# Patient Record
Sex: Male | Born: 1955 | Race: Black or African American | Hispanic: No | Smoking: Former smoker
Health system: Southern US, Community
[De-identification: ages and names within clinical notes are randomized; demographics above are authoritative.]

## PROBLEM LIST (undated history)

## (undated) DIAGNOSIS — J9600 Acute respiratory failure, unspecified whether with hypoxia or hypercapnia: Secondary | ICD-10-CM

## (undated) DIAGNOSIS — I609 Nontraumatic subarachnoid hemorrhage, unspecified: Secondary | ICD-10-CM

## (undated) DIAGNOSIS — F329 Major depressive disorder, single episode, unspecified: Secondary | ICD-10-CM

## (undated) DIAGNOSIS — I639 Cerebral infarction, unspecified: Secondary | ICD-10-CM

## (undated) DIAGNOSIS — F319 Bipolar disorder, unspecified: Secondary | ICD-10-CM

## (undated) DIAGNOSIS — G8929 Other chronic pain: Secondary | ICD-10-CM

## (undated) DIAGNOSIS — E871 Hypo-osmolality and hyponatremia: Secondary | ICD-10-CM

## (undated) DIAGNOSIS — F209 Schizophrenia, unspecified: Secondary | ICD-10-CM

## (undated) DIAGNOSIS — I10 Essential (primary) hypertension: Secondary | ICD-10-CM

## (undated) DIAGNOSIS — I219 Acute myocardial infarction, unspecified: Secondary | ICD-10-CM

## (undated) DIAGNOSIS — R7881 Bacteremia: Secondary | ICD-10-CM

## (undated) HISTORY — PX: CORONARY ANGIOPLASTY WITH STENT PLACEMENT: SHX49

## (undated) HISTORY — PX: KNEE SURGERY: SHX244

---

## 1998-08-19 ENCOUNTER — Emergency Department (HOSPITAL_COMMUNITY): Admission: EM | Admit: 1998-08-19 | Discharge: 1998-08-19 | Payer: Self-pay | Admitting: Emergency Medicine

## 2002-02-24 ENCOUNTER — Emergency Department (HOSPITAL_COMMUNITY): Admission: EM | Admit: 2002-02-24 | Discharge: 2002-02-25 | Payer: Self-pay

## 2002-03-01 ENCOUNTER — Encounter: Payer: Self-pay | Admitting: *Deleted

## 2002-03-01 ENCOUNTER — Emergency Department (HOSPITAL_COMMUNITY): Admission: EM | Admit: 2002-03-01 | Discharge: 2002-03-01 | Payer: Self-pay | Admitting: *Deleted

## 2002-03-18 ENCOUNTER — Emergency Department (HOSPITAL_COMMUNITY): Admission: EM | Admit: 2002-03-18 | Discharge: 2002-03-18 | Payer: Self-pay | Admitting: Emergency Medicine

## 2002-10-29 ENCOUNTER — Emergency Department (HOSPITAL_COMMUNITY): Admission: EM | Admit: 2002-10-29 | Discharge: 2002-10-29 | Payer: Self-pay | Admitting: *Deleted

## 2002-11-29 ENCOUNTER — Inpatient Hospital Stay (HOSPITAL_COMMUNITY): Admission: EM | Admit: 2002-11-29 | Discharge: 2002-11-30 | Payer: Self-pay

## 2003-01-06 ENCOUNTER — Emergency Department (HOSPITAL_COMMUNITY): Admission: EM | Admit: 2003-01-06 | Discharge: 2003-01-06 | Payer: Self-pay | Admitting: *Deleted

## 2003-03-03 ENCOUNTER — Observation Stay (HOSPITAL_COMMUNITY): Admission: EM | Admit: 2003-03-03 | Discharge: 2003-03-04 | Payer: Self-pay | Admitting: Emergency Medicine

## 2003-04-09 ENCOUNTER — Encounter: Payer: Self-pay | Admitting: Emergency Medicine

## 2003-04-10 ENCOUNTER — Inpatient Hospital Stay (HOSPITAL_COMMUNITY): Admission: EM | Admit: 2003-04-10 | Discharge: 2003-04-16 | Payer: Self-pay | Admitting: Psychiatry

## 2003-05-02 ENCOUNTER — Emergency Department (HOSPITAL_COMMUNITY): Admission: EM | Admit: 2003-05-02 | Discharge: 2003-05-02 | Payer: Self-pay | Admitting: Emergency Medicine

## 2003-06-25 ENCOUNTER — Emergency Department (HOSPITAL_COMMUNITY): Admission: EM | Admit: 2003-06-25 | Discharge: 2003-06-25 | Payer: Self-pay | Admitting: Emergency Medicine

## 2003-09-28 ENCOUNTER — Emergency Department (HOSPITAL_COMMUNITY): Admission: EM | Admit: 2003-09-28 | Discharge: 2003-09-28 | Payer: Self-pay | Admitting: Emergency Medicine

## 2003-10-02 ENCOUNTER — Emergency Department (HOSPITAL_COMMUNITY): Admission: EM | Admit: 2003-10-02 | Discharge: 2003-10-02 | Payer: Self-pay | Admitting: *Deleted

## 2011-07-30 ENCOUNTER — Ambulatory Visit (INDEPENDENT_AMBULATORY_CARE_PROVIDER_SITE_OTHER): Payer: Self-pay

## 2011-07-30 ENCOUNTER — Emergency Department (HOSPITAL_COMMUNITY)
Admission: EM | Admit: 2011-07-30 | Discharge: 2011-07-30 | Disposition: A | Discharge status: Home / Self Care | Payer: Medicaid Other | Attending: Emergency Medicine | Admitting: Emergency Medicine

## 2011-07-30 ENCOUNTER — Inpatient Hospital Stay (INDEPENDENT_AMBULATORY_CARE_PROVIDER_SITE_OTHER)
Admission: RE | Admit: 2011-07-30 | Discharge: 2011-07-30 | Disposition: A | Discharge status: Home / Self Care | Payer: Self-pay | Source: Ambulatory Visit | Attending: Emergency Medicine | Admitting: Emergency Medicine

## 2011-07-30 DIAGNOSIS — R071 Chest pain on breathing: Secondary | ICD-10-CM | POA: Insufficient documentation

## 2011-07-30 DIAGNOSIS — R05 Cough: Secondary | ICD-10-CM | POA: Insufficient documentation

## 2011-07-30 DIAGNOSIS — Z7982 Long term (current) use of aspirin: Secondary | ICD-10-CM | POA: Insufficient documentation

## 2011-07-30 DIAGNOSIS — Z8639 Personal history of other endocrine, nutritional and metabolic disease: Secondary | ICD-10-CM | POA: Insufficient documentation

## 2011-07-30 DIAGNOSIS — Z862 Personal history of diseases of the blood and blood-forming organs and certain disorders involving the immune mechanism: Secondary | ICD-10-CM | POA: Insufficient documentation

## 2011-07-30 DIAGNOSIS — R7989 Other specified abnormal findings of blood chemistry: Secondary | ICD-10-CM

## 2011-07-30 DIAGNOSIS — R079 Chest pain, unspecified: Secondary | ICD-10-CM

## 2011-07-30 DIAGNOSIS — R9431 Abnormal electrocardiogram [ECG] [EKG]: Secondary | ICD-10-CM

## 2011-07-30 DIAGNOSIS — I2510 Atherosclerotic heart disease of native coronary artery without angina pectoris: Secondary | ICD-10-CM | POA: Insufficient documentation

## 2011-07-30 DIAGNOSIS — Z79899 Other long term (current) drug therapy: Secondary | ICD-10-CM | POA: Insufficient documentation

## 2011-07-30 DIAGNOSIS — I10 Essential (primary) hypertension: Secondary | ICD-10-CM | POA: Insufficient documentation

## 2011-07-30 DIAGNOSIS — R059 Cough, unspecified: Secondary | ICD-10-CM | POA: Insufficient documentation

## 2011-07-30 DIAGNOSIS — E119 Type 2 diabetes mellitus without complications: Secondary | ICD-10-CM | POA: Insufficient documentation

## 2011-07-30 DIAGNOSIS — I252 Old myocardial infarction: Secondary | ICD-10-CM | POA: Insufficient documentation

## 2011-07-30 LAB — CBC
HCT: 35.6 % — ABNORMAL LOW (ref 39.0–52.0)
Hemoglobin: 12.3 g/dL — ABNORMAL LOW (ref 13.0–17.0)
MCH: 29.6 pg (ref 26.0–34.0)
MCHC: 34.6 g/dL (ref 30.0–36.0)
MCV: 85.6 fL (ref 78.0–100.0)
Platelets: 278 10*3/uL (ref 150–400)
RBC: 4.16 MIL/uL — ABNORMAL LOW (ref 4.22–5.81)
RDW: 13.4 % (ref 11.5–15.5)
WBC: 10.3 10*3/uL (ref 4.0–10.5)

## 2011-07-30 LAB — POCT I-STAT, CHEM 8
BUN: 27 mg/dL — ABNORMAL HIGH (ref 6–23)
Calcium, Ion: 1.19 mmol/L (ref 1.12–1.32)
Chloride: 103 mEq/L (ref 96–112)
Creatinine, Ser: 1.4 mg/dL — ABNORMAL HIGH (ref 0.50–1.35)
Glucose, Bld: 501 mg/dL — ABNORMAL HIGH (ref 70–99)
HCT: 39 % (ref 39.0–52.0)
Hemoglobin: 13.3 g/dL (ref 13.0–17.0)
Potassium: 4.6 mEq/L (ref 3.5–5.1)
Sodium: 136 mEq/L (ref 135–145)
TCO2: 21 mmol/L (ref 0–100)

## 2011-07-30 LAB — GLUCOSE, CAPILLARY: Glucose-Capillary: 361 mg/dL — ABNORMAL HIGH (ref 70–99)

## 2011-08-24 ENCOUNTER — Other Ambulatory Visit: Payer: Self-pay

## 2011-08-24 ENCOUNTER — Emergency Department (HOSPITAL_COMMUNITY)
Admission: EM | Admit: 2011-08-24 | Discharge: 2011-08-24 | Disposition: A | Discharge status: Home / Self Care | Payer: Medicaid Other | Attending: Emergency Medicine | Admitting: Emergency Medicine

## 2011-08-24 ENCOUNTER — Emergency Department (HOSPITAL_COMMUNITY): Payer: Medicaid Other

## 2011-08-24 DIAGNOSIS — M19029 Primary osteoarthritis, unspecified elbow: Secondary | ICD-10-CM | POA: Insufficient documentation

## 2011-08-24 DIAGNOSIS — E119 Type 2 diabetes mellitus without complications: Secondary | ICD-10-CM | POA: Insufficient documentation

## 2011-08-24 DIAGNOSIS — I10 Essential (primary) hypertension: Secondary | ICD-10-CM | POA: Insufficient documentation

## 2011-08-24 DIAGNOSIS — Z9861 Coronary angioplasty status: Secondary | ICD-10-CM | POA: Insufficient documentation

## 2011-08-24 DIAGNOSIS — I252 Old myocardial infarction: Secondary | ICD-10-CM | POA: Insufficient documentation

## 2011-08-24 DIAGNOSIS — G8929 Other chronic pain: Secondary | ICD-10-CM | POA: Insufficient documentation

## 2011-08-24 DIAGNOSIS — Z760 Encounter for issue of repeat prescription: Secondary | ICD-10-CM | POA: Insufficient documentation

## 2011-08-24 DIAGNOSIS — Z794 Long term (current) use of insulin: Secondary | ICD-10-CM | POA: Insufficient documentation

## 2011-08-24 DIAGNOSIS — I519 Heart disease, unspecified: Secondary | ICD-10-CM | POA: Insufficient documentation

## 2011-08-24 DIAGNOSIS — Z7982 Long term (current) use of aspirin: Secondary | ICD-10-CM | POA: Insufficient documentation

## 2011-08-24 HISTORY — DX: Essential (primary) hypertension: I10

## 2011-08-24 HISTORY — DX: Acute myocardial infarction, unspecified: I21.9

## 2011-08-24 HISTORY — DX: Other chronic pain: G89.29

## 2011-08-24 LAB — URINALYSIS, ROUTINE W REFLEX MICROSCOPIC
Bilirubin Urine: NEGATIVE
Glucose, UA: >1000 mg/dL — AB
Ketones, ur: NEGATIVE mg/dL
Leukocytes, UA: NEGATIVE
Nitrite: NEGATIVE
Protein, ur: 30 mg/dL — AB
Specific Gravity, Urine: 1.015 (ref 1.005–1.030)
Urobilinogen, UA: 0.2 mg/dL (ref 0.0–1.0)
pH: 6 (ref 5.0–8.0)

## 2011-08-24 LAB — GLUCOSE, CAPILLARY: Glucose-Capillary: 387 mg/dL — ABNORMAL HIGH (ref 70–99)

## 2011-08-24 LAB — POCT I-STAT TROPONIN I: Troponin i, poc: 0.02 ng/mL (ref 0.00–0.08)

## 2011-08-24 LAB — URINE MICROSCOPIC-ADD ON

## 2011-08-24 MED ORDER — HYDROCHLOROTHIAZIDE 25 MG PO TABS: 25.0000 mg | ORAL_TABLET | Freq: Every day | ORAL | Status: DC

## 2011-08-24 MED ORDER — INDOMETHACIN 25 MG PO CAPS: ORAL_CAPSULE | ORAL | Status: DC

## 2011-08-24 MED ORDER — METFORMIN HCL 500 MG PO TABS: 500.0000 mg | ORAL_TABLET | Freq: Two times a day (BID) | ORAL | Status: DC

## 2011-08-24 NOTE — ED Notes (Signed)
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

## 2011-08-24 NOTE — ED Provider Notes (Signed)
History     CSN: [id number] Arrival date & time: 08/24/2011  3:58 PM    Chief Complaint  Patient presents with  . Medication Refill  . Hypertension  . Elbow Pain   HPI Pt was seen at 1620.  Per pt, c/o gradual onset and persistence of constant need for medication refill for the past month.  Pt states he was seen at [facility] ED 1 month ago for same.  States he ran out of his HTN and DM meds.  States he has not tried to find a doctor "because I'm waiting for my medicaid and disability."  Also c/o gradual onset and persistence of constant acute flair of his chronic right elbow "pain" for the past several days.  Pt describes the pain as "my gout pain."  Describes the pain as no different from his usual chronic pain pattern.  Denies injury, no rash, no swelling, no fever, no focal motor weakness, no tingling/numbness in extremities, no CP/SOB, no abd pain, no back pain, no N/V/D, no visual changes.      Past Medical History  Diagnosis Date  . Diabetes mellitus   . Hypertension   . Gout   . MI (myocardial infarction)   . Chronic pain     right elbow    Past Surgical History  Procedure Date  . Coronary angioplasty with stent placement   . Knee surgery     Social History  . Marital Status: Married   Social History Main Topics  . Smoking status: Never Smoker   . Smokeless tobacco: None  . Alcohol Use: No  . Drug Use: No    Review of Systems ROS: Statement: All systems negative except as marked or noted in the HPI; Constitutional: Negative for fever and chills. ; ; Eyes: Negative for eye pain, redness and discharge. ; ; ENMT: Negative for ear pain, hoarseness, nasal congestion, sinus pressure and sore throat. ; ; Cardiovascular: Negative for chest pain, palpitations, diaphoresis, dyspnea and peripheral edema. ; ; Respiratory: Negative for cough, wheezing and stridor. ; ; Gastrointestinal: Negative for nausea, vomiting, diarrhea and abdominal pain, blood in stool, hematemesis, jaundice and  rectal bleeding. . ; ; Genitourinary: Negative for dysuria, flank pain and hematuria. ; ; Musculoskeletal: Negative for back pain and neck pain. Negative for swelling and trauma. +right elbow pain; ; Skin: Negative for pruritus, rash, abrasions, blisters, bruising and skin lesion.; ; Neuro: Negative for headache, lightheadedness and neck stiffness. Negative for weakness, altered level of consciousness , altered mental status, extremity weakness, paresthesias, involuntary movement, seizure and syncope.     Allergies  Review of patient's allergies indicates no known allergies.  Home Medications   Current Outpatient Rx  Name Route Sig Dispense Refill  . ASPIRIN EC 81 MG PO TBEC Oral Take 81 mg by mouth daily.      . ATENOLOL 50 MG PO TABS Oral Take 50 mg by mouth daily.      . ENALAPRIL MALEATE 20 MG PO TABS Oral Take 20 mg by mouth daily.      [name] HYDROCHLOROTHIAZIDE 25 MG PO TABS Oral Take 25 mg by mouth daily.      . IBUPROFEN 800 MG PO TABS Oral Take 800 mg by mouth 3 (three) times daily.      . INSULIN ISOPHANE HUMAN 100 UNIT/ML [place] SUSP Subcutaneous Inject 10 Units into the skin every morning.      . INSULIN REGULAR HUMAN 100 UNIT/ML IJ SOLN Subcutaneous Inject 10 Units into the skin  every morning.      [name] METFORMIN HCL 1000 MG PO TABS Oral Take 1,000 mg by mouth 2 (two) times daily.      [name] NITROGLYCERIN 0.4 MG SL SUBL Sublingual Place 0.4 mg under the tongue every 5 (five) minutes as needed. Chest pain x3 doses     . HYDROCHLOROTHIAZIDE 25 MG PO TABS Oral Take 1 tablet (25 mg total) by mouth daily. 15 tablet 0  . INDOMETHACIN 25 MG PO CAPS  2 tabs (50 mg) PO TID x2 days, then 1 tab (25mg ) PO TID x3 days 21 capsule 0  . METFORMIN HCL 500 MG PO TABS Oral Take 1 tablet (500 mg total) by mouth 2 (two) times daily with a meal. 15 tablet 0     BP 166/91  Pulse 83  Temp(Src) 98.4 F (36.9 C) (Oral)  Resp 21  Ht 5\' 6"  (1.676 m)  Wt 178 lb 7 oz (80.939 kg)  BMI 28.80 kg/m2  SpO2  96%  Physical Exam 1625: Physical examination:  Nursing notes reviewed; Vital signs and O2 SAT reviewed;  Constitutional: Well developed, Well nourished, Well hydrated, In no acute distress; Head:  Normocephalic, atraumatic; Eyes: EOMI, PERRL, No scleral icterus; ENMT: Mouth and pharynx normal, Mucous membranes moist; Neck: Supple, Full range of motion, No lymphadenopathy; Cardiovascular: Regular rate and rhythm, No murmur, rub, or gallop; Respiratory: Breath sounds clear & equal bilaterally, No rales, rhonchi, wheezes, or rub, Normal respiratory effort/excursion; Chest: Nontender, Movement normal; Abdomen: Soft, Nontender, Nondistended, Normal bowel sounds; Extremities: Pulses normal, No deformity, No edema, No calf edema or asymmetry. +right elbow with generalized TTP, no edema or erythema, NMS intact right hand, strong radial pulse.; Neuro: AA&Ox3, Major CN grossly intact.  No gross focal motor or sensory deficits in extremities.; Skin: Color normal, Warm, Dry, no rash.    ED Course  Procedures   1630:  Echart/EPIC chart review reveals pt has been eval at [facility] ED and [facility] since 2004, most recently on Jul 30, 2011.  Pt apparently was in prison from 1997-2004.  Pt confronted with this information, as he told the ED RN that he "just got out of prison after 20 years being in" on 08/10/11.  States he "just hasn't found a doctor" and "needs some meds" today.  Will check for end organ damage today.   MDM  MDM Reviewed: previous chart, nursing note and vitals Reviewed previous: ECG Interpretation: ECG, x-ray and labs   Date: 08/24/2011  Rate: 78  Rhythm: normal sinus rhythm  QRS Axis: normal  Intervals: normal  ST/T Wave abnormalities: nonspecific ST/T changes,   Conduction Disutrbances:none  Narrative Interpretation: LVH with flipped T-waves in inf-lat leads.  Old EKG Reviewed: unchanged, no significant changes from previous EKG dated 07/30/2011.  [name] Results for orders placed during the hospital  encounter of 08/24/11  GLUCOSE, CAPILLARY      Component Value Range   Glucose-Capillary 387 (*) 70 - 99 (mg/dL)  URINALYSIS, ROUTINE W REFLEX MICROSCOPIC      Component Value Range   Color, Urine YELLOW  YELLOW    Appearance CLEAR  CLEAR    Specific Gravity, Urine 1.015  1.005 - 1.030    pH 6.0  5.0 - 8.0    Glucose, UA >1000 (*) NEGATIVE (mg/dL)   Hgb urine dipstick TRACE (*) NEGATIVE    Bilirubin Urine NEGATIVE  NEGATIVE    Ketones, ur NEGATIVE  NEGATIVE (mg/dL)   Protein, ur 30 (*) NEGATIVE (mg/dL)   Urobilinogen, UA 0.2  0.0 - 1.0 (mg/dL)   Nitrite NEGATIVE  NEGATIVE    Leukocytes, UA NEGATIVE  NEGATIVE   URINE MICROSCOPIC-ADD ON      Component Value Range   WBC, UA 3-6  <3 (WBC/hpf)   RBC / HPF 3-6  <3 (RBC/hpf)  POCT I-STAT TROPONIN I      Component Value Range   Troponin i, poc 0.02  0.00 - 0.08 (ng/mL)   Comment 3            I-stat chem did not cross over into EPIC:  Na 136, K 4.2, Cl 103, iCa 1.16, CO2 23, Glu 336, RUN 26, Cr 0.9, Hgb 13.3, Hct 39.   Dg Chest 2 View  08/24/2011  *RADIOLOGY REPORT*  Clinical Data: Cardiac disease.  CHEST - 2 VIEW  Comparison: 07/30/2011  Findings: There is left ventricular prominence.  Mediastinal shadows are normal.  The lungs are clear.  The vascularity is normal.  No effusions.  Ordinary degenerative changes effect the spine.  IMPRESSION: No active disease.  Left ventricular prominence.  Original Report Authenticated By: Thomasenia Sales, M.D.   Dg Elbow Complete Right  08/24/2011  *RADIOLOGY REPORT*  Clinical Data: Chronic pain.  History of gout.  RIGHT ELBOW - COMPLETE 3+ VIEW  Comparison: None  Findings:  The patient does have a joint effusion.  There is degenerative arthritis with osteophyte formation at the articulation of the humerus and ulna.  I do not see any  erosions to allow diagnosis of gout.  No evidence of fracture.  Impression: Joint effusion.  Osteoarthritic changes.  No findings specific of gout.  Original Report  Authenticated By: Thomasenia Sales, M.D.    6:55 PM:  AG 10, not acidotic.  Known DM.  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  Wants to go home now.  Will refill HCTZ and metformin.  Dx testing d/w pt.  Questions answered.  Verb understanding, agreeable to d/c home with outpt f/u.   Annsleigh Dragoo Allison Quarry, DO 08/26/11 1259

## 2011-08-24 NOTE — ED Notes (Signed)
Advised pt of wait, pt given sprite and peanut butter crackers

## 2011-08-24 NOTE — ED Notes (Signed)
Pt presents with multiple complaints. Pt was released from prison after 20 yrs on 08/10/2011. Pt was on multiple medications in prison and has not been able to obtain medications outside. Pt presents with right elbow pain, BG 387, hypertension, and headache. Pt is currently trying to start medicaid and disability. NAD at this time.

## 2011-08-25 LAB — URINE CULTURE: Colony Count: NO GROWTH

## 2011-08-25 LAB — POCT I-STAT, CHEM 8
Creatinine, Ser: 0.9 mg/dL (ref 0.50–1.35)
HCT: 39 % (ref 39.0–52.0)
Hemoglobin: 13.3 g/dL (ref 13.0–17.0)
Potassium: 4.2 mEq/L (ref 3.5–5.1)
Sodium: 136 mEq/L (ref 135–145)

## 2011-12-08 ENCOUNTER — Emergency Department (HOSPITAL_COMMUNITY): Payer: Medicaid Other

## 2011-12-08 ENCOUNTER — Other Ambulatory Visit: Payer: Self-pay

## 2011-12-08 ENCOUNTER — Inpatient Hospital Stay (HOSPITAL_COMMUNITY)
Admission: EM | Admit: 2011-12-08 | Discharge: 2011-12-12 | DRG: 638 | Disposition: A | Discharge status: Home / Self Care | Payer: Medicaid Other | Attending: Internal Medicine | Admitting: Internal Medicine

## 2011-12-08 ENCOUNTER — Encounter (HOSPITAL_COMMUNITY): Payer: Self-pay | Admitting: Emergency Medicine

## 2011-12-08 DIAGNOSIS — I252 Old myocardial infarction: Secondary | ICD-10-CM

## 2011-12-08 DIAGNOSIS — E1100 Type 2 diabetes mellitus with hyperosmolarity without nonketotic hyperglycemic-hyperosmolar coma (NKHHC): Principal | ICD-10-CM | POA: Diagnosis present

## 2011-12-08 DIAGNOSIS — Z9119 Patient's noncompliance with other medical treatment and regimen: Secondary | ICD-10-CM

## 2011-12-08 DIAGNOSIS — R112 Nausea with vomiting, unspecified: Secondary | ICD-10-CM | POA: Diagnosis present

## 2011-12-08 DIAGNOSIS — E119 Type 2 diabetes mellitus without complications: Secondary | ICD-10-CM | POA: Diagnosis present

## 2011-12-08 DIAGNOSIS — I2510 Atherosclerotic heart disease of native coronary artery without angina pectoris: Secondary | ICD-10-CM | POA: Diagnosis present

## 2011-12-08 DIAGNOSIS — G8929 Other chronic pain: Secondary | ICD-10-CM

## 2011-12-08 DIAGNOSIS — R739 Hyperglycemia, unspecified: Secondary | ICD-10-CM

## 2011-12-08 DIAGNOSIS — I10 Essential (primary) hypertension: Secondary | ICD-10-CM

## 2011-12-08 DIAGNOSIS — F32A Depression, unspecified: Secondary | ICD-10-CM

## 2011-12-08 DIAGNOSIS — M109 Gout, unspecified: Secondary | ICD-10-CM

## 2011-12-08 DIAGNOSIS — Z91199 Patient's noncompliance with other medical treatment and regimen due to unspecified reason: Secondary | ICD-10-CM

## 2011-12-08 DIAGNOSIS — R079 Chest pain, unspecified: Secondary | ICD-10-CM | POA: Diagnosis present

## 2011-12-08 DIAGNOSIS — Z5900 Homelessness unspecified: Secondary | ICD-10-CM

## 2011-12-08 DIAGNOSIS — Z792 Long term (current) use of antibiotics: Secondary | ICD-10-CM

## 2011-12-08 DIAGNOSIS — R4182 Altered mental status, unspecified: Secondary | ICD-10-CM | POA: Diagnosis present

## 2011-12-08 DIAGNOSIS — R109 Unspecified abdominal pain: Secondary | ICD-10-CM | POA: Diagnosis present

## 2011-12-08 DIAGNOSIS — F329 Major depressive disorder, single episode, unspecified: Secondary | ICD-10-CM | POA: Diagnosis present

## 2011-12-08 DIAGNOSIS — Z87891 Personal history of nicotine dependence: Secondary | ICD-10-CM

## 2011-12-08 HISTORY — DX: Major depressive disorder, single episode, unspecified: F32.9

## 2011-12-08 LAB — URINALYSIS, ROUTINE W REFLEX MICROSCOPIC
Bilirubin Urine: NEGATIVE
Glucose, UA: >1000 mg/dL — AB
Hgb urine dipstick: NEGATIVE
Ketones, ur: NEGATIVE mg/dL
Leukocytes, UA: NEGATIVE
Nitrite: NEGATIVE
Protein, ur: 30 mg/dL — AB
Specific Gravity, Urine: 1.03 (ref 1.005–1.030)
Urobilinogen, UA: 1 mg/dL (ref 0.0–1.0)
pH: 6 (ref 5.0–8.0)

## 2011-12-08 LAB — COMPREHENSIVE METABOLIC PANEL
ALT: 16 U/L (ref 0–53)
Alkaline Phosphatase: 72 U/L (ref 39–117)
BUN: 19 mg/dL (ref 6–23)
Chloride: 93 mEq/L — ABNORMAL LOW (ref 96–112)
GFR calc Af Amer: 84 mL/min — ABNORMAL LOW (ref >90)
Glucose, Bld: 461 mg/dL — ABNORMAL HIGH (ref 70–99)
Potassium: 4.1 mEq/L (ref 3.5–5.1)
Sodium: 127 mEq/L — ABNORMAL LOW (ref 135–145)
Total Bilirubin: 0.5 mg/dL (ref 0.3–1.2)

## 2011-12-08 LAB — BLOOD GAS, ARTERIAL
Bicarbonate: 20.8 mEq/L (ref 20.0–24.0)
Drawn by: 270211
O2 Content: 2 L/min
Patient temperature: 98.6
pH, Arterial: 7.383 (ref 7.350–7.450)
pO2, Arterial: 112 mmHg — ABNORMAL HIGH (ref 80.0–100.0)

## 2011-12-08 LAB — DIFFERENTIAL
Basophils Absolute: 0 K/uL (ref 0.0–0.1)
Basophils Relative: 0 % (ref 0–1)
Eosinophils Absolute: 0 K/uL (ref 0.0–0.7)
Eosinophils Relative: 0 % (ref 0–5)
Lymphocytes Relative: 22 % (ref 12–46)
Lymphs Abs: 2.1 K/uL (ref 0.7–4.0)
Monocytes Absolute: 0.9 K/uL (ref 0.1–1.0)
Monocytes Relative: 10 % (ref 3–12)
Neutro Abs: 6.6 K/uL (ref 1.7–7.7)
Neutrophils Relative %: 68 % (ref 43–77)

## 2011-12-08 LAB — ETHANOL: Alcohol, Ethyl (B): <11 mg/dL (ref 0–11)

## 2011-12-08 LAB — GLUCOSE, CAPILLARY: Glucose-Capillary: 421 mg/dL — ABNORMAL HIGH (ref 70–99)

## 2011-12-08 LAB — RAPID URINE DRUG SCREEN, HOSP PERFORMED: Opiates: NOT DETECTED

## 2011-12-08 LAB — CBC
Hemoglobin: 11.7 g/dL — ABNORMAL LOW (ref 13.0–17.0)
Platelets: 246 10*3/uL (ref 150–400)
RBC: 4.27 MIL/uL (ref 4.22–5.81)
WBC: 9.6 10*3/uL (ref 4.0–10.5)

## 2011-12-08 LAB — PROTIME-INR: Prothrombin Time: 14.2 seconds (ref 11.6–15.2)

## 2011-12-08 LAB — CARDIAC PANEL(CRET KIN+CKTOT+MB+TROPI): Total CK: 667 U/L — ABNORMAL HIGH (ref 7–232)

## 2011-12-08 LAB — AMMONIA: Ammonia: 23 umol/L (ref 11–60)

## 2011-12-08 LAB — URINE MICROSCOPIC-ADD ON

## 2011-12-08 LAB — POCT I-STAT TROPONIN I

## 2011-12-08 MED ORDER — INSULIN ASPART 100 UNIT/ML ~~LOC~~ SOLN
0.0000 [IU] | Freq: Three times a day (TID) | SUBCUTANEOUS | Status: DC
  Filled 2011-12-08: qty 3

## 2011-12-08 MED ORDER — INSULIN ASPART 100 UNIT/ML ~~LOC~~ SOLN
5.0000 [IU] | Freq: Once | SUBCUTANEOUS | Status: AC
  Administered 2011-12-08: 5 [IU] via SUBCUTANEOUS

## 2011-12-08 MED ORDER — INSULIN ASPART 100 UNIT/ML ~~LOC~~ SOLN
0.0000 [IU] | Freq: Every day | SUBCUTANEOUS | Status: DC
  Administered 2011-12-10: 5 [IU] via SUBCUTANEOUS

## 2011-12-08 MED ORDER — SODIUM CHLORIDE 0.9 % IV BOLUS (SEPSIS)
1000.0000 mL | Freq: Once | INTRAVENOUS | Status: AC
  Administered 2011-12-08: 1000 mL via INTRAVENOUS

## 2011-12-08 MED ORDER — ASPIRIN 81 MG PO CHEW
324.0000 mg | CHEWABLE_TABLET | Freq: Once | ORAL | Status: AC
  Administered 2011-12-08: 324 mg via ORAL
  Filled 2011-12-08: qty 4

## 2011-12-08 MED ORDER — INDOMETHACIN 50 MG PO CAPS
50.0000 mg | ORAL_CAPSULE | Freq: Three times a day (TID) | ORAL | Status: DC | PRN
  Administered 2011-12-08 – 2011-12-11 (×4): 50 mg via ORAL
  Filled 2011-12-08 (×4): qty 1

## 2011-12-08 MED ORDER — SODIUM CHLORIDE 0.9 % IJ SOLN
3.0000 mL | Freq: Two times a day (BID) | INTRAMUSCULAR | Status: DC
  Administered 2011-12-09 – 2011-12-11 (×6): 3 mL via INTRAVENOUS
  Administered 2011-12-12: 11:00:00 via INTRAVENOUS

## 2011-12-08 MED ORDER — INSULIN ASPART PROT & ASPART (70-30 MIX) 100 UNIT/ML ~~LOC~~ SUSP
10.0000 [IU] | Freq: Once | SUBCUTANEOUS | Status: AC
  Administered 2011-12-08: 10 [IU] via SUBCUTANEOUS
  Filled 2011-12-08: qty 3

## 2011-12-08 MED ORDER — ASPIRIN EC 81 MG PO TBEC
81.0000 mg | DELAYED_RELEASE_TABLET | Freq: Every day | ORAL | Status: DC
  Administered 2011-12-08 – 2011-12-12 (×5): 81 mg via ORAL
  Filled 2011-12-08 (×6): qty 1

## 2011-12-08 MED ORDER — SODIUM CHLORIDE 0.9 % IV SOLN: Freq: Once | INTRAVENOUS | Status: AC

## 2011-12-08 MED ORDER — ENOXAPARIN SODIUM 40 MG/0.4ML ~~LOC~~ SOLN
40.0000 mg | SUBCUTANEOUS | Status: DC
  Administered 2011-12-08 – 2011-12-11 (×4): 40 mg via SUBCUTANEOUS
  Filled 2011-12-08 (×6): qty 0.4

## 2011-12-08 MED ORDER — ONDANSETRON HCL 4 MG PO TABS
4.0000 mg | ORAL_TABLET | Freq: Four times a day (QID) | ORAL | Status: DC | PRN
  Filled 2011-12-08: qty 1

## 2011-12-08 MED ORDER — INSULIN ASPART 100 UNIT/ML ~~LOC~~ SOLN
0.0000 [IU] | Freq: Three times a day (TID) | SUBCUTANEOUS | Status: DC
  Administered 2011-12-09: 9 [IU] via SUBCUTANEOUS
  Administered 2011-12-09: 7 [IU] via SUBCUTANEOUS
  Administered 2011-12-09: 4 [IU] via SUBCUTANEOUS
  Administered 2011-12-10: 3 [IU] via SUBCUTANEOUS
  Administered 2011-12-10: 9 [IU] via SUBCUTANEOUS
  Administered 2011-12-10: 5 [IU] via SUBCUTANEOUS
  Administered 2011-12-11: 3 [IU] via SUBCUTANEOUS
  Administered 2011-12-11: 7 [IU] via SUBCUTANEOUS
  Filled 2011-12-08: qty 3

## 2011-12-08 MED ORDER — SODIUM CHLORIDE 0.9 % IV SOLN
Freq: Once | INTRAVENOUS | Status: AC
  Administered 2011-12-08: 16:00:00 via INTRAVENOUS

## 2011-12-08 MED ORDER — POTASSIUM CHLORIDE IN NACL 20-0.9 MEQ/L-% IV SOLN
INTRAVENOUS | Status: AC
  Administered 2011-12-08: 23:00:00 via INTRAVENOUS
  Filled 2011-12-08: qty 1000

## 2011-12-08 MED ORDER — ONDANSETRON HCL 4 MG/2ML IJ SOLN: 4.0000 mg | Freq: Four times a day (QID) | INTRAMUSCULAR | Status: DC | PRN

## 2011-12-08 MED ORDER — ALUM & MAG HYDROXIDE-SIMETH 200-200-20 MG/5ML PO SUSP: 30.0000 mL | Freq: Four times a day (QID) | ORAL | Status: DC | PRN

## 2011-12-08 NOTE — H&P (Signed)
Chief Complaint:  Chest pain  HPI: 56 year old male who just got of jail in September and is homeless presents to the emergency department because of a gout flare, chest pain, has been out of medications for months and now has uncontrolled glucose. He is a known diabetic and has a history of depression. He denies any suicidal or homicidal deviations. He normally has gout flares in his left elbow and ankles. For several days his left elbow has been swollen and painful along with his right ankle. He denies running any fevers. He denies drinking any alcohol or do any other drugs however he seems very intoxicated right now. He says he does not ever drink alcohol. He denies any abdominal pain but has been having some atypical chest pain since yesterday. He has known history of coronary artery disease with stent placement in the past. He does quit taking his medications over the last several months he does not have a primary care physician and is basically homeless. He had to sleep in his truck last night. He denies any fevers he did have one episode of vomiting today no diarrhea no shortness of breath. No cough.  Review of Systems:  Otherwise negative  Past Medical History: Past Medical History  Diagnosis Date  . Diabetes mellitus   . Hypertension   . Gout   . MI (myocardial infarction)   . Chronic pain     right elbow  . Major depression, chronic    Past Surgical History  Procedure Date  . Coronary angioplasty with stent placement   . Knee surgery     Medications: Prior to Admission medications   Medication Sig Start Date End Date Taking? Authorizing Provider  aspirin EC 81 MG tablet Take 81 mg by mouth daily.      Historical Provider, MD  atenolol (TENORMIN) 50 MG tablet Take 50 mg by mouth daily.      Historical Provider, MD  enalapril (VASOTEC) 20 MG tablet Take 20 mg by mouth daily.      Historical Provider, MD  hydrochlorothiazide (HYDRODIURIL) 25 MG tablet Take 25 mg by mouth  daily.      Historical Provider, MD  hydrochlorothiazide (HYDRODIURIL) 25 MG tablet Take 1 tablet (25 mg total) by mouth daily. 08/24/11 08/23/12  Laray Anger, DO  ibuprofen (ADVIL,MOTRIN) 800 MG tablet Take 800 mg by mouth 3 (three) times daily.      Historical Provider, MD  indomethacin (INDOCIN) 25 MG capsule 2 tabs (50 mg) PO TID x2 days, then 1 tab (25mg ) PO TID x3 days 08/24/11   Laray Anger, DO  insulin NPH (HUMULIN N,NOVOLIN N) 100 UNIT/ML injection Inject 10 Units into the skin every morning.      Historical Provider, MD  insulin regular (HUMULIN R,NOVOLIN R) 100 units/mL injection Inject 10 Units into the skin every morning.      Historical Provider, MD  metFORMIN (GLUCOPHAGE) 1000 MG tablet Take 1,000 mg by mouth 2 (two) times daily.      Historical Provider, MD  metFORMIN (GLUCOPHAGE) 500 MG tablet Take 1 tablet (500 mg total) by mouth 2 (two) times daily with a meal. 08/24/11 08/23/12  Laray Anger, DO  nitroGLYCERIN (NITROSTAT) 0.4 MG SL tablet Place 0.4 mg under the tongue every 5 (five) minutes as needed. Chest pain x3 doses     Historical Provider, MD    Allergies:  No Known Allergies  Social History:  reports that he has quit smoking. He has never used smokeless  tobacco. He reports that he does not drink alcohol or use illicit drugs.  Family History: History reviewed. No pertinent family history.  Physical Exam: Filed Vitals:   12/08/11 1317 12/08/11 1654  BP: 146/77 137/76  Pulse: 99 83  Temp: 99.7 F (37.6 C) 99.1 F (37.3 C)  TempSrc: Oral Oral  SpO2: 98% 100%  BP 137/76  Pulse 83  Temp(Src) 99.1 F (37.3 C) (Oral)  SpO2 100% General appearance: cooperative, no distress and slowed mentation Lungs: clear to auscultation bilaterally Heart: regular rate and rhythm, S1, S2 normal, no murmur, click, rub or gallop Abdomen: soft, non-tender; bowel sounds normal; no masses,  no organomegaly Extremities: extremities normal, atraumatic, no cyanosis or  edema Pulses: 2+ and symmetric Skin: Skin color, texture, turgor normal. No rashes or lesions Neurologic: Grossly normal mentation is slow patient seems sedated or intoxicated he rouses easily to voice but frequently falling asleep during history   Labs on Admission:   [facility] 12/08/11 1315  NA 127*  K 4.1  CL 93*  CO2 19  GLUCOSE 461*  BUN 19  CREATININE 1.11  CALCIUM 8.5  MG --  PHOS --    Basename 12/08/11 1315  AST 25  ALT 16  ALKPHOS 72  BILITOT 0.5  PROT 7.5  ALBUMIN 3.3*    Basename 12/08/11 1315  WBC 9.6  NEUTROABS 6.6  HGB 11.7*  HCT 35.1*  MCV 82.2  PLT 246    Radiological Exams on Admission: Dg Chest 2 View  12/08/2011  *RADIOLOGY REPORT*  Clinical Data: Hyperglycemia.  Chest pain.  Short of breath.  CHEST - 2 VIEW  Comparison: 08/24/2011.  Findings:  Cardiopericardial silhouette within normal limits. Mediastinal contours normal. Trachea midline.  No airspace disease or effusion. Monitoring leads are projected over the chest.  IMPRESSION: No acute cardiopulmonary disease.  Original Report Authenticated By: Andreas Newport, M.D.    Assessment/Plan Present on Admission:  56 year old male with atypical chest pain, probable gout, noncompliance with uncontrolled diabetes who is in not in DKA and major depression with altered mental status.  .Diabetes mellitus .CAD (coronary artery disease) .Chest pain .Nausea & vomiting .Major depression, chronic .Chronic pain .Hypertension .Gout .Altered mental status  For his altered mental status is unclear as to what's going on. His urinalysis is pending he is not acidotic. I'm going to check a stat CT of his head. Check ammonia level. Check a urine drug screen. Check ABG. For his diabetes we'll place him on sliding scale insulin and IV fluids. For his chest pain which is currently resolved will serial his cardiac enzymes and check a 2-D echo. We'll check hemoglobin A1c. Provide some Zofran as needed for nausea and  vomiting. Hold off on treating with colchicine and check uric acid level for his joint pain and provide him some NSAIDs for now. He has major depression but denies any suicidal deviations he will need close psychiatry followup at discharge. We'll obtain frequent neurological checks. He is a full code. He currently has no focal neurologic deficits except the mental status changes.  Kately Graffam A [phone] 12/08/2011, 5:49 PM

## 2011-12-08 NOTE — ED Notes (Signed)
WUJ:[id number]<JY> Expected date:12/08/11<BR> Expected time:12:15 PM<BR> Means of arrival:Ambulance<BR> Comments:<BR> M11. Male from [place]. Dr Debroah Baller for eval CBG=over 500. 10-15 mins

## 2011-12-08 NOTE — ED Notes (Signed)
Two previous suicide attempts while incarcerated, "tried to cut throat w/ shank" guard caught me twice

## 2011-12-08 NOTE — ED Notes (Signed)
Presents w/ elevated blood sugar from [place]. Pt flagged [employer] down who transported to [facility]. Pt told police "hearing voices and chronically depressed". Hx of SI, recent release from prison. CBG at [facility] 525

## 2011-12-08 NOTE — ED Provider Notes (Signed)
History     CSN: [id number]  Arrival date & time 12/08/11  1244   First MD Initiated Contact with Patient 12/08/11 1302      Chief Complaint  Patient presents with  . Hyperglycemia    (Consider location/radiation/quality/duration/timing/severity/associated sxs/prior treatment) Patient is a 56 y.o. male presenting with weakness. The history is provided by the patient.  Weakness Primary symptoms do not include dizziness, fever, nausea or vomiting.  Pt states he has severe depression, chronic pain from gout in left elbow and left foot, no primary care doctor. States was released from prison in September, he has no insurance, has not followed up. He is diabetic, not taking his medications because does not have any. States today was not feeling well, feels like depression got worse, went to a police station and told them what is going on. He was taken to [facility] where they found his CBG to be 525. Pt was sent here. Pt also reports chest pain on the off since yesterday, hx of MIs.   Past Medical History  Diagnosis Date  . Diabetes mellitus   . Hypertension   . Gout   . MI (myocardial infarction)   . Chronic pain     right elbow  . Major depression, chronic     Past Surgical History  Procedure Date  . Coronary angioplasty with stent placement   . Knee surgery     No family history on file.  History  Substance Use Topics  . Smoking status: Never Smoker   . Smokeless tobacco: Not on file  . Alcohol Use: No      Review of Systems  Constitutional: Negative for fever and chills.  HENT: Negative.   Eyes: Negative.   Respiratory: Positive for chest tightness. Negative for cough and shortness of breath.   Cardiovascular: Positive for chest pain. Negative for palpitations and leg swelling.  Gastrointestinal: Negative.  Negative for nausea and vomiting.  Genitourinary: Negative.   Musculoskeletal: Positive for joint swelling and arthralgias.  Skin: Positive for color change.    Neurological: Positive for weakness. Negative for dizziness and numbness.  Psychiatric/Behavioral:       Depression    Allergies  Review of patient's allergies indicates no known allergies.  Home Medications   Current Outpatient Rx  Name Route Sig Dispense Refill  . ASPIRIN EC 81 MG PO TBEC Oral Take 81 mg by mouth daily.      . ATENOLOL 50 MG PO TABS Oral Take 50 mg by mouth daily.      . ENALAPRIL MALEATE 20 MG PO TABS Oral Take 20 mg by mouth daily.      [name] HYDROCHLOROTHIAZIDE 25 MG PO TABS Oral Take 25 mg by mouth daily.      [name] HYDROCHLOROTHIAZIDE 25 MG PO TABS Oral Take 1 tablet (25 mg total) by mouth daily. 15 tablet 0  . IBUPROFEN 800 MG PO TABS Oral Take 800 mg by mouth 3 (three) times daily.      . INDOMETHACIN 25 MG PO CAPS  2 tabs (50 mg) PO TID x2 days, then 1 tab (25mg ) PO TID x3 days 21 capsule 0  . INSULIN ISOPHANE HUMAN 100 UNIT/ML [place] SUSP Subcutaneous Inject 10 Units into the skin every morning.      . INSULIN REGULAR HUMAN 100 UNIT/ML IJ SOLN Subcutaneous Inject 10 Units into the skin every morning.      [name] METFORMIN HCL 1000 MG PO TABS Oral Take 1,000 mg by mouth 2 (two) times  daily.      [name] METFORMIN HCL 500 MG PO TABS Oral Take 1 tablet (500 mg total) by mouth 2 (two) times daily with a meal. 15 tablet 0  . NITROGLYCERIN 0.4 MG SL SUBL Sublingual Place 0.4 mg under the tongue every 5 (five) minutes as needed. Chest pain x3 doses       BP 146/77  Pulse 99  Temp(Src) 99.7 F (37.6 C) (Oral)  SpO2 98%  Physical Exam  Nursing note and vitals reviewed. Constitutional: He is oriented to person, place, and time. He appears well-developed and well-nourished.       solmonolent  HENT:  Head: Normocephalic and atraumatic.  Eyes: Conjunctivae are normal.  Neck: Neck supple.  Cardiovascular: Normal rate, regular rhythm and normal heart sounds.   Pulmonary/Chest: Effort normal and breath sounds normal. No respiratory distress. He has no wheezes. He has no rales.   Abdominal: Soft. Bowel sounds are normal. He exhibits no distension. There is no tenderness.  Musculoskeletal: He exhibits tenderness.       Swelling to the left elbow, warm, red. Pain with palpation, pain with ROM.   Neurological: He is alert and oriented to person, place, and time.  Skin: Skin is warm and dry.  Psychiatric:       depressed    ED Course  Procedures (including critical care time)  Labs Reviewed  CBC - Abnormal; Notable for the following:    Hemoglobin 11.7 (*)    HCT 35.1 (*)    All other components within normal limits  GLUCOSE, CAPILLARY - Abnormal; Notable for the following:    Glucose-Capillary 476 (*)    All other components within normal limits  DIFFERENTIAL  COMPREHENSIVE METABOLIC PANEL  ETHANOL  URINE RAPID DRUG SCREEN (HOSP PERFORMED)  URINALYSIS, ROUTINE W REFLEX MICROSCOPIC   Pt hyperglycemic, complaining of chest pain, depression, weakness. Pt has spent the night in the car last night (26degrees outside). Pt has no PCP or medications. Will get labs, CXR, monitor. Fluids and insulin started.   Date: 12/08/2011  Rate: 99  Rhythm: normal sinus rhythm  QRS Axis: normal  Intervals: normal  ST/T Wave abnormalities: normal  Conduction Disutrbances:none  Narrative Interpretation:   Old EKG Reviewed: changes noted T waves in lead 3 now upright when compared to 08/23/11  Dr. Fonnie Jarvis to call admission.   Results for orders placed during the hospital encounter of 12/08/11  CBC      Component Value Range   WBC 9.6  4.0 - 10.5 (K/uL)   RBC 4.27  4.22 - 5.81 (MIL/uL)   Hemoglobin 11.7 (*) 13.0 - 17.0 (g/dL)   HCT 91.4 (*) 78.2 - 52.0 (%)   MCV 82.2  78.0 - 100.0 (fL)   MCH 27.4  26.0 - 34.0 (pg)   MCHC 33.3  30.0 - 36.0 (g/dL)   RDW 95.6  21.3 - 08.6 (%)   Platelets 246  150 - 400 (K/uL)  DIFFERENTIAL      Component Value Range   Neutrophils Relative 68  43 - 77 (%)   Neutro Abs 6.6  1.7 - 7.7 (K/uL)   Lymphocytes Relative 22  12 - 46 (%)    Lymphs Abs 2.1  0.7 - 4.0 (K/uL)   Monocytes Relative 10  3 - 12 (%)   Monocytes Absolute 0.9  0.1 - 1.0 (K/uL)   Eosinophils Relative 0  0 - 5 (%)   Eosinophils Absolute 0.0  0.0 - 0.7 (K/uL)   Basophils Relative 0  0 - 1 (%)   Basophils Absolute 0.0  0.0 - 0.1 (K/uL)  COMPREHENSIVE METABOLIC PANEL      Component Value Range   Sodium 127 (*) 135 - 145 (mEq/L)   Potassium 4.1  3.5 - 5.1 (mEq/L)   Chloride 93 (*) 96 - 112 (mEq/L)   CO2 19  19 - 32 (mEq/L)   Glucose, Bld 461 (*) 70 - 99 (mg/dL)   BUN 19  6 - 23 (mg/dL)   Creatinine, Ser 1.61  0.50 - 1.35 (mg/dL)   Calcium 8.5  8.4 - 09.6 (mg/dL)   Total Protein 7.5  6.0 - 8.3 (g/dL)   Albumin 3.3 (*) 3.5 - 5.2 (g/dL)   AST 25  0 - 37 (U/L)   ALT 16  0 - 53 (U/L)   Alkaline Phosphatase 72  39 - 117 (U/L)   Total Bilirubin 0.5  0.3 - 1.2 (mg/dL)   GFR calc non Af Amer 72 (*) >90 (mL/min)   GFR calc Af Amer 84 (*) >90 (mL/min)  ETHANOL      Component Value Range   Alcohol, Ethyl (B) <11  0 - 11 (mg/dL)  URINALYSIS, ROUTINE W REFLEX MICROSCOPIC      Component Value Range   Color, Urine YELLOW  YELLOW    APPearance CLEAR  CLEAR    Specific Gravity, Urine 1.030  1.005 - 1.030    pH 6.0  5.0 - 8.0    Glucose, UA >1000 (*) NEGATIVE (mg/dL)   Hgb urine dipstick NEGATIVE  NEGATIVE    Bilirubin Urine NEGATIVE  NEGATIVE    Ketones, ur NEGATIVE  NEGATIVE (mg/dL)   Protein, ur 30 (*) NEGATIVE (mg/dL)   Urobilinogen, UA 1.0  0.0 - 1.0 (mg/dL)   Nitrite NEGATIVE  NEGATIVE    Leukocytes, UA NEGATIVE  NEGATIVE   GLUCOSE, CAPILLARY      Component Value Range   Glucose-Capillary 476 (*) 70 - 99 (mg/dL)   Comment 1 Documented in Chart     Comment 2 Notify RN    POCT I-STAT TROPONIN I      Component Value Range   Troponin i, poc 0.02  0.00 - 0.08 (ng/mL)   Comment 3           GLUCOSE, CAPILLARY      Component Value Range   Glucose-Capillary 308 (*) 70 - 99 (mg/dL)  URINE RAPID DRUG SCREEN (HOSP PERFORMED)      Component Value Range    Opiates NONE DETECTED  NONE DETECTED    Cocaine POSITIVE (*) NONE DETECTED    Benzodiazepines NONE DETECTED  NONE DETECTED    Amphetamines NONE DETECTED  NONE DETECTED    Tetrahydrocannabinol NONE DETECTED  NONE DETECTED    Barbiturates NONE DETECTED  NONE DETECTED   AMMONIA      Component Value Range   Ammonia 23  11 - 60 (umol/L)  BLOOD GAS, ARTERIAL      Component Value Range   O2 Content 2.0     pH, Arterial 7.383  7.350 - 7.450    pCO2 arterial 35.7  35.0 - 45.0 (mmHg)   pO2, Arterial 112.0 (*) 80.0 - 100.0 (mmHg)   Bicarbonate 20.8  20.0 - 24.0 (mEq/L)   TCO2 19.1  0 - 100 (mmol/L)   Acid-base deficit 3.2 (*) 0.0 - 2.0 (mmol/L)   O2 Saturation 97.9     Patient temperature 98.6     Collection site RIGHT RADIAL     Drawn by [id number]  Sample type ARTERIAL DRAW     Allens test (pass/fail) PASS  PASS   URIC ACID      Component Value Range   Uric Acid, Serum 12.0 (*) 4.0 - 7.8 (mg/dL)  URINE MICROSCOPIC-ADD ON      Component Value Range   WBC, UA 0-2  <3 (WBC/hpf)  CARDIAC PANEL(CRET KIN+CKTOT+MB+TROPI)      Component Value Range   Total CK 667 (*) 7 - 232 (U/L)   CK, MB 4.1 (*) 0.3 - 4.0 (ng/mL)   Troponin I <0.30  <0.30 (ng/mL)   Relative Index 0.6  0.0 - 2.5   CARDIAC PANEL(CRET KIN+CKTOT+MB+TROPI)      Component Value Range   Total CK 525 (*) 7 - 232 (U/L)   CK, MB 3.1  0.3 - 4.0 (ng/mL)   Troponin I <0.30  <0.30 (ng/mL)   Relative Index 0.6  0.0 - 2.5   HEMOGLOBIN A1C      Component Value Range   Hemoglobin A1C 16.0 (*) <5.7 (%)   Mean Plasma Glucose 413 (*) <117 (mg/dL)  TSH      Component Value Range   TSH 0.373  0.350 - 4.500 (uIU/mL)  BASIC METABOLIC PANEL      Component Value Range   Sodium 134 (*) 135 - 145 (mEq/L)   Potassium 3.8  3.5 - 5.1 (mEq/L)   Chloride 104  96 - 112 (mEq/L)   CO2 21  19 - 32 (mEq/L)   Glucose, Bld 307 (*) 70 - 99 (mg/dL)   BUN 13  6 - 23 (mg/dL)   Creatinine, Ser 6.43  0.50 - 1.35 (mg/dL)   Calcium 7.8 (*) 8.4 - 10.5  (mg/dL)   GFR calc non Af Amer >90  >90 (mL/min)   GFR calc Af Amer >90  >90 (mL/min)  CBC      Component Value Range   WBC 8.0  4.0 - 10.5 (K/uL)   RBC 3.86 (*) 4.22 - 5.81 (MIL/uL)   Hemoglobin 10.7 (*) 13.0 - 17.0 (g/dL)   HCT 32.9 (*) 51.8 - 52.0 (%)   MCV 82.9  78.0 - 100.0 (fL)   MCH 27.7  26.0 - 34.0 (pg)   MCHC 33.4  30.0 - 36.0 (g/dL)   RDW 84.1  66.0 - 63.0 (%)   Platelets 218  150 - 400 (K/uL)  PROTIME-INR      Component Value Range   Prothrombin Time 14.2  11.6 - 15.2 (seconds)   INR 1.08  0.00 - 1.49   APTT      Component Value Range   aPTT 39 (*) 24 - 37 (seconds)  GLUCOSE, CAPILLARY      Component Value Range   Glucose-Capillary 421 (*) 70 - 99 (mg/dL)  CARDIAC PANEL(CRET KIN+CKTOT+MB+TROPI)      Component Value Range   Total CK 449 (*) 7 - 232 (U/L)   CK, MB 3.0  0.3 - 4.0 (ng/mL)   Troponin I <0.30  <0.30 (ng/mL)   Relative Index 0.7  0.0 - 2.5   GLUCOSE, CAPILLARY      Component Value Range   Glucose-Capillary 283 (*) 70 - 99 (mg/dL)  GLUCOSE, CAPILLARY      Component Value Range   Glucose-Capillary 342 (*) 70 - 99 (mg/dL)  URINALYSIS, ROUTINE W REFLEX MICROSCOPIC      Component Value Range   Color, Urine YELLOW  YELLOW    APPearance CLEAR  CLEAR    Specific Gravity, Urine 1.029  1.005 - 1.030  pH 6.5  5.0 - 8.0    Glucose, UA >1000 (*) NEGATIVE (mg/dL)   Hgb urine dipstick NEGATIVE  NEGATIVE    Bilirubin Urine NEGATIVE  NEGATIVE    Ketones, ur NEGATIVE  NEGATIVE (mg/dL)   Protein, ur NEGATIVE  NEGATIVE (mg/dL)   Urobilinogen, UA 1.0  0.0 - 1.0 (mg/dL)   Nitrite NEGATIVE  NEGATIVE    Leukocytes, UA NEGATIVE  NEGATIVE   GLUCOSE, CAPILLARY      Component Value Range   Glucose-Capillary 301 (*) 70 - 99 (mg/dL)  URINE MICROSCOPIC-ADD ON      Component Value Range   Squamous Epithelial / LPF RARE  RARE    WBC, UA 0-2  <3 (WBC/hpf)   RBC / HPF 0-2  <3 (RBC/hpf)   Dg Chest 2 View  12/08/2011  *RADIOLOGY REPORT*  Clinical Data: Hyperglycemia.   Chest pain.  Short of breath.  CHEST - 2 VIEW  Comparison: 08/24/2011.  Findings:  Cardiopericardial silhouette within normal limits. Mediastinal contours normal. Trachea midline.  No airspace disease or effusion. Monitoring leads are projected over the chest.  IMPRESSION: No acute cardiopulmonary disease.  Original Report Authenticated By: Andreas Newport, M.D.   Ct Head Wo Contrast  12/08/2011  *RADIOLOGY REPORT*  Clinical Data: Elevated blood sugar.  Hearing voices and chronically depressed.  Confusion.  CT HEAD WITHOUT CONTRAST  Technique:  Contiguous axial images were obtained from the base of the skull through the vertex without contrast.  Comparison: 09/28/2003  Findings: Bone windows demonstrate clear paranasal sinuses and mastoid air cells.  Soft tissue windows demonstrate no  mass lesion, hemorrhage, hydrocephalus, acute infarct, intra-axial, or extra-axial fluid collection.  IMPRESSION: Normal head CT.  Original Report Authenticated By: Consuello Bossier, M.D.      No diagnosis found.    MDM          Lottie Mussel, PA 12/09/11 [phone]

## 2011-12-09 LAB — GLUCOSE, CAPILLARY: Glucose-Capillary: 342 mg/dL — ABNORMAL HIGH (ref 70–99)

## 2011-12-09 LAB — CBC
MCH: 27.7 pg (ref 26.0–34.0)
MCHC: 33.4 g/dL (ref 30.0–36.0)
Platelets: 218 10*3/uL (ref 150–400)
RBC: 3.86 MIL/uL — ABNORMAL LOW (ref 4.22–5.81)

## 2011-12-09 LAB — CARDIAC PANEL(CRET KIN+CKTOT+MB+TROPI)
CK, MB: 3 ng/mL (ref 0.3–4.0)
Relative Index: 0.6 (ref 0.0–2.5)
Total CK: 525 U/L — ABNORMAL HIGH (ref 7–232)
Total CK: 449 U/L — ABNORMAL HIGH (ref 7–232)
Troponin I: <0.3 ng/mL (ref <0.30)

## 2011-12-09 LAB — HEMOGLOBIN A1C: Hgb A1c MFr Bld: 16 % — ABNORMAL HIGH (ref <5.7)

## 2011-12-09 LAB — URINALYSIS, ROUTINE W REFLEX MICROSCOPIC
Bilirubin Urine: NEGATIVE
Glucose, UA: >1000 mg/dL — AB
Hgb urine dipstick: NEGATIVE
Ketones, ur: NEGATIVE mg/dL
Nitrite: NEGATIVE
Specific Gravity, Urine: 1.029 (ref 1.005–1.030)
pH: 6.5 (ref 5.0–8.0)

## 2011-12-09 LAB — BASIC METABOLIC PANEL
Calcium: 7.8 mg/dL — ABNORMAL LOW (ref 8.4–10.5)
GFR calc non Af Amer: >90 mL/min (ref >90)
Glucose, Bld: 307 mg/dL — ABNORMAL HIGH (ref 70–99)
Sodium: 134 mEq/L — ABNORMAL LOW (ref 135–145)

## 2011-12-09 LAB — URINE CULTURE
Colony Count: NO GROWTH
Culture  Setup Time: 201302172132

## 2011-12-09 LAB — URINE MICROSCOPIC-ADD ON

## 2011-12-09 MED ORDER — COLCHICINE 0.6 MG PO TABS
0.6000 mg | ORAL_TABLET | Freq: Three times a day (TID) | ORAL | Status: DC
  Administered 2011-12-09 – 2011-12-12 (×10): 0.6 mg via ORAL
  Filled 2011-12-09 (×15): qty 1

## 2011-12-09 MED ORDER — INSULIN GLARGINE 100 UNIT/ML ~~LOC~~ SOLN
40.0000 [IU] | Freq: Every day | SUBCUTANEOUS | Status: DC
  Administered 2011-12-09 – 2011-12-10 (×2): 40 [IU] via SUBCUTANEOUS
  Filled 2011-12-09: qty 3

## 2011-12-09 MED ORDER — LISINOPRIL 10 MG PO TABS
10.0000 mg | ORAL_TABLET | Freq: Every day | ORAL | Status: DC
  Administered 2011-12-09 – 2011-12-11 (×3): 10 mg via ORAL
  Filled 2011-12-09 (×4): qty 1

## 2011-12-09 MED ORDER — INSULIN ASPART 100 UNIT/ML ~~LOC~~ SOLN
8.0000 [IU] | Freq: Once | SUBCUTANEOUS | Status: AC
  Administered 2011-12-09: 8 [IU] via SUBCUTANEOUS

## 2011-12-09 MED ORDER — DEXTROSE 5 % IV SOLN
1.0000 g | INTRAVENOUS | Status: DC
  Administered 2011-12-09 – 2011-12-10 (×2): 1 g via INTRAVENOUS
  Filled 2011-12-09 (×2): qty 10

## 2011-12-09 MED ORDER — TRAMADOL HCL 50 MG PO TABS
50.0000 mg | ORAL_TABLET | Freq: Four times a day (QID) | ORAL | Status: AC | PRN
  Administered 2011-12-09 – 2011-12-10 (×2): 50 mg via ORAL
  Filled 2011-12-09 (×2): qty 1

## 2011-12-09 MED ORDER — ALLOPURINOL 150 MG HALF TABLET
150.0000 mg | ORAL_TABLET | Freq: Two times a day (BID) | ORAL | Status: DC
  Administered 2011-12-09: 13:00:00 via ORAL
  Administered 2011-12-09 – 2011-12-12 (×6): 150 mg via ORAL
  Filled 2011-12-09 (×10): qty 1

## 2011-12-09 NOTE — Progress Notes (Signed)
CARE MANAGEMENT NOTE 12/09/2011  Patient:  JEBEDIAH, MACRAE   Account Number:  [id number]  Date Initiated:  12/09/2011  Documentation initiated by:  Surah Pelley  Subjective/Objective Assessment:   pt requesting medication assistance     Action/Plan:   patient has medicaid [place] access is not eligible for indigent funds   Anticipated DC Date:  12/09/2011   Anticipated DC Plan:  HOME/SELF CARE  In-house referral  NA      DC Planning Services  CM consult  Medication Assistance      [facility] Choice  NA   Choice offered to / List presented to:  NA   DME arranged  NA      DME agency  NA     HH arranged  NA      HH agency  NA   Status of service:  In process, will continue to follow Medicare Important Message given?  NA - LOS <3 / Initial given by admissions (If response is "NO", the following Medicare IM given date fields will be blank) Date Medicare IM given:   Date Additional Medicare IM given:    Discharge Disposition:    Per UR Regulation:  Reviewed for med. necessity/level of care/duration of stay  Comments:  [id number]/Ara Grandmaison,RN,BSN,CCM

## 2011-12-09 NOTE — ED Provider Notes (Signed)
Medical screening examination/treatment/procedure(s) were conducted as a shared visit with non-physician practitioner(s) and myself.  I personally evaluated the patient during the encounter  Hurman Horn, MD 12/09/11 2204

## 2011-12-09 NOTE — Progress Notes (Signed)
Nutrition Brief Note:  RN reports pt with increased hunger.  Pt admitted with hyperglycemia.  HgBA1C: 16.0%.  CBGs remain elevated at >350 mg/dL.  Pt has initiated a CHO Modified diet.  Will add double protein portions, limit excess CHOs to moderate hunger while blood glucose trending down.  Pager: [phone]

## 2011-12-09 NOTE — Progress Notes (Signed)
Inpatient Diabetes Program Recommendations  AACE/ADA: New Consensus Statement on Inpatient Glycemic Control (2009)  Target Ranges:  Prepandial:   less than 140 mg/dL      Peak postprandial:   less than 180 mg/dL (1-2 hours)      Critically ill patients:  140 - 180 mg/dL   Reason for Visit: Hyperglycemia  Note: A1C of 16.0.  Homeless.  No PCP.  Positive for cocaine.  Not taken medications for 2 months.  Not acidotic.  Has Medicaid.  Previously supposed to be taking NPH and Regular.  To begin taking Lantus tonight.  If patient will take it, Lantus may be a better option for him given the lack of peak and no meal coverge.  Psycho-social issues obviously causing barrier to self-care in general-- and specifically to diabetes self-care.  Case Management involved.

## 2011-12-09 NOTE — Progress Notes (Signed)
Patient ID: [name], male   DOB: [birth year], 56 y.o.   MRN: [id number] Subjective: Patient seen. Complain of right flank pain. Also complained of pain and swelling right knee and left elbow  Objective: Weight change:   Intake/Output Summary (Last 24 hours) at 12/09/11 1128 Last data filed at 12/09/11 0900  Gross per 24 hour  Intake   2990 ml  Output   1350 ml  Net   1640 ml   BP 135/73  Pulse 97  Temp(Src) 98 F (36.7 C) (Oral)  Resp 20  Ht 5' 3.78" (1.62 m)  Wt 76.6 kg (168 lb 14 oz)  BMI 29.19 kg/m2  SpO2 97% Physical Exam: General appearance: alert, cooperative and not in distress,pallor Head: Normocephalic, without obvious abnormality, atraumatic Neck: no adenopathy, no carotid bruit, no JVD, supple, symmetrical, trachea midline and thyroid not enlarged, symmetric, no tenderness/mass/nodules Lungs: clear to auscultation bilaterally Heart: regular rate and rhythm, S1, S2 normal, no murmur, click, rub or gallop Abdomen: soft, tenderness right flank with slight guarding, no organomegaly, bowel sounds are positive Extremities: Tenderness and swelling right knee, slightly warm to touch. Tenderness left elbow Skin: Normal turgor  Lab Results: Results for orders placed during the hospital encounter of 12/08/11 (from the past 48 hour(s))  GLUCOSE, CAPILLARY     Status: Abnormal   Collection Time   12/08/11  1:07 PM      Component Value Range Comment   Glucose-Capillary 476 (*) 70 - 99 (mg/dL)    Comment 1 Documented in Chart      Comment 2 Notify RN     CBC     Status: Abnormal   Collection Time   12/08/11  1:15 PM      Component Value Range Comment   WBC 9.6  4.0 - 10.5 (K/uL)    RBC 4.27  4.22 - 5.81 (MIL/uL)    Hemoglobin 11.7 (*) 13.0 - 17.0 (g/dL)    HCT 13.0 (*) 86.5 - 52.0 (%)    MCV 82.2  78.0 - 100.0 (fL)    MCH 27.4  26.0 - 34.0 (pg)    MCHC 33.3  30.0 - 36.0 (g/dL)    RDW 78.4  69.6 - 29.5 (%)    Platelets 246  150 - 400 (K/uL)   DIFFERENTIAL      Status: Normal   Collection Time   12/08/11  1:15 PM      Component Value Range Comment   Neutrophils Relative 68  43 - 77 (%)    Neutro Abs 6.6  1.7 - 7.7 (K/uL)    Lymphocytes Relative 22  12 - 46 (%)    Lymphs Abs 2.1  0.7 - 4.0 (K/uL)    Monocytes Relative 10  3 - 12 (%)    Monocytes Absolute 0.9  0.1 - 1.0 (K/uL)    Eosinophils Relative 0  0 - 5 (%)    Eosinophils Absolute 0.0  0.0 - 0.7 (K/uL)    Basophils Relative 0  0 - 1 (%)    Basophils Absolute 0.0  0.0 - 0.1 (K/uL)   COMPREHENSIVE METABOLIC PANEL     Status: Abnormal   Collection Time   12/08/11  1:15 PM      Component Value Range Comment   Sodium 127 (*) 135 - 145 (mEq/L)    Potassium 4.1  3.5 - 5.1 (mEq/L)    Chloride 93 (*) 96 - 112 (mEq/L)    CO2 19  19 - 32 (mEq/L)  Glucose, Bld 461 (*) 70 - 99 (mg/dL)    BUN 19  6 - 23 (mg/dL)    Creatinine, Ser 6.04  0.50 - 1.35 (mg/dL)    Calcium 8.5  8.4 - 10.5 (mg/dL)    Total Protein 7.5  6.0 - 8.3 (g/dL)    Albumin 3.3 (*) 3.5 - 5.2 (g/dL)    AST 25  0 - 37 (U/L)    ALT 16  0 - 53 (U/L)    Alkaline Phosphatase 72  39 - 117 (U/L)    Total Bilirubin 0.5  0.3 - 1.2 (mg/dL)    GFR calc non Af Amer 72 (*) >90 (mL/min)    GFR calc Af Amer 84 (*) >90 (mL/min)   ETHANOL     Status: Normal   Collection Time   12/08/11  1:15 PM      Component Value Range Comment   Alcohol, Ethyl (B) <11  0 - 11 (mg/dL)   POCT I-STAT TROPONIN I     Status: Normal   Collection Time   12/08/11  2:26 PM      Component Value Range Comment   Troponin i, poc 0.02  0.00 - 0.08 (ng/mL)    Comment 3            URINALYSIS, ROUTINE W REFLEX MICROSCOPIC     Status: Abnormal   Collection Time   12/08/11  4:17 PM      Component Value Range Comment   Color, Urine YELLOW  YELLOW     APPearance CLEAR  CLEAR     Specific Gravity, Urine 1.030  1.005 - 1.030     pH 6.0  5.0 - 8.0     Glucose, UA >1000 (*) NEGATIVE (mg/dL)    Hgb urine dipstick NEGATIVE  NEGATIVE     Bilirubin Urine NEGATIVE  NEGATIVE      Ketones, ur NEGATIVE  NEGATIVE (mg/dL)    Protein, ur 30 (*) NEGATIVE (mg/dL)    Urobilinogen, UA 1.0  0.0 - 1.0 (mg/dL)    Nitrite NEGATIVE  NEGATIVE     Leukocytes, UA NEGATIVE  NEGATIVE    URINE MICROSCOPIC-ADD ON     Status: Normal   Collection Time   12/08/11  4:17 PM      Component Value Range Comment   WBC, UA 0-2  <3 (WBC/hpf)   GLUCOSE, CAPILLARY     Status: Abnormal   Collection Time   12/08/11  4:40 PM      Component Value Range Comment   Glucose-Capillary 308 (*) 70 - 99 (mg/dL)   URINE RAPID DRUG SCREEN (HOSP PERFORMED)     Status: Abnormal   Collection Time   12/08/11  5:37 PM      Component Value Range Comment   Opiates NONE DETECTED  NONE DETECTED     Cocaine POSITIVE (*) NONE DETECTED     Benzodiazepines NONE DETECTED  NONE DETECTED     Amphetamines NONE DETECTED  NONE DETECTED     Tetrahydrocannabinol NONE DETECTED  NONE DETECTED     Barbiturates NONE DETECTED  NONE DETECTED    BLOOD GAS, ARTERIAL     Status: Abnormal   Collection Time   12/08/11  5:41 PM      Component Value Range Comment   O2 Content 2.0      pH, Arterial 7.383  7.350 - 7.450     pCO2 arterial 35.7  35.0 - 45.0 (mmHg)    pO2, Arterial 112.0 (*) 80.0 -  100.0 (mmHg)    Bicarbonate 20.8  20.0 - 24.0 (mEq/L)    TCO2 19.1  0 - 100 (mmol/L)    Acid-base deficit 3.2 (*) 0.0 - 2.0 (mmol/L)    O2 Saturation 97.9      Patient temperature 98.6      Collection site RIGHT RADIAL      Drawn by [phone]      Sample type ARTERIAL DRAW      Allens test (pass/fail) PASS  PASS    AMMONIA     Status: Normal   Collection Time   12/08/11  6:07 PM      Component Value Range Comment   Ammonia 23  11 - 60 (umol/L)   URIC ACID     Status: Abnormal   Collection Time   12/08/11  6:07 PM      Component Value Range Comment   Uric Acid, Serum 12.0 (*) 4.0 - 7.8 (mg/dL)   CARDIAC PANEL(CRET KIN+CKTOT+MB+TROPI)     Status: Abnormal   Collection Time   12/08/11  9:44 PM      Component Value Range Comment   Total CK  667 (*) 7 - 232 (U/L)    CK, MB 4.1 (*) 0.3 - 4.0 (ng/mL)    Troponin I <0.30  <0.30 (ng/mL)    Relative Index 0.6  0.0 - 2.5    HEMOGLOBIN A1C     Status: Abnormal   Collection Time   12/08/11  9:44 PM      Component Value Range Comment   Hemoglobin A1C 16.0 (*) <5.7 (%)    Mean Plasma Glucose 413 (*) <117 (mg/dL)   TSH     Status: Normal   Collection Time   12/08/11  9:44 PM      Component Value Range Comment   TSH 0.373  0.350 - 4.500 (uIU/mL)   PROTIME-INR     Status: Normal   Collection Time   12/08/11  9:44 PM      Component Value Range Comment   Prothrombin Time 14.2  11.6 - 15.2 (seconds)    INR 1.08  0.00 - 1.49    APTT     Status: Abnormal   Collection Time   12/08/11  9:44 PM      Component Value Range Comment   aPTT 39 (*) 24 - 37 (seconds)   GLUCOSE, CAPILLARY     Status: Abnormal   Collection Time   12/08/11 10:02 PM      Component Value Range Comment   Glucose-Capillary 421 (*) 70 - 99 (mg/dL)   CARDIAC PANEL(CRET KIN+CKTOT+MB+TROPI)     Status: Abnormal   Collection Time   12/09/11  5:02 AM      Component Value Range Comment   Total CK 525 (*) 7 - 232 (U/L)    CK, MB 3.1  0.3 - 4.0 (ng/mL)    Troponin I <0.30  <0.30 (ng/mL)    Relative Index 0.6  0.0 - 2.5    BASIC METABOLIC PANEL     Status: Abnormal   Collection Time   12/09/11  5:02 AM      Component Value Range Comment   Sodium 134 (*) 135 - 145 (mEq/L)    Potassium 3.8  3.5 - 5.1 (mEq/L)    Chloride 104  96 - 112 (mEq/L)    CO2 21  19 - 32 (mEq/L)    Glucose, Bld 307 (*) 70 - 99 (mg/dL)    BUN 13  6 - 23 (  mg/dL)    Creatinine, Ser 1.61  0.50 - 1.35 (mg/dL)    Calcium 7.8 (*) 8.4 - 10.5 (mg/dL)    GFR calc non Af Amer >90  >90 (mL/min)    GFR calc Af Amer >90  >90 (mL/min)   CBC     Status: Abnormal   Collection Time   12/09/11  5:02 AM      Component Value Range Comment   WBC 8.0  4.0 - 10.5 (K/uL)    RBC 3.86 (*) 4.22 - 5.81 (MIL/uL)    Hemoglobin 10.7 (*) 13.0 - 17.0 (g/dL)    HCT 09.6 (*)  04.5 - 52.0 (%)    MCV 82.9  78.0 - 100.0 (fL)    MCH 27.7  26.0 - 34.0 (pg)    MCHC 33.4  30.0 - 36.0 (g/dL)    RDW 40.9  81.1 - 91.4 (%)    Platelets 218  150 - 400 (K/uL)   GLUCOSE, CAPILLARY     Status: Abnormal   Collection Time   12/09/11  5:24 AM      Component Value Range Comment   Glucose-Capillary 283 (*) 70 - 99 (mg/dL)   GLUCOSE, CAPILLARY     Status: Abnormal   Collection Time   12/09/11  7:57 AM      Component Value Range Comment   Glucose-Capillary 342 (*) 70 - 99 (mg/dL)     Micro Results: No results found for this or any previous visit (from the past 240 hour(s)).  Studies/Results: Dg Chest 2 View  12/08/2011  *RADIOLOGY REPORT*  Clinical Data: Hyperglycemia.  Chest pain.  Short of breath.  CHEST - 2 VIEW  Comparison: 08/24/2011.  Findings:  Cardiopericardial silhouette within normal limits. Mediastinal contours normal. Trachea midline.  No airspace disease or effusion. Monitoring leads are projected over the chest.  IMPRESSION: No acute cardiopulmonary disease.  Original Report Authenticated By: Andreas Newport, M.D.   Ct Head Wo Contrast  12/08/2011  *RADIOLOGY REPORT*  Clinical Data: Elevated blood sugar.  Hearing voices and chronically depressed.  Confusion.  CT HEAD WITHOUT CONTRAST  Technique:  Contiguous axial images were obtained from the base of the skull through the vertex without contrast.  Comparison: 09/28/2003  Findings: Bone windows demonstrate clear paranasal sinuses and mastoid air cells.  Soft tissue windows demonstrate no  mass lesion, hemorrhage, hydrocephalus, acute infarct, intra-axial, or extra-axial fluid collection.  IMPRESSION: Normal head CT.  Original Report Authenticated By: Consuello Bossier, M.D.   Medications: Scheduled Meds:   . sodium chloride   Intravenous Once  . sodium chloride   Intravenous Once  . allopurinol  150 mg Oral BID  . aspirin  324 mg Oral Once  . aspirin EC  81 mg Oral Daily  . cefTRIAXone (ROCEPHIN)  IV  1 g Intravenous  Q24H  . colchicine  0.6 mg Oral TID  . enoxaparin  40 mg Subcutaneous Q24H  . insulin aspart  0-5 Units Subcutaneous QHS  . insulin aspart  0-9 Units Subcutaneous TID WC  . insulin aspart  5 Units Subcutaneous Once  . insulin aspart protamine-insulin aspart  10 Units Subcutaneous Once  . insulin glargine  40 Units Subcutaneous QHS  . sodium chloride  1,000 mL Intravenous Once  . sodium chloride  1,000 mL Intravenous Once  . sodium chloride  3 mL Intravenous Q12H  . DISCONTD: insulin aspart  0-9 Units Subcutaneous TID WC   Continuous Infusions:   . 0.9 % NaCl with KCl 20 mEq /  L 75 mL/hr at 12/08/11 2242   PRN Meds:.alum & mag hydroxide-simeth, indomethacin, ondansetron (ZOFRAN) IV, ondansetron  Assessment/Plan: #1 nonketotic hyperosmolar hyperglycemia - hemoglobin A1c 16.1. Plan is to add Lantus insulin to regimen. #2 acute gouty arthritis right knee-uric acid level elevated. Plan is to start colchicine and allopurinol. Pain controlled with indomethacin.  #3 Major depression, chronic #4 Hypertension add lisinopril to regimen for adequate blood pressure control   #5 Noncompliance-recently released from jail after serving 35 years for murder. Currently unable to afford  medication and the plan is to consult social services for help.  #6 CAD- not decompensating.    LOS: 1 day   Lavelle Akel 12/09/2011, 11:28 AM

## 2011-12-10 LAB — COMPREHENSIVE METABOLIC PANEL
ALT: 14 U/L (ref 0–53)
Albumin: 2.7 g/dL — ABNORMAL LOW (ref 3.5–5.2)
Alkaline Phosphatase: 69 U/L (ref 39–117)
Chloride: 103 mEq/L (ref 96–112)
Potassium: 3.7 mEq/L (ref 3.5–5.1)
Sodium: 133 mEq/L — ABNORMAL LOW (ref 135–145)
Total Protein: 6.4 g/dL (ref 6.0–8.3)

## 2011-12-10 LAB — DIFFERENTIAL
Basophils Relative: 0 % (ref 0–1)
Eosinophils Absolute: 0.1 10*3/uL (ref 0.0–0.7)
Lymphs Abs: 2.5 10*3/uL (ref 0.7–4.0)
Neutro Abs: 3.7 10*3/uL (ref 1.7–7.7)
Neutrophils Relative %: 55 % (ref 43–77)

## 2011-12-10 LAB — GLUCOSE, CAPILLARY
Glucose-Capillary: 244 mg/dL — ABNORMAL HIGH (ref 70–99)
Glucose-Capillary: 362 mg/dL — ABNORMAL HIGH (ref 70–99)

## 2011-12-10 LAB — CBC
MCH: 27.6 pg (ref 26.0–34.0)
MCHC: 33.7 g/dL (ref 30.0–36.0)
Platelets: 232 10*3/uL (ref 150–400)
RBC: 3.62 MIL/uL — ABNORMAL LOW (ref 4.22–5.81)

## 2011-12-10 MED ORDER — TRAMADOL HCL 50 MG PO TABS
50.0000 mg | ORAL_TABLET | Freq: Four times a day (QID) | ORAL | Status: DC | PRN
  Administered 2011-12-10 – 2011-12-11 (×3): 50 mg via ORAL
  Filled 2011-12-10 (×3): qty 1

## 2011-12-10 NOTE — Progress Notes (Signed)
Patient asked for Bible. Brought him one. Then he asked for prayer. Asked what he would ask God. Patient talked about his "poor choices" in life, said, "I would like change." Listening, presence, prayer, support.  12/10/11 1200  Clinical Encounter Type  Visited With Patient  Visit Type Spiritual support;Social support  Referral From Nurse;Patient  Recommendations Follow up as needed  Spiritual Encounters  Spiritual Needs Emotional;Prayer  Stress Factors  Patient Stress Factors Other (Comment) ("Poor choices")

## 2011-12-10 NOTE — Progress Notes (Signed)
Talked to patient at length about DCP; patient was recently released from prison Sept 2012 and stated that he has been making poor decisions. Lots of encouragement given. Patient stated that he plans to move to [place] to be close to his family. He plans to stay with his mother. Patient recently received Medicaid / prescription coverage $3.00 and has been seeing Dr Billee Cashing / PCP; Patient stated that he has been around the wrong people using drugs but plans to stop - he has been in the 12 step program for drug counseling and was recently at [facility] for outpatient psych counseling. Lots of encouragement given - lives with spouse, brother in laws are very supportive. He will be off of parole February 04, 2012 and plans to get a job. Patient stated that he is chronically depressed due to what he has been through and plans to get outpatient f/u treatment. Social Worker Josie called for outpatient psych resources in the [place] area. Talked about spirituality and patient requested a Bible - his nurse will find him a Bible for him to read and will notify the [facility] to stop by. Abelino Derrick RN, BSN, MHA.

## 2011-12-10 NOTE — Progress Notes (Signed)
Patient ID: [name], male   DOB: 02-04-1956, 56 y.o.   MRN: [id number] Patient ID: [name], male   DOB: 1956-09-01, 56 y.o.   MRN: [id number] Subjective: Patient seen.Feels better. Swelling right knee and left elbow is less  and patient is able to ambulate and move his left elbow. Urinalysis unremarkable and therefore I Rocephin will be discontinued.  Objective: Weight change: 1.8 kg (3 lb 15.5 oz)  Intake/Output Summary (Last 24 hours) at 12/10/11 1445 Last data filed at 12/10/11 1020  Gross per 24 hour  Intake    460 ml  Output    500 ml  Net    -40 ml   BP 151/82  Pulse 74  Temp(Src) 97.8 F (36.6 C) (Oral)  Resp 16  Ht 5' 3.78" (1.62 m)  Wt 77.5 kg (170 lb 13.7 oz)  BMI 29.53 kg/m2  SpO2 98% Physical Exam: General appearance: alert, cooperative and not in distress,pallor Head: Normocephalic, without obvious abnormality, atraumatic Neck: no adenopathy, no carotid bruit, no JVD, supple, symmetrical, trachea midline and thyroid not enlarged, symmetric, no tenderness/mass/nodules Lungs: clear to auscultation bilaterally Heart: regular rate and rhythm, S1, S2 normal, no murmur, click, rub or gallop Abdomen: soft,, non tender, no organomegaly, bowel sounds are positive Extremities: Less tenderness and swelling right knee, not warm to touch. Less tenderness left elbow Skin: Normal turgor  Lab Results: Results for orders placed during the hospital encounter of 12/08/11 (from the past 48 hour(s))  URINALYSIS, ROUTINE W REFLEX MICROSCOPIC     Status: Abnormal   Collection Time   12/08/11  4:17 PM      Component Value Range Comment   Color, Urine YELLOW  YELLOW     APPearance CLEAR  CLEAR     Specific Gravity, Urine 1.030  1.005 - 1.030     pH 6.0  5.0 - 8.0     Glucose, UA >1000 (*) NEGATIVE (mg/dL)    Hgb urine dipstick NEGATIVE  NEGATIVE     Bilirubin Urine NEGATIVE  NEGATIVE     Ketones, ur NEGATIVE  NEGATIVE (mg/dL)    Protein, ur 30 (*) NEGATIVE (mg/dL)    Urobilinogen, UA 1.0  0.0 - 1.0 (mg/dL)    Nitrite NEGATIVE  NEGATIVE     Leukocytes, UA NEGATIVE  NEGATIVE    URINE MICROSCOPIC-ADD ON     Status: Normal   Collection Time   12/08/11  4:17 PM      Component Value Range Comment   WBC, UA 0-2  <3 (WBC/hpf)   GLUCOSE, CAPILLARY     Status: Abnormal   Collection Time   12/08/11  4:40 PM      Component Value Range Comment   Glucose-Capillary 308 (*) 70 - 99 (mg/dL)   URINE CULTURE     Status: Normal   Collection Time   12/08/11  5:28 PM      Component Value Range Comment   Specimen Description URINE, CLEAN CATCH      Special Requests NONE      Culture  Setup Time [id number]      Colony Count NO GROWTH      Culture NO GROWTH      Report Status 12/09/2011 FINAL     URINE RAPID DRUG SCREEN (HOSP PERFORMED)     Status: Abnormal   Collection Time   12/08/11  5:37 PM      Component Value Range Comment   Opiates NONE DETECTED  NONE DETECTED  Cocaine POSITIVE (*) NONE DETECTED     Benzodiazepines NONE DETECTED  NONE DETECTED     Amphetamines NONE DETECTED  NONE DETECTED     Tetrahydrocannabinol NONE DETECTED  NONE DETECTED     Barbiturates NONE DETECTED  NONE DETECTED    BLOOD GAS, ARTERIAL     Status: Abnormal   Collection Time   12/08/11  5:41 PM      Component Value Range Comment   O2 Content 2.0      pH, Arterial 7.383  7.350 - 7.450     pCO2 arterial 35.7  35.0 - 45.0 (mmHg)    pO2, Arterial 112.0 (*) 80.0 - 100.0 (mmHg)    Bicarbonate 20.8  20.0 - 24.0 (mEq/L)    TCO2 19.1  0 - 100 (mmol/L)    Acid-base deficit 3.2 (*) 0.0 - 2.0 (mmol/L)    O2 Saturation 97.9      Patient temperature 98.6      Collection site RIGHT RADIAL      Drawn by [id number]      Sample type ARTERIAL DRAW      Allens test (pass/fail) PASS  PASS    AMMONIA     Status: Normal   Collection Time   12/08/11  6:07 PM      Component Value Range Comment   Ammonia 23  11 - 60 (umol/L)   URIC ACID     Status: Abnormal   Collection Time   12/08/11  6:07 PM       Component Value Range Comment   Uric Acid, Serum 12.0 (*) 4.0 - 7.8 (mg/dL)   CARDIAC PANEL(CRET KIN+CKTOT+MB+TROPI)     Status: Abnormal   Collection Time   12/08/11  9:44 PM      Component Value Range Comment   Total CK 667 (*) 7 - 232 (U/L)    CK, MB 4.1 (*) 0.3 - 4.0 (ng/mL)    Troponin I <0.30  <0.30 (ng/mL)    Relative Index 0.6  0.0 - 2.5    HEMOGLOBIN A1C     Status: Abnormal   Collection Time   12/08/11  9:44 PM      Component Value Range Comment   Hemoglobin A1C 16.0 (*) <5.7 (%)    Mean Plasma Glucose 413 (*) <117 (mg/dL)   TSH     Status: Normal   Collection Time   12/08/11  9:44 PM      Component Value Range Comment   TSH 0.373  0.350 - 4.500 (uIU/mL)   PROTIME-INR     Status: Normal   Collection Time   12/08/11  9:44 PM      Component Value Range Comment   Prothrombin Time 14.2  11.6 - 15.2 (seconds)    INR 1.08  0.00 - 1.49    APTT     Status: Abnormal   Collection Time   12/08/11  9:44 PM      Component Value Range Comment   aPTT 39 (*) 24 - 37 (seconds)   GLUCOSE, CAPILLARY     Status: Abnormal   Collection Time   12/08/11 10:02 PM      Component Value Range Comment   Glucose-Capillary 421 (*) 70 - 99 (mg/dL)   CARDIAC PANEL(CRET KIN+CKTOT+MB+TROPI)     Status: Abnormal   Collection Time   12/09/11  5:02 AM      Component Value Range Comment   Total CK 525 (*) 7 - 232 (U/L)    CK, MB 3.1  0.3 - 4.0 (ng/mL)    Troponin I <0.30  <0.30 (ng/mL)    Relative Index 0.6  0.0 - 2.5    BASIC METABOLIC PANEL     Status: Abnormal   Collection Time   12/09/11  5:02 AM      Component Value Range Comment   Sodium 134 (*) 135 - 145 (mEq/L)    Potassium 3.8  3.5 - 5.1 (mEq/L)    Chloride 104  96 - 112 (mEq/L)    CO2 21  19 - 32 (mEq/L)    Glucose, Bld 307 (*) 70 - 99 (mg/dL)    BUN 13  6 - 23 (mg/dL)    Creatinine, Ser 1.61  0.50 - 1.35 (mg/dL)    Calcium 7.8 (*) 8.4 - 10.5 (mg/dL)    GFR calc non Af Amer >90  >90 (mL/min)    GFR calc Af Amer >90  >90 (mL/min)     CBC     Status: Abnormal   Collection Time   12/09/11  5:02 AM      Component Value Range Comment   WBC 8.0  4.0 - 10.5 (K/uL)    RBC 3.86 (*) 4.22 - 5.81 (MIL/uL)    Hemoglobin 10.7 (*) 13.0 - 17.0 (g/dL)    HCT 09.6 (*) 04.5 - 52.0 (%)    MCV 82.9  78.0 - 100.0 (fL)    MCH 27.7  26.0 - 34.0 (pg)    MCHC 33.4  30.0 - 36.0 (g/dL)    RDW 40.9  81.1 - 91.4 (%)    Platelets 218  150 - 400 (K/uL)   GLUCOSE, CAPILLARY     Status: Abnormal   Collection Time   12/09/11  5:24 AM      Component Value Range Comment   Glucose-Capillary 283 (*) 70 - 99 (mg/dL)   GLUCOSE, CAPILLARY     Status: Abnormal   Collection Time   12/09/11  7:57 AM      Component Value Range Comment   Glucose-Capillary 342 (*) 70 - 99 (mg/dL)   GLUCOSE, CAPILLARY     Status: Abnormal   Collection Time   12/09/11 11:45 AM      Component Value Range Comment   Glucose-Capillary 301 (*) 70 - 99 (mg/dL)   CARDIAC PANEL(CRET KIN+CKTOT+MB+TROPI)     Status: Abnormal   Collection Time   12/09/11 12:33 PM      Component Value Range Comment   Total CK 449 (*) 7 - 232 (U/L)    CK, MB 3.0  0.3 - 4.0 (ng/mL)    Troponin I <0.30  <0.30 (ng/mL)    Relative Index 0.7  0.0 - 2.5    URINALYSIS, ROUTINE W REFLEX MICROSCOPIC     Status: Abnormal   Collection Time   12/09/11  2:46 PM      Component Value Range Comment   Color, Urine YELLOW  YELLOW     APPearance CLEAR  CLEAR     Specific Gravity, Urine 1.029  1.005 - 1.030     pH 6.5  5.0 - 8.0     Glucose, UA >1000 (*) NEGATIVE (mg/dL)    Hgb urine dipstick NEGATIVE  NEGATIVE     Bilirubin Urine NEGATIVE  NEGATIVE     Ketones, ur NEGATIVE  NEGATIVE (mg/dL)    Protein, ur NEGATIVE  NEGATIVE (mg/dL)    Urobilinogen, UA 1.0  0.0 - 1.0 (mg/dL)    Nitrite NEGATIVE  NEGATIVE     Leukocytes, UA  NEGATIVE  NEGATIVE    URINE MICROSCOPIC-ADD ON     Status: Normal   Collection Time   12/09/11  2:46 PM      Component Value Range Comment   Squamous Epithelial / LPF RARE  RARE     WBC,  UA 0-2  <3 (WBC/hpf)    RBC / HPF 0-2  <3 (RBC/hpf)   GLUCOSE, CAPILLARY     Status: Abnormal   Collection Time   12/09/11  4:50 PM      Component Value Range Comment   Glucose-Capillary 382 (*) 70 - 99 (mg/dL)   GLUCOSE, CAPILLARY     Status: Abnormal   Collection Time   12/09/11  9:40 PM      Component Value Range Comment   Glucose-Capillary 491 (*) 70 - 99 (mg/dL)   CBC     Status: Abnormal   Collection Time   12/10/11  4:35 AM      Component Value Range Comment   WBC 6.8  4.0 - 10.5 (K/uL)    RBC 3.62 (*) 4.22 - 5.81 (MIL/uL)    Hemoglobin 10.0 (*) 13.0 - 17.0 (g/dL)    HCT 16.1 (*) 09.6 - 52.0 (%)    MCV 82.0  78.0 - 100.0 (fL)    MCH 27.6  26.0 - 34.0 (pg)    MCHC 33.7  30.0 - 36.0 (g/dL)    RDW 04.5  40.9 - 81.1 (%)    Platelets 232  150 - 400 (K/uL)   DIFFERENTIAL     Status: Normal   Collection Time   12/10/11  4:35 AM      Component Value Range Comment   Neutrophils Relative 55  43 - 77 (%)    Neutro Abs 3.7  1.7 - 7.7 (K/uL)    Lymphocytes Relative 36  12 - 46 (%)    Lymphs Abs 2.5  0.7 - 4.0 (K/uL)    Monocytes Relative 7  3 - 12 (%)    Monocytes Absolute 0.5  0.1 - 1.0 (K/uL)    Eosinophils Relative 2  0 - 5 (%)    Eosinophils Absolute 0.1  0.0 - 0.7 (K/uL)    Basophils Relative 0  0 - 1 (%)    Basophils Absolute 0.0  0.0 - 0.1 (K/uL)   COMPREHENSIVE METABOLIC PANEL     Status: Abnormal   Collection Time   12/10/11  4:35 AM      Component Value Range Comment   Sodium 133 (*) 135 - 145 (mEq/L)    Potassium 3.7  3.5 - 5.1 (mEq/L)    Chloride 103  96 - 112 (mEq/L)    CO2 22  19 - 32 (mEq/L)    Glucose, Bld 347 (*) 70 - 99 (mg/dL)    BUN 18  6 - 23 (mg/dL)    Creatinine, Ser 9.14  0.50 - 1.35 (mg/dL)    Calcium 7.8 (*) 8.4 - 10.5 (mg/dL)    Total Protein 6.4  6.0 - 8.3 (g/dL)    Albumin 2.7 (*) 3.5 - 5.2 (g/dL)    AST 14  0 - 37 (U/L)    ALT 14  0 - 53 (U/L)    Alkaline Phosphatase 69  39 - 117 (U/L)    Total Bilirubin 0.2 (*) 0.3 - 1.2 (mg/dL)    GFR calc  non Af Amer 75 (*) >90 (mL/min)    GFR calc Af Amer 87 (*) >90 (mL/min)   GLUCOSE, CAPILLARY  Status: Abnormal   Collection Time   12/10/11  7:18 AM      Component Value Range Comment   Glucose-Capillary 256 (*) 70 - 99 (mg/dL)   GLUCOSE, CAPILLARY     Status: Abnormal   Collection Time   12/10/11 11:31 AM      Component Value Range Comment   Glucose-Capillary 244 (*) 70 - 99 (mg/dL)     Micro Results: Recent Results (from the past 240 hour(s))  URINE CULTURE     Status: Normal   Collection Time   12/08/11  5:28 PM      Component Value Range Status Comment   Specimen Description URINE, CLEAN CATCH   Final    Special Requests NONE   Final    Culture  Setup Time [id number]   Final    Colony Count NO GROWTH   Final    Culture NO GROWTH   Final    Report Status 12/09/2011 FINAL   Final     Studies/Results: Dg Chest 2 View  12/08/2011  *RADIOLOGY REPORT*  Clinical Data: Hyperglycemia.  Chest pain.  Short of breath.  CHEST - 2 VIEW  Comparison: 08/24/2011.  Findings:  Cardiopericardial silhouette within normal limits. Mediastinal contours normal. Trachea midline.  No airspace disease or effusion. Monitoring leads are projected over the chest.  IMPRESSION: No acute cardiopulmonary disease.  Original Report Authenticated By: Andreas Newport, M.D.   Ct Head Wo Contrast  12/08/2011  *RADIOLOGY REPORT*  Clinical Data: Elevated blood sugar.  Hearing voices and chronically depressed.  Confusion.  CT HEAD WITHOUT CONTRAST  Technique:  Contiguous axial images were obtained from the base of the skull through the vertex without contrast.  Comparison: 09/28/2003  Findings: Bone windows demonstrate clear paranasal sinuses and mastoid air cells.  Soft tissue windows demonstrate no  mass lesion, hemorrhage, hydrocephalus, acute infarct, intra-axial, or extra-axial fluid collection.  IMPRESSION: Normal head CT.  Original Report Authenticated By: Consuello Bossier, M.D.   Medications: Scheduled Meds:     . allopurinol  150 mg Oral BID  . aspirin EC  81 mg Oral Daily  . cefTRIAXone (ROCEPHIN)  IV  1 g Intravenous Q24H  . colchicine  0.6 mg Oral TID  . enoxaparin  40 mg Subcutaneous Q24H  . insulin aspart  0-5 Units Subcutaneous QHS  . insulin aspart  0-9 Units Subcutaneous TID WC  . insulin aspart  8 Units Subcutaneous Once  . insulin glargine  40 Units Subcutaneous QHS  . lisinopril  10 mg Oral Daily  . sodium chloride  3 mL Intravenous Q12H   Continuous Infusions:  PRN Meds:.alum & mag hydroxide-simeth, indomethacin, ondansetron (ZOFRAN) IV, ondansetron, traMADol, traMADol  Assessment/Plan: #1 nonketotic hyperosmolar hyperglycemia - hemoglobin A1c 16.1. Continue Lantus insulin  #2 acute gouty arthritis right knee-right knee left elbow are less tender and patient is able to move these joints. Plan is to continue colchicine and allopurinol.  #3 Major depression, chronic #4 Hypertension-we'll continue lisinopril   #5 Noncompliance-recently released from jail after serving 35 years for murder. patient had a long discussion with the case manager and he'll likely follow up with the psychologist on discharged   #6 CAD- not decompensating.  If stable by a.m,pls d/c home   LOS: 2 days   Davan Hark 12/10/2011, 2:45 PM

## 2011-12-11 LAB — COMPREHENSIVE METABOLIC PANEL
AST: 14 U/L (ref 0–37)
Albumin: 2.7 g/dL — ABNORMAL LOW (ref 3.5–5.2)
Alkaline Phosphatase: 67 U/L (ref 39–117)
BUN: 20 mg/dL (ref 6–23)
Chloride: 105 mEq/L (ref 96–112)
Potassium: 3.8 mEq/L (ref 3.5–5.1)
Sodium: 137 mEq/L (ref 135–145)
Total Bilirubin: 0.1 mg/dL — ABNORMAL LOW (ref 0.3–1.2)
Total Protein: 6.4 g/dL (ref 6.0–8.3)

## 2011-12-11 LAB — DIFFERENTIAL
Basophils Absolute: 0 10*3/uL (ref 0.0–0.1)
Basophils Relative: 0 % (ref 0–1)
Eosinophils Absolute: 0.2 10*3/uL (ref 0.0–0.7)
Neutro Abs: 2.8 10*3/uL (ref 1.7–7.7)
Neutrophils Relative %: 46 % (ref 43–77)

## 2011-12-11 LAB — GLUCOSE, CAPILLARY
Glucose-Capillary: 228 mg/dL — ABNORMAL HIGH (ref 70–99)
Glucose-Capillary: 325 mg/dL — ABNORMAL HIGH (ref 70–99)
Glucose-Capillary: 377 mg/dL — ABNORMAL HIGH (ref 70–99)

## 2011-12-11 LAB — CBC
Hemoglobin: 10.5 g/dL — ABNORMAL LOW (ref 13.0–17.0)
MCH: 27.9 pg (ref 26.0–34.0)
MCHC: 33.4 g/dL (ref 30.0–36.0)
Platelets: 258 10*3/uL (ref 150–400)
RDW: 14.9 % (ref 11.5–15.5)

## 2011-12-11 LAB — URIC ACID: Uric Acid, Serum: 5.6 mg/dL (ref 4.0–7.8)

## 2011-12-11 MED ORDER — INSULIN ASPART 100 UNIT/ML ~~LOC~~ SOLN
5.0000 [IU] | Freq: Three times a day (TID) | SUBCUTANEOUS | Status: DC
  Administered 2011-12-12 (×2): 5 [IU] via SUBCUTANEOUS

## 2011-12-11 MED ORDER — INSULIN ASPART 100 UNIT/ML ~~LOC~~ SOLN
0.0000 [IU] | Freq: Three times a day (TID) | SUBCUTANEOUS | Status: DC
  Administered 2011-12-11: 15 [IU] via SUBCUTANEOUS
  Administered 2011-12-12: 5 [IU] via SUBCUTANEOUS
  Administered 2011-12-12: 15 [IU] via SUBCUTANEOUS
  Filled 2011-12-11: qty 3

## 2011-12-11 MED ORDER — LISINOPRIL 20 MG PO TABS
20.0000 mg | ORAL_TABLET | Freq: Every day | ORAL | Status: DC
  Administered 2011-12-12: 20 mg via ORAL
  Filled 2011-12-11 (×2): qty 1

## 2011-12-11 MED ORDER — INSULIN GLARGINE 100 UNIT/ML ~~LOC~~ SOLN
45.0000 [IU] | Freq: Every day | SUBCUTANEOUS | Status: DC
  Administered 2011-12-11: 45 [IU] via SUBCUTANEOUS
  Filled 2011-12-11: qty 3

## 2011-12-11 MED ORDER — LABETALOL HCL 100 MG PO TABS
100.0000 mg | ORAL_TABLET | Freq: Two times a day (BID) | ORAL | Status: DC
  Administered 2011-12-11 – 2011-12-12 (×2): 100 mg via ORAL
  Filled 2011-12-11 (×3): qty 1

## 2011-12-11 MED ORDER — INSULIN ASPART 100 UNIT/ML ~~LOC~~ SOLN
3.0000 [IU] | Freq: Three times a day (TID) | SUBCUTANEOUS | Status: DC
  Administered 2011-12-11: 3 [IU] via SUBCUTANEOUS

## 2011-12-11 MED ORDER — HYDRALAZINE HCL 20 MG/ML IJ SOLN
5.0000 mg | Freq: Once | INTRAMUSCULAR | Status: AC
  Administered 2011-12-11: 5 mg via INTRAVENOUS
  Filled 2011-12-11: qty 1

## 2011-12-11 MED ORDER — INSULIN ASPART 100 UNIT/ML ~~LOC~~ SOLN
0.0000 [IU] | Freq: Every day | SUBCUTANEOUS | Status: DC
  Administered 2011-12-11: 5 [IU] via SUBCUTANEOUS

## 2011-12-11 NOTE — Progress Notes (Signed)
Subjective:  no new complaints.   Objective: Weight change: -0.3 kg (-10.6 oz)  Intake/Output Summary (Last 24 hours) at 12/11/11 1519 Last data filed at 12/11/11 0930  Gross per 24 hour  Intake   1000 ml  Output    475 ml  Net    525 ml    Filed Vitals:   12/11/11 1354  BP: 173/88  Pulse: 99  Temp: 97.9 F (36.6 C)  Resp: 18  General appearance: alert, cooperative and not in distress,pallor  Head: Normocephalic, without obvious abnormality, atraumatic  Neck: no adenopathy, no carotid bruit, no JVD, supple, symmetrical, trachea midline and thyroid not enlarged, symmetric, no tenderness/mass/nodules  Lungs: clear to auscultation bilaterally  Heart: regular rate and rhythm, S1, S2 normal, no murmur, click, rub or gallop  Abdomen: soft,, non tender, no organomegaly, bowel sounds are positive  Extremities: Less tenderness and swelling right knee, not warm to touch. Less tenderness left elbow  Skin: Normal turgor    Lab Results: Results for orders placed during the hospital encounter of 12/08/11 (from the past 24 hour(s))  GLUCOSE, CAPILLARY     Status: Abnormal   Collection Time   12/10/11  5:11 PM      Component Value Range   Glucose-Capillary 362 (*) 70 - 99 (mg/dL)  GLUCOSE, CAPILLARY     Status: Abnormal   Collection Time   12/10/11  9:49 PM      Component Value Range   Glucose-Capillary 356 (*) 70 - 99 (mg/dL)   Comment 1 Documented in Chart     Comment 2 Notify RN    URIC ACID     Status: Normal   Collection Time   12/11/11  5:07 AM      Component Value Range   Uric Acid, Serum 5.6  4.0 - 7.8 (mg/dL)  CBC     Status: Abnormal   Collection Time   12/11/11  5:07 AM      Component Value Range   WBC 6.1  4.0 - 10.5 (K/uL)   RBC 3.77 (*) 4.22 - 5.81 (MIL/uL)   Hemoglobin 10.5 (*) 13.0 - 17.0 (g/dL)   HCT 16.1 (*) 09.6 - 52.0 (%)   MCV 83.3  78.0 - 100.0 (fL)   MCH 27.9  26.0 - 34.0 (pg)   MCHC 33.4  30.0 - 36.0 (g/dL)   RDW 04.5  40.9 - 81.1 (%)   Platelets 258   150 - 400 (K/uL)  DIFFERENTIAL     Status: Normal   Collection Time   12/11/11  5:07 AM      Component Value Range   Neutrophils Relative 46  43 - 77 (%)   Neutro Abs 2.8  1.7 - 7.7 (K/uL)   Lymphocytes Relative 45  12 - 46 (%)   Lymphs Abs 2.8  0.7 - 4.0 (K/uL)   Monocytes Relative 6  3 - 12 (%)   Monocytes Absolute 0.4  0.1 - 1.0 (K/uL)   Eosinophils Relative 3  0 - 5 (%)   Eosinophils Absolute 0.2  0.0 - 0.7 (K/uL)   Basophils Relative 0  0 - 1 (%)   Basophils Absolute 0.0  0.0 - 0.1 (K/uL)  COMPREHENSIVE METABOLIC PANEL     Status: Abnormal   Collection Time   12/11/11  5:07 AM      Component Value Range   Sodium 137  135 - 145 (mEq/L)   Potassium 3.8  3.5 - 5.1 (mEq/L)   Chloride 105  96 -  112 (mEq/L)   CO2 23  19 - 32 (mEq/L)   Glucose, Bld 300 (*) 70 - 99 (mg/dL)   BUN 20  6 - 23 (mg/dL)   Creatinine, Ser 1.61  0.50 - 1.35 (mg/dL)   Calcium 8.1 (*) 8.4 - 10.5 (mg/dL)   Total Protein 6.4  6.0 - 8.3 (g/dL)   Albumin 2.7 (*) 3.5 - 5.2 (g/dL)   AST 14  0 - 37 (U/L)   ALT 14  0 - 53 (U/L)   Alkaline Phosphatase 67  39 - 117 (U/L)   Total Bilirubin 0.1 (*) 0.3 - 1.2 (mg/dL)   GFR calc non Af Amer 72 (*) >90 (mL/min)   GFR calc Af Amer 84 (*) >90 (mL/min)  GLUCOSE, CAPILLARY     Status: Abnormal   Collection Time   12/11/11  7:42 AM      Component Value Range   Glucose-Capillary 228 (*) 70 - 99 (mg/dL)  GLUCOSE, CAPILLARY     Status: Abnormal   Collection Time   12/11/11 11:33 AM      Component Value Range   Glucose-Capillary 325 (*) 70 - 99 (mg/dL)  .    Micro Results: Recent Results (from the past 240 hour(s))  URINE CULTURE     Status: Normal   Collection Time   12/08/11  5:28 PM      Component Value Range Status Comment   Specimen Description URINE, CLEAN CATCH   Final    Special Requests NONE   Final    Culture  Setup Time [id number]   Final    Colony Count NO GROWTH   Final    Culture NO GROWTH   Final    Report Status 12/09/2011 FINAL   Final      Studies/Results: Dg Chest 2 View  12/08/2011  *RADIOLOGY REPORT*  Clinical Data: Hyperglycemia.  Chest pain.  Short of breath.  CHEST - 2 VIEW  Comparison: 08/24/2011.  Findings:  Cardiopericardial silhouette within normal limits. Mediastinal contours normal. Trachea midline.  No airspace disease or effusion. Monitoring leads are projected over the chest.  IMPRESSION: No acute cardiopulmonary disease.  Original Report Authenticated By: Andreas Newport, M.D.   Ct Head Wo Contrast  12/08/2011  *RADIOLOGY REPORT*  Clinical Data: Elevated blood sugar.  Hearing voices and chronically depressed.  Confusion.  CT HEAD WITHOUT CONTRAST  Technique:  Contiguous axial images were obtained from the base of the skull through the vertex without contrast.  Comparison: 09/28/2003  Findings: Bone windows demonstrate clear paranasal sinuses and mastoid air cells.  Soft tissue windows demonstrate no  mass lesion, hemorrhage, hydrocephalus, acute infarct, intra-axial, or extra-axial fluid collection.  IMPRESSION: Normal head CT.  Original Report Authenticated By: Consuello Bossier, M.D.   Medications: Scheduled Meds:   . allopurinol  150 mg Oral BID  . aspirin EC  81 mg Oral Daily  . colchicine  0.6 mg Oral TID  . enoxaparin  40 mg Subcutaneous Q24H  . insulin aspart  0-5 Units Subcutaneous QHS  . insulin aspart  0-9 Units Subcutaneous TID WC  . insulin glargine  40 Units Subcutaneous QHS  . labetalol  100 mg Oral BID  . lisinopril  10 mg Oral Daily  . sodium chloride  3 mL Intravenous Q12H  . DISCONTD: cefTRIAXone (ROCEPHIN)  IV  1 g Intravenous Q24H   Continuous Infusions:  PRN Meds:.alum & mag hydroxide-simeth, indomethacin, ondansetron (ZOFRAN) IV, ondansetron, traMADol  Assessment/Plan: #1 nonketotic hyperosmolar hyperglycemia - hemoglobin A1c 16.1.  cbg's not well controlled. Increased lantus, and added premeal coverage.  #2 acute gouty arthritis right knee-right knee left elbow are less tender and  patient is able to move these joints. Plan is to continue colchicine and allopurinol.  #3 Major depression, chronic: had suicidal ideations on admission. Awaiting psych consult for medications and further recommendations.  #4 Hypertension-we'll continue lisinopril  #5 Disposition: recently released from jail after serving 35 years for murder. He is currently trying to find a place to live with his wife. Awaiting psych consult for depression, suicidal thoughts.  #6 CAD- not decompensating.     LOS: 3 days   Phelix Fudala 12/11/2011, 3:19 PM
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
## 2011-12-11 NOTE — Progress Notes (Signed)
Inpatient Diabetes Program Recommendations  AACE/ADA: New Consensus Statement on Inpatient Glycemic Control (2009)  Target Ranges:  Prepandial:   less than 140 mg/dL      Peak postprandial:   less than 180 mg/dL (1-2 hours)      Critically ill patients:  140 - 180 mg/dL   Reason for Visit: Elevated glucose  Results for [name], [name] (MRN [id number]) as of 12/11/2011 13:43  Ref. Range 12/11/2011 07:42 12/11/2011 11:33  Glucose-Capillary Latest Range: 70-99 mg/dL 782 (H) 956 (H)    Inpatient Diabetes Program Recommendations Insulin - Basal: Increase Lantus to 50 units qhs Correction (SSI): Increase to Moderate Novolog Correction Insulin - Meal Coverage: Consider adding Novolog 4 units TID Outpatient Referral: Order outpatient Diabetes education and have patient follow-up with PCP regarding elevated A1C of 16%

## 2011-12-12 DIAGNOSIS — F329 Major depressive disorder, single episode, unspecified: Secondary | ICD-10-CM

## 2011-12-12 DIAGNOSIS — F1020 Alcohol dependence, uncomplicated: Secondary | ICD-10-CM

## 2011-12-12 LAB — GLUCOSE, CAPILLARY: Glucose-Capillary: 351 mg/dL — ABNORMAL HIGH (ref 70–99)

## 2011-12-12 MED ORDER — CITALOPRAM HYDROBROMIDE 20 MG PO TABS
20.0000 mg | ORAL_TABLET | Freq: Every day | ORAL | Status: DC
  Filled 2011-12-12 (×2): qty 1

## 2011-12-12 MED ORDER — LABETALOL HCL 200 MG PO TABS
200.0000 mg | ORAL_TABLET | Freq: Two times a day (BID) | ORAL | Status: DC
  Filled 2011-12-12 (×3): qty 1

## 2011-12-12 MED ORDER — ENALAPRIL MALEATE 20 MG PO TABS: 20.0000 mg | ORAL_TABLET | Freq: Every day | ORAL | Status: DC

## 2011-12-12 MED ORDER — METFORMIN HCL 500 MG PO TABS: 500.0000 mg | ORAL_TABLET | Freq: Two times a day (BID) | ORAL | Status: DC

## 2011-12-12 MED ORDER — CITALOPRAM HYDROBROMIDE 20 MG PO TABS: 20.0000 mg | ORAL_TABLET | Freq: Every day | ORAL | Status: DC

## 2011-12-12 MED ORDER — METFORMIN HCL 1000 MG PO TABS: 1000.0000 mg | ORAL_TABLET | Freq: Two times a day (BID) | ORAL | Status: DC

## 2011-12-12 MED ORDER — LABETALOL HCL 200 MG PO TABS: 200.0000 mg | ORAL_TABLET | Freq: Two times a day (BID) | ORAL | Status: DC

## 2011-12-12 MED ORDER — ALLOPURINOL 150 MG HALF TABLET: 100.0000 mg | ORAL_TABLET | Freq: Every day | ORAL | Status: DC

## 2011-12-12 MED ORDER — INDOMETHACIN 50 MG PO CAPS: 50.0000 mg | ORAL_CAPSULE | Freq: Three times a day (TID) | ORAL | Status: AC | PRN

## 2011-12-12 MED ORDER — INSULIN GLARGINE 100 UNIT/ML ~~LOC~~ SOLN: 45.0000 [IU] | Freq: Every day | SUBCUTANEOUS | Status: DC

## 2011-12-12 NOTE — Progress Notes (Signed)
Pt's BP elevated (188/88). MD notified, new orders received, will continue to monitor.

## 2011-12-12 NOTE — Progress Notes (Signed)
Per RN, Pt needing transport to [facility].  Contacted Social Work Dept Asst Dir, Automotive engineer.  Since Pt has M'caid, Ian Malkin suggesting CSW contact [place] DSS at [phone] to request that they provide transportation for Pt.  If this doesn't work out, Automotive engineer gave CSW permission to use a Conservation officer, nature for transport.  Made several attempts to contact [facility].  Continued to receive a recorded message asking me to continue to hold.  CSW was unable to speak to someone at DSS.  CSW made the decision to use a taxi voucher for Pt.  T/c with Elenor Legato at [facility], a re-entry program.  Mrs. Ladona Ridgel suggested that Pt call her office at [phone] and schedule an Ax to meet with a counselor to determine how her program can best help Pt.  Provided Pt with information on [facility].  Pt and CSW made an appt with Mrs. Ladona Ridgel at [facility] for February 28th at 2:30.  Pt stated his intent to attend this appt.  Provided Pt with a taxi voucher.  Pt to be d/c'd.  Providence Crosby, LCSWA Clinical Social Work [phone]

## 2011-12-12 NOTE — Plan of Care (Deleted)
Problem: Phase I Progression Outcomes Goal: Initial discharge plan identified Outcome: Completed/Met Date Met:  12/12/11 Pt plans to return Home

## 2011-12-12 NOTE — Progress Notes (Signed)
Pt's BP elevated (164/84). MD notified, no new orders received. Daphine Deutscher, Miranda Barberton

## 2011-12-12 NOTE — Plan of Care (Signed)
Problem: Phase II Progression Outcomes Goal: Discharge plan established Outcome: Progressing SW consulting with pt

## 2011-12-12 NOTE — Discharge Summary (Signed)
DISCHARGE SUMMARY  [name]  MR#: [id number]  DOB:Jan 11, 1956  Date of Admission: 12/08/2011 Date of Discharge: 12/12/2011  Attending Physician:Chantia Amalfitano  Patient's PCP:No primary provider on file.  Consults: -Psychiatry consult.   Discharge Diagnoses: Present on Admission:  .Diabetes mellitus .CAD (coronary artery disease) .Chest pain .Nausea & vomiting .Major depression, chronic .Chronic pain .Hypertension .Gout .Altered mental status    Medication List  As of 12/12/2011  2:07 PM   STOP taking these medications         atenolol 50 MG tablet      hydrochlorothiazide 25 MG tablet      ibuprofen 800 MG tablet      indomethacin 25 MG capsule      insulin NPH 100 UNIT/ML injection      insulin regular 100 units/mL injection         TAKE these medications         allopurinol 150 mg Tabs   Commonly known as: ZYLOPRIM   Take 0.5 tablets (150 mg total) by mouth daily.      aspirin EC 81 MG tablet   Take 81 mg by mouth daily.      citalopram 20 MG tablet   Commonly known as: CELEXA   Take 1 tablet (20 mg total) by mouth daily.      enalapril 20 MG tablet   Commonly known as: VASOTEC   Take 20 mg by mouth daily.      insulin glargine 100 UNIT/ML injection   Commonly known as: LANTUS   Inject 45 Units into the skin at bedtime.      labetalol 200 MG tablet   Commonly known as: NORMODYNE   Take 1 tablet (200 mg total) by mouth 2 (two) times daily.      metFORMIN 1000 MG tablet   Commonly known as: GLUCOPHAGE   Take 1 tablet (1,000 mg total) by mouth 2 (two) times daily.      metFORMIN 500 MG tablet   Commonly known as: GLUCOPHAGE   Take 1 tablet (500 mg total) by mouth 2 (two) times daily with a meal.      nitroGLYCERIN 0.4 MG SL tablet   Commonly known as: NITROSTAT   Place 0.4 mg under the tongue every 5 (five) minutes as needed. Chest pain x3 doses          Brief Admit Note:  56 year old male who just got of jail in September and is  homeless presents to the emergency department because of a gout flare, chest pain, has been out of medications for months and now has uncontrolled glucose. He is a known diabetic and has a history of depression and he was admitted to hospitalist service for evaluation of non ketotic hyperosmolar hyperglycemia, and gouty arthritis.    Hospital Course:  #1 nonketotic hyperosmolar hyperglycemia - hemoglobin A1c 16.1. cbg's not well controlled. Increased lantus, and added premeal coverage. And is being discharged on the same.   #2 acute gouty arthritis right knee-right knee left elbow are not tender and patient is able to move these joints. Plan is to continue colchicine and allopurinol on discharge. #3 Major depression, chronic:no suicidal ideations. Suggesting starting the patient on celexa vs lexapro. And abstinence from alcohol and psychiatry signed off as pt does not need further psychiatric needs.  #4 Hypertension-we'll continue lisinopril . #6 CAD- not decompensating.    Day of Discharge BP 172/79  Pulse 86  Temp(Src) 98 F (36.7 C) (Oral)  Resp 16  Ht  5' 3.78" (1.62 m)  Wt 77.3 kg (170 lb 6.7 oz)  BMI 29.45 kg/m2  SpO2 98%  Physical Exam: General appearance: alert, cooperative and not in distress,pallor  Head: Normocephalic, without obvious abnormality, atraumatic  Neck: no adenopathy, no carotid bruit, no JVD, supple, symmetrical, trachea midline and thyroid not enlarged, symmetric, no tenderness/mass/nodules  Lungs: clear to auscultation bilaterally  Heart: regular rate and rhythm, S1, S2 normal, no murmur, click, rub or gallop  Abdomen: soft,, non tender, no organomegaly, bowel sounds are positive  Extremities: Less tenderness and swelling right knee, not warm to touch. no tenderness left elbow  Skin: Normal turgor   Results for orders placed during the hospital encounter of 12/08/11 (from the past 24 hour(s))  GLUCOSE, CAPILLARY     Status: Abnormal   Collection Time    12/11/11  5:15 PM      Component Value Range   Glucose-Capillary 398 (*) 70 - 99 (mg/dL)  GLUCOSE, CAPILLARY     Status: Abnormal   Collection Time   12/11/11  8:44 PM      Component Value Range   Glucose-Capillary 377 (*) 70 - 99 (mg/dL)   Comment 1 Documented in Chart     Comment 2 Notify RN    GLUCOSE, CAPILLARY     Status: Abnormal   Collection Time   12/12/11  8:00 AM      Component Value Range   Glucose-Capillary 239 (*) 70 - 99 (mg/dL)   Comment 1 Notify RN    GLUCOSE, CAPILLARY     Status: Abnormal   Collection Time   12/12/11 12:30 PM      Component Value Range   Glucose-Capillary 351 (*) 70 - 99 (mg/dL)   Comment 1 Notify RN      Disposition: Home   Follow-up Appts: Discharge Orders    Future Orders Please Complete By Expires   Diet - low sodium heart healthy      Discharge instructions      Comments:   Follow up with PCP IN ONE TO TWO WEEKS.   Activity as tolerated - No restrictions           Tests Needing Follow-up: hgba1c in 3 months.   Time spent in discharge (includes decision making & examination of pt): 45 minutes  Signed: Chantry Headen 12/12/2011, 2:07 PM
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
## 2011-12-25 ENCOUNTER — Encounter (HOSPITAL_COMMUNITY): Payer: Self-pay | Admitting: Emergency Medicine

## 2011-12-25 ENCOUNTER — Observation Stay (HOSPITAL_COMMUNITY)
Admission: EM | Admit: 2011-12-25 | Discharge: 2011-12-25 | Disposition: A | Discharge status: Home Health | Payer: Medicaid Other | Source: Ambulatory Visit | Attending: Emergency Medicine | Admitting: Emergency Medicine

## 2011-12-25 ENCOUNTER — Emergency Department (HOSPITAL_COMMUNITY): Payer: Medicaid Other

## 2011-12-25 ENCOUNTER — Other Ambulatory Visit: Payer: Self-pay

## 2011-12-25 DIAGNOSIS — E119 Type 2 diabetes mellitus without complications: Secondary | ICD-10-CM | POA: Insufficient documentation

## 2011-12-25 DIAGNOSIS — M25539 Pain in unspecified wrist: Secondary | ICD-10-CM | POA: Insufficient documentation

## 2011-12-25 DIAGNOSIS — F339 Major depressive disorder, recurrent, unspecified: Secondary | ICD-10-CM | POA: Insufficient documentation

## 2011-12-25 DIAGNOSIS — M109 Gout, unspecified: Principal | ICD-10-CM | POA: Insufficient documentation

## 2011-12-25 DIAGNOSIS — H6690 Otitis media, unspecified, unspecified ear: Secondary | ICD-10-CM | POA: Insufficient documentation

## 2011-12-25 DIAGNOSIS — Z9114 Patient's other noncompliance with medication regimen: Secondary | ICD-10-CM

## 2011-12-25 DIAGNOSIS — I252 Old myocardial infarction: Secondary | ICD-10-CM | POA: Insufficient documentation

## 2011-12-25 DIAGNOSIS — R739 Hyperglycemia, unspecified: Secondary | ICD-10-CM

## 2011-12-25 DIAGNOSIS — Z91148 Patient's other noncompliance with medication regimen for other reason: Secondary | ICD-10-CM

## 2011-12-25 DIAGNOSIS — I10 Essential (primary) hypertension: Secondary | ICD-10-CM | POA: Insufficient documentation

## 2011-12-25 LAB — DIFFERENTIAL
Eosinophils Absolute: 0.1 10*3/uL (ref 0.0–0.7)
Eosinophils Relative: 1 % (ref 0–5)
Lymphs Abs: 2.9 10*3/uL (ref 0.7–4.0)
Monocytes Relative: 7 % (ref 3–12)

## 2011-12-25 LAB — CBC
Hemoglobin: 13 g/dL (ref 13.0–17.0)
MCH: 28.3 pg (ref 26.0–34.0)
MCHC: 34.6 g/dL (ref 30.0–36.0)
MCV: 81.7 fL (ref 78.0–100.0)
RBC: 4.6 MIL/uL (ref 4.22–5.81)

## 2011-12-25 LAB — GLUCOSE, CAPILLARY
Glucose-Capillary: >600 mg/dL (ref 70–99)
Glucose-Capillary: 333 mg/dL — ABNORMAL HIGH (ref 70–99)

## 2011-12-25 LAB — LIPASE, BLOOD: Lipase: 151 U/L — ABNORMAL HIGH (ref 11–59)

## 2011-12-25 LAB — COMPREHENSIVE METABOLIC PANEL
Alkaline Phosphatase: 92 U/L (ref 39–117)
BUN: 20 mg/dL (ref 6–23)
Calcium: 9.4 mg/dL (ref 8.4–10.5)
Creatinine, Ser: 0.93 mg/dL (ref 0.50–1.35)
GFR calc Af Amer: >90 mL/min (ref >90)
Glucose, Bld: 638 mg/dL (ref 70–99)
Total Protein: 8.1 g/dL (ref 6.0–8.3)

## 2011-12-25 LAB — URINALYSIS, ROUTINE W REFLEX MICROSCOPIC
Protein, ur: 100 mg/dL — AB
Urobilinogen, UA: 0.2 mg/dL (ref 0.0–1.0)

## 2011-12-25 LAB — BASIC METABOLIC PANEL
BUN: 19 mg/dL (ref 6–23)
Creatinine, Ser: 1.01 mg/dL (ref 0.50–1.35)
GFR calc Af Amer: >90 mL/min (ref >90)
GFR calc non Af Amer: 81 mL/min — ABNORMAL LOW (ref >90)
Potassium: 4 mEq/L (ref 3.5–5.1)

## 2011-12-25 LAB — URINE MICROSCOPIC-ADD ON

## 2011-12-25 LAB — TROPONIN I: Troponin I: <0.3 ng/mL (ref <0.30)

## 2011-12-25 MED ORDER — SODIUM CHLORIDE 0.9 % IV SOLN
INTRAVENOUS | Status: AC
  Administered 2011-12-25: 4.3 [IU]/h via INTRAVENOUS
  Filled 2011-12-25: qty 1

## 2011-12-25 MED ORDER — MORPHINE SULFATE 4 MG/ML IJ SOLN
4.0000 mg | Freq: Once | INTRAMUSCULAR | Status: AC
  Administered 2011-12-25: 4 mg via INTRAVENOUS
  Filled 2011-12-25: qty 1

## 2011-12-25 MED ORDER — KETOROLAC TROMETHAMINE 30 MG/ML IJ SOLN
30.0000 mg | Freq: Once | INTRAMUSCULAR | Status: AC
  Administered 2011-12-25: 30 mg via INTRAVENOUS
  Filled 2011-12-25: qty 1

## 2011-12-25 MED ORDER — ASPIRIN 81 MG PO CHEW
324.0000 mg | CHEWABLE_TABLET | Freq: Once | ORAL | Status: AC
  Administered 2011-12-25: 324 mg via ORAL
  Filled 2011-12-25: qty 4

## 2011-12-25 MED ORDER — SODIUM CHLORIDE 0.9 % IV SOLN
INTRAVENOUS | Status: DC
  Administered 2011-12-25: 7.2 [IU]/h via INTRAVENOUS

## 2011-12-25 MED ORDER — SODIUM CHLORIDE 0.9 % IV SOLN
INTRAVENOUS | Status: AC
  Administered 2011-12-25 (×2): via INTRAVENOUS

## 2011-12-25 MED ORDER — POTASSIUM CHLORIDE CRYS ER 20 MEQ PO TBCR: 40.0000 meq | EXTENDED_RELEASE_TABLET | Freq: Once | ORAL | Status: DC

## 2011-12-25 MED ORDER — POTASSIUM CHLORIDE CRYS ER 20 MEQ PO TBCR
20.0000 meq | EXTENDED_RELEASE_TABLET | Freq: Two times a day (BID) | ORAL | Status: DC
  Administered 2011-12-25: 20 meq via ORAL
  Filled 2011-12-25: qty 1

## 2011-12-25 MED ORDER — SODIUM CHLORIDE 0.9 % IV BOLUS (SEPSIS)
1000.0000 mL | Freq: Once | INTRAVENOUS | Status: AC
  Administered 2011-12-25: 1000 mL via INTRAVENOUS

## 2011-12-25 NOTE — ED Notes (Signed)
Notified PA of blood glucose of 638.  Second liter NS just completed and rechecking blood sugar

## 2011-12-25 NOTE — ED Notes (Signed)
Printed two old ekgs from [facility]. 

## 2011-12-25 NOTE — ED Provider Notes (Signed)
Medical screening examination/treatment/procedure(s) were conducted as a shared visit with non-physician practitioner(s) and myself.  I personally evaluated the patient during the encounter  The patient presents with numerous complaints today. Primarily he has not been taking his medications for the last couple of weeks. Patient lives in [place] and unfortunately has been homeless. He has not been taking his medications because he has not had the money for them. His blood sugar is significantly elevated at greater than 600. On exam he is alert in no distress with mild tachycardia. Will check labs, x-rays.  Assessed for diabetic ketoacidosis. Likely will require admission for treatment of his hyperglycemia considering the fact that he has poor outpatient resources.  Celene Kras, MD 12/25/11 [phone]

## 2011-12-25 NOTE — ED Notes (Signed)
CSW met with Pt re: transportation issues back to [place]. Pt has been staying at local shelter the last few days trying to get a ride back to [place]. Pt has prescriptions waiting for him in [place] at Pharmacy that he has been unable to access. CSW gave pt information on [facility] (new to his county) for future medical and non-medical transportation. Pt interested in service and states that he will f/u. CSW provided pt a taxi voucher for transportation today since [facility] is not available to provide unscheduled transportation.  Pt appreciative of services while in ED. Frederico Hamman, LCSW [phone]

## 2011-12-25 NOTE — ED Notes (Signed)
In ER via EMS c/o right knee and left hand pain secondary to gout. Patient stated that he has not have any medication for past 2 weeks. Accucheck reads high.

## 2011-12-25 NOTE — ED Provider Notes (Signed)
Patient sent to the CDU on hyperglycemia protocol by Dr. Lynelle Doctor and Kyung Bacca. Patient presented with numerous complaints however after initial evaluation by aforementioned providers, was sent to CDU for hyperglycemia. Patient is resting comfortably without complaint. Will discuss patient's medications and the need for refills however patient has numerous medications waiting at the pharmacy for him to get filled with patient stating he is waiting on his next disability check. Patient was recently seen by social worker and is on Medicaid at his last hospitalization.  Jenness Corner, PA 12/25/11 1215  Jenness Corner, PA 12/25/11 1219  Patient states that he has all his prescriptions that he needs waiting for him to pick up in [place] at [facility]. Patient states he has medications for both gout and diabetes. The case manager/ social worker will come down to ER to discuss with the patient once again has Medicaid and the importance of follow up with primary care. Patient is currently living with a sister in [place] but also has stayed temporarily at the [facility] in [place].  Jenness Corner, [place] 12/25/11 1323
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
## 2011-12-25 NOTE — Discharge Instructions (Signed)
It is very important to followup with your primary care physician who is listed on her Medicaid card for further evaluation and management of your diabetes injured gout. It is also very important to take your medications regularly as directed by previous providers to keep her diabetes under control. Return to emergency department for emergent changing or worsening symptoms.  Diabetes, Frequently Asked Questions WHAT IS DIABETES? Most of the food we eat is turned into glucose (sugar). Our bodies use it for energy. The pancreas makes a hormone called insulin. It helps glucose get into the cells of our bodies. When you have diabetes, your body either does not make enough insulin or cannot use its own insulin as well as it should. This causes sugars to build up in your blood. WHAT ARE THE SYMPTOMS OF DIABETES?  Frequent urination.   Excessive thirst.   Unexplained weight loss.   Extreme hunger.   Blurred vision.   Tingling or numbness in hands or feet.   Feeling very tired much of the time.   Dry, itchy skin.   Sores that are slow to heal.   Yeast infections.  WHAT ARE THE TYPES OF DIABETES? Type 1 Diabetes   About 10% of affected people have this type.   Usually occurs before the age of 65.   Usually occurs in thin to normal weight people.  Type 2 Diabetes  About 90% of affected people have this type.   Usually occurs after the age of 3.   Usually occurs in overweight people.   More likely to have:   A family history of diabetes.   A history of diabetes during pregnancy (gestational diabetes).   High blood pressure.   High cholesterol and triglycerides.  Gestational Diabetes  Occurs in about 4% of pregnancies.   Usually goes away after the baby is born.   More likely to occur in women with:   Family history of diabetes.   Previous gestational diabetes.   Obese.   Over 63 years old.  WHAT IS PRE-DIABETES? Pre-diabetes means your blood glucose is higher  than normal, but lower than the diabetes range. It also means you are at risk of getting type 2 diabetes and heart disease. If you are told you have pre-diabetes, have your blood glucose checked again in 1 to 2 years. WHAT IS THE TREATMENT FOR DIABETES? Treatment is aimed at keeping blood glucose near normal levels at all times. Learning how to manage this yourself is important in treating diabetes. Depending on the type of diabetes you have, your treatment will include one or more of the following:  Monitoring your blood glucose.   Meal planning.   Exercise.   Oral medicine (pills) or insulin.  CAN DIABETES BE PREVENTED? With type 1 diabetes, prevention is more difficult, because the triggers that cause it are not yet known. With type 2 diabetes, prevention is more likely, with lifestyle changes:  Maintain a healthy weight.   Eat healthy.   Exercise.  IS THERE A CURE FOR DIABETES? No, there is no cure for diabetes. There is a lot of research going on that is looking for a cure, and progress is being made. Diabetes can be treated and controlled. People with diabetes can manage their diabetes and lead normal, active lives. SHOULD I BE TESTED FOR DIABETES? If you are at least 57 years old, you should be tested for diabetes. You should be tested again every 3 years. If you are 45 or older and overweight, you may  want to get tested more often. If you are younger than 45, overweight, and have one or more of the following risk factors, you should be tested:  Family history of diabetes.   Inactive lifestyle.   High blood pressure.  WHAT ARE SOME OTHER SOURCES FOR INFORMATION ON DIABETES? The following organizations may help in your search for more information on diabetes: National Diabetes Education Program (NDEP) Internet: SolarDiscussions.es American Diabetes Association Internet: http://www.diabetes.org  Juvenile Diabetes Foundation International Internet:  Wet[employer].is Document Released: 10/10/2003 Document Revised: 09/26/2011 Document Reviewed: 08/04/2009 [facility] Patient Information 2012 [place], [place].  Chest Pain (Nonspecific) Chest pain has many causes. Your pain could be caused by something serious, such as a heart attack or a blood clot in the lungs. It could also be caused by something less serious, such as a chest bruise or a virus. Follow up with your doctor. More lab tests or other studies may be needed to find the cause of your pain. Most of the time, nonspecific chest pain will improve within 2 to 3 days of rest and mild pain medicine. HOME CARE  For chest bruises, you may put ice on the sore area for 15 to 20 minutes, 3 to 4 times a day. Do this only if it makes you or your child feel better.   Put ice in a plastic bag.   Place a towel between the skin and the bag.   Rest for the next 2 to 3 days.   Go back to work if the pain improves.   See your doctor if the pain lasts longer than 1 to 2 weeks.   Only take medicine as told by your doctor.   Quit smoking if you smoke.  GET HELP RIGHT AWAY IF:   There is more pain or pain that spreads to the arm, neck, jaw, back, or belly (abdomen).   You or your child has shortness of breath.   You or your child coughs more than usual or coughs up blood.   You or your child has very bad back or belly pain, feels sick to his or her stomach (nauseous), or throws up (vomits).   You or your child has very bad weakness.   You or your child passes out (faints).   You or your child has a temperature by mouth above 102 F (38.9 C), not controlled by medicine.  Any of these problems may be serious and may be an emergency. Do not wait to see if the problems will go away. Get medical help right away. Call your local emergency services 911 in [place] Do not drive yourself to the hospital. MAKE SURE YOU:   Understand these instructions.   Will watch this condition.   Will get help  right away if you or your child is not doing well or gets worse.  Document Released: 03/25/2008 Document Revised: 09/26/2011 Document Reviewed: 03/25/2008 [facility] Patient Information 2012 [place], [place].  Arthritis - Gout Gout is caused by uric acid crystals forming in the joint. The big toe, foot, ankle, and knee are the joints most often affected.Often uric acid levels in the blood are also elevated. Symptoms develop rapidly, usually over hours. A joint fluid exam may be needed to prove the diagnosis. Acute gout episodes may follow a minor injury, surgery, illness or medication change. Gout occurs more often in men. It tends to be an inherited (passed down from a family member) condition. Your chance of having gout is increased if you take certain medications. These  include water pills (diuretics), aspirin, niacin, and cyclosporine. Kidney disease and alcohol consumption may also increase your chances of getting gout. Months or years can pass between gout attacks.  Treatment includes ice, rest and raising the affected limb until the swelling and pain are better.Anti-inflammatory medicine usually brings about dramatic relief of pain, redness and swelling within 2 to 3 days. Other medications can also be effective. Increase your fluid intake. Do not drink alcohol or eat liver, sweetbreads, or sardines. Long-term gout management may require medicine to lower blood uric acid levels, or changing or stopping diuretics. Please see your caregiver if your condition is not better after 1 to 2 days of treatment.  SEEK IMMEDIATE MEDICAL CARE IF:  You have more serious symptoms such as a fever, skin rash, diarrhea, vomiting, or other joint pains. Document Released: 11/14/2004 Document Revised: 09/26/2011 Document Reviewed: 11/28/2008 [facility] Patient Information 2012 [place], [place].

## 2011-12-25 NOTE — ED Notes (Signed)
Check patient [name] it was 34 notified RN Sabrina of blood sugar

## 2011-12-25 NOTE — ED Notes (Signed)
Pt c/o chest pain and ST 107 on the monitor, Pt c/o cough for couple of weeks, Pt thirsty and sts he is dehydrated.  Pt has pain to right knee and leg from gout.  Pt sts he lives in [place] and has no way to get there and police took him to the shelter.  Pt complains of pain with palpation to back area.  Pt is alert but sleepy.

## 2011-12-25 NOTE — ED Notes (Signed)
Diet tray ordered 

## 2011-12-25 NOTE — ED Notes (Signed)
Pt somnolent. When awoken, pt c/o pain in lower chest/epigastric region. PA made aware.

## 2011-12-25 NOTE — ED Notes (Signed)
Pt requesting something else to eat. Meal tray ordered.

## 2011-12-25 NOTE — ED Notes (Signed)
Social work called and Delice Bison will come and speak with patient.

## 2013-02-12 ENCOUNTER — Inpatient Hospital Stay (HOSPITAL_COMMUNITY)
Admission: EM | Admit: 2013-02-12 | Discharge: 2013-02-14 | DRG: 638 | Disposition: A | Discharge status: Home / Self Care | Payer: Medicaid Other | Attending: Internal Medicine | Admitting: Internal Medicine

## 2013-02-12 ENCOUNTER — Encounter (HOSPITAL_COMMUNITY): Payer: Self-pay

## 2013-02-12 DIAGNOSIS — Z8249 Family history of ischemic heart disease and other diseases of the circulatory system: Secondary | ICD-10-CM

## 2013-02-12 DIAGNOSIS — F329 Major depressive disorder, single episode, unspecified: Secondary | ICD-10-CM

## 2013-02-12 DIAGNOSIS — R739 Hyperglycemia, unspecified: Secondary | ICD-10-CM

## 2013-02-12 DIAGNOSIS — M109 Gout, unspecified: Secondary | ICD-10-CM

## 2013-02-12 DIAGNOSIS — R4182 Altered mental status, unspecified: Secondary | ICD-10-CM

## 2013-02-12 DIAGNOSIS — IMO0002 Reserved for concepts with insufficient information to code with codable children: Secondary | ICD-10-CM

## 2013-02-12 DIAGNOSIS — Z794 Long term (current) use of insulin: Secondary | ICD-10-CM

## 2013-02-12 DIAGNOSIS — R112 Nausea with vomiting, unspecified: Secondary | ICD-10-CM

## 2013-02-12 DIAGNOSIS — R079 Chest pain, unspecified: Secondary | ICD-10-CM

## 2013-02-12 DIAGNOSIS — Z9119 Patient's noncompliance with other medical treatment and regimen: Secondary | ICD-10-CM

## 2013-02-12 DIAGNOSIS — Z9861 Coronary angioplasty status: Secondary | ICD-10-CM

## 2013-02-12 DIAGNOSIS — E1165 Type 2 diabetes mellitus with hyperglycemia: Secondary | ICD-10-CM

## 2013-02-12 DIAGNOSIS — Z91199 Patient's noncompliance with other medical treatment and regimen due to unspecified reason: Secondary | ICD-10-CM

## 2013-02-12 DIAGNOSIS — I10 Essential (primary) hypertension: Secondary | ICD-10-CM

## 2013-02-12 DIAGNOSIS — G8929 Other chronic pain: Secondary | ICD-10-CM

## 2013-02-12 DIAGNOSIS — I252 Old myocardial infarction: Secondary | ICD-10-CM

## 2013-02-12 DIAGNOSIS — I2510 Atherosclerotic heart disease of native coronary artery without angina pectoris: Secondary | ICD-10-CM

## 2013-02-12 DIAGNOSIS — Z809 Family history of malignant neoplasm, unspecified: Secondary | ICD-10-CM

## 2013-02-12 DIAGNOSIS — Z87891 Personal history of nicotine dependence: Secondary | ICD-10-CM

## 2013-02-12 DIAGNOSIS — E1100 Type 2 diabetes mellitus with hyperosmolarity without nonketotic hyperglycemic-hyperosmolar coma (NKHHC): Principal | ICD-10-CM

## 2013-02-12 DIAGNOSIS — E871 Hypo-osmolality and hyponatremia: Secondary | ICD-10-CM | POA: Diagnosis present

## 2013-02-12 NOTE — ED Notes (Signed)
Onset of left sided chest pain with radiation down left arm, while at rest 11pm.

## 2013-02-13 ENCOUNTER — Encounter (HOSPITAL_COMMUNITY): Payer: Self-pay | Admitting: Internal Medicine

## 2013-02-13 ENCOUNTER — Emergency Department (HOSPITAL_COMMUNITY): Payer: Medicaid Other

## 2013-02-13 DIAGNOSIS — I10 Essential (primary) hypertension: Secondary | ICD-10-CM

## 2013-02-13 DIAGNOSIS — M109 Gout, unspecified: Secondary | ICD-10-CM

## 2013-02-13 DIAGNOSIS — E1100 Type 2 diabetes mellitus with hyperosmolarity without nonketotic hyperglycemic-hyperosmolar coma (NKHHC): Secondary | ICD-10-CM | POA: Diagnosis present

## 2013-02-13 DIAGNOSIS — E1165 Type 2 diabetes mellitus with hyperglycemia: Secondary | ICD-10-CM | POA: Diagnosis present

## 2013-02-13 DIAGNOSIS — R079 Chest pain, unspecified: Secondary | ICD-10-CM

## 2013-02-13 DIAGNOSIS — E1101 Type 2 diabetes mellitus with hyperosmolarity with coma: Secondary | ICD-10-CM

## 2013-02-13 LAB — GLUCOSE, CAPILLARY
Glucose-Capillary: 494 mg/dL — ABNORMAL HIGH (ref 70–99)
Glucose-Capillary: 444 mg/dL — ABNORMAL HIGH (ref 70–99)
Glucose-Capillary: 333 mg/dL — ABNORMAL HIGH (ref 70–99)
Glucose-Capillary: 291 mg/dL — ABNORMAL HIGH (ref 70–99)
Glucose-Capillary: 269 mg/dL — ABNORMAL HIGH (ref 70–99)
Glucose-Capillary: 271 mg/dL — ABNORMAL HIGH (ref 70–99)
Glucose-Capillary: 234 mg/dL — ABNORMAL HIGH (ref 70–99)
Glucose-Capillary: 129 mg/dL — ABNORMAL HIGH (ref 70–99)
Glucose-Capillary: 133 mg/dL — ABNORMAL HIGH (ref 70–99)

## 2013-02-13 LAB — BASIC METABOLIC PANEL
BUN: 25 mg/dL — ABNORMAL HIGH (ref 6–23)
Calcium: 9.5 mg/dL (ref 8.4–10.5)
Calcium: 8.4 mg/dL (ref 8.4–10.5)
Creatinine, Ser: 1.06 mg/dL (ref 0.50–1.35)
GFR calc Af Amer: >90 mL/min (ref >90)
GFR calc non Af Amer: 76 mL/min — ABNORMAL LOW (ref >90)
GFR calc non Af Amer: >90 mL/min (ref >90)
Glucose, Bld: 700 mg/dL (ref 70–99)
Glucose, Bld: 262 mg/dL — ABNORMAL HIGH (ref 70–99)
Potassium: 3.7 mEq/L (ref 3.5–5.1)
Sodium: 131 mEq/L — ABNORMAL LOW (ref 135–145)

## 2013-02-13 LAB — MRSA PCR SCREENING: MRSA by PCR: NEGATIVE

## 2013-02-13 LAB — RAPID URINE DRUG SCREEN, HOSP PERFORMED
Amphetamines: NOT DETECTED
Barbiturates: NOT DETECTED
Opiates: NOT DETECTED
Tetrahydrocannabinol: NOT DETECTED

## 2013-02-13 LAB — TROPONIN I
Troponin I: <0.3 ng/mL (ref <0.30)
Troponin I: <0.3 ng/mL (ref <0.30)
Troponin I: <0.3 ng/mL (ref <0.30)

## 2013-02-13 LAB — CBC
HCT: 35.8 % — ABNORMAL LOW (ref 39.0–52.0)
Hemoglobin: 12.5 g/dL — ABNORMAL LOW (ref 13.0–17.0)
MCH: 29.9 pg (ref 26.0–34.0)
MCHC: 34.9 g/dL (ref 30.0–36.0)
RDW: 13.5 % (ref 11.5–15.5)

## 2013-02-13 LAB — URINALYSIS, ROUTINE W REFLEX MICROSCOPIC
Bilirubin Urine: NEGATIVE
Glucose, UA: >1000 mg/dL — AB
Hgb urine dipstick: NEGATIVE
Ketones, ur: NEGATIVE mg/dL
Protein, ur: NEGATIVE mg/dL

## 2013-02-13 LAB — HEPATIC FUNCTION PANEL
Albumin: 3.6 g/dL (ref 3.5–5.2)
Alkaline Phosphatase: 86 U/L (ref 39–117)
Bilirubin, Direct: <0.1 mg/dL (ref 0.0–0.3)
Total Bilirubin: 0.3 mg/dL (ref 0.3–1.2)

## 2013-02-13 LAB — URINE MICROSCOPIC-ADD ON

## 2013-02-13 LAB — URIC ACID: Uric Acid, Serum: 9.4 mg/dL — ABNORMAL HIGH (ref 4.0–7.8)

## 2013-02-13 MED ORDER — ONDANSETRON HCL 4 MG/2ML IJ SOLN: 4.0000 mg | Freq: Four times a day (QID) | INTRAMUSCULAR | Status: DC | PRN

## 2013-02-13 MED ORDER — SODIUM CHLORIDE 0.9 % IV SOLN
1000.0000 mL | Freq: Once | INTRAVENOUS | Status: AC
  Administered 2013-02-13: 1000 mL via INTRAVENOUS

## 2013-02-13 MED ORDER — INSULIN REGULAR HUMAN 100 UNIT/ML IJ SOLN
INTRAMUSCULAR | Status: AC
  Filled 2013-02-13: qty 3

## 2013-02-13 MED ORDER — DEXTROSE 50 % IV SOLN: 25.0000 mL | INTRAVENOUS | Status: DC | PRN

## 2013-02-13 MED ORDER — INSULIN ASPART 100 UNIT/ML ~~LOC~~ SOLN
0.0000 [IU] | Freq: Every day | SUBCUTANEOUS | Status: DC
  Administered 2013-02-13: 2 [IU] via SUBCUTANEOUS

## 2013-02-13 MED ORDER — COLCHICINE 0.6 MG PO TABS
0.6000 mg | ORAL_TABLET | Freq: Two times a day (BID) | ORAL | Status: AC
  Administered 2013-02-13 (×2): 0.6 mg via ORAL
  Filled 2013-02-13 (×2): qty 1

## 2013-02-13 MED ORDER — INSULIN REGULAR BOLUS VIA INFUSION
0.0000 [IU] | Freq: Three times a day (TID) | INTRAVENOUS | Status: DC
  Administered 2013-02-13 (×3): 6 [IU] via INTRAVENOUS
  Filled 2013-02-13: qty 10

## 2013-02-13 MED ORDER — SERTRALINE HCL 50 MG PO TABS
50.0000 mg | ORAL_TABLET | Freq: Every day | ORAL | Status: DC
  Administered 2013-02-13 – 2013-02-14 (×2): 50 mg via ORAL
  Filled 2013-02-13 (×2): qty 1

## 2013-02-13 MED ORDER — SODIUM CHLORIDE 0.9 % IV SOLN
INTRAVENOUS | Status: DC
  Administered 2013-02-13: 4.3 [IU]/h via INTRAVENOUS
  Filled 2013-02-13: qty 1

## 2013-02-13 MED ORDER — ATENOLOL 25 MG PO TABS
100.0000 mg | ORAL_TABLET | Freq: Every day | ORAL | Status: DC
  Administered 2013-02-13 – 2013-02-14 (×2): 100 mg via ORAL
  Filled 2013-02-13 (×2): qty 4

## 2013-02-13 MED ORDER — ASPIRIN 81 MG PO CHEW
324.0000 mg | CHEWABLE_TABLET | Freq: Once | ORAL | Status: AC
  Administered 2013-02-13: 324 mg via ORAL
  Filled 2013-02-13: qty 4

## 2013-02-13 MED ORDER — NITROGLYCERIN 2 % TD OINT
0.5000 [in_us] | TOPICAL_OINTMENT | Freq: Four times a day (QID) | TRANSDERMAL | Status: DC
  Administered 2013-02-13 – 2013-02-14 (×5): 0.5 [in_us] via TOPICAL
  Filled 2013-02-13 (×4): qty 1

## 2013-02-13 MED ORDER — ENALAPRIL MALEATE 5 MG PO TABS
20.0000 mg | ORAL_TABLET | Freq: Every day | ORAL | Status: DC
  Administered 2013-02-13 – 2013-02-14 (×2): 20 mg via ORAL
  Filled 2013-02-13 (×2): qty 4

## 2013-02-13 MED ORDER — INSULIN ASPART 100 UNIT/ML ~~LOC~~ SOLN
0.0000 [IU] | Freq: Three times a day (TID) | SUBCUTANEOUS | Status: DC
  Administered 2013-02-14: 11 [IU] via SUBCUTANEOUS
  Administered 2013-02-14: 8 [IU] via SUBCUTANEOUS

## 2013-02-13 MED ORDER — PANTOPRAZOLE SODIUM 40 MG PO TBEC
40.0000 mg | DELAYED_RELEASE_TABLET | Freq: Every day | ORAL | Status: DC
  Administered 2013-02-13 – 2013-02-14 (×2): 40 mg via ORAL
  Filled 2013-02-13 (×2): qty 1

## 2013-02-13 MED ORDER — ENOXAPARIN SODIUM 40 MG/0.4ML ~~LOC~~ SOLN
40.0000 mg | SUBCUTANEOUS | Status: DC
  Administered 2013-02-13: 40 mg via SUBCUTANEOUS
  Filled 2013-02-13: qty 0.4

## 2013-02-13 MED ORDER — SODIUM CHLORIDE 0.9 % IV SOLN
INTRAVENOUS | Status: DC
  Administered 2013-02-13: 13.4 [IU]/h via INTRAVENOUS
  Administered 2013-02-13: 3.3 [IU]/h via INTRAVENOUS
  Filled 2013-02-13 (×2): qty 1

## 2013-02-13 MED ORDER — SODIUM CHLORIDE 0.9 % IV SOLN
INTRAVENOUS | Status: DC
  Administered 2013-02-13: 1000 mL via INTRAVENOUS

## 2013-02-13 MED ORDER — SODIUM CHLORIDE 0.9 % IV SOLN
1000.0000 mL | INTRAVENOUS | Status: DC
  Administered 2013-02-13: 1000 mL via INTRAVENOUS

## 2013-02-13 MED ORDER — INSULIN GLARGINE 100 UNIT/ML ~~LOC~~ SOLN
20.0000 [IU] | Freq: Every day | SUBCUTANEOUS | Status: DC
  Administered 2013-02-13: 20 [IU] via SUBCUTANEOUS
  Filled 2013-02-13 (×2): qty 0.2

## 2013-02-13 MED ORDER — ACETAMINOPHEN 325 MG PO TABS: 650.0000 mg | ORAL_TABLET | Freq: Four times a day (QID) | ORAL | Status: DC | PRN

## 2013-02-13 MED ORDER — SODIUM CHLORIDE 0.9 % IJ SOLN
3.0000 mL | Freq: Two times a day (BID) | INTRAMUSCULAR | Status: DC
  Administered 2013-02-13: 3 mL via INTRAVENOUS

## 2013-02-13 MED ORDER — ONDANSETRON HCL 4 MG PO TABS: 4.0000 mg | ORAL_TABLET | Freq: Four times a day (QID) | ORAL | Status: DC | PRN

## 2013-02-13 MED ORDER — ARIPIPRAZOLE 10 MG PO TABS
5.0000 mg | ORAL_TABLET | Freq: Every day | ORAL | Status: DC
  Administered 2013-02-14: 5 mg via ORAL
  Filled 2013-02-13: qty 1

## 2013-02-13 MED ORDER — DEXTROSE-NACL 5-0.45 % IV SOLN
INTRAVENOUS | Status: DC
  Administered 2013-02-13: 100 mL via INTRAVENOUS

## 2013-02-13 MED ORDER — SODIUM CHLORIDE 0.9 % IV SOLN
INTRAVENOUS | Status: DC
  Administered 2013-02-13: 75 mL via INTRAVENOUS
  Administered 2013-02-14: 06:00:00 via INTRAVENOUS

## 2013-02-13 MED ORDER — GUAIFENESIN-CODEINE 100-10 MG/5ML PO SOLN
10.0000 mL | Freq: Four times a day (QID) | ORAL | Status: DC | PRN
  Administered 2013-02-13 – 2013-02-14 (×2): 10 mL via ORAL
  Filled 2013-02-13: qty 5
  Filled 2013-02-13: qty 10
  Filled 2013-02-13: qty 5

## 2013-02-13 MED ORDER — ASPIRIN EC 81 MG PO TBEC
81.0000 mg | DELAYED_RELEASE_TABLET | Freq: Every day | ORAL | Status: DC
  Administered 2013-02-13 – 2013-02-14 (×2): 81 mg via ORAL
  Filled 2013-02-13 (×2): qty 1

## 2013-02-13 MED ORDER — ALBUTEROL SULFATE (5 MG/ML) 0.5% IN NEBU: 2.5000 mg | INHALATION_SOLUTION | RESPIRATORY_TRACT | Status: DC | PRN

## 2013-02-13 MED ORDER — ACETAMINOPHEN 650 MG RE SUPP: 650.0000 mg | Freq: Four times a day (QID) | RECTAL | Status: DC | PRN

## 2013-02-13 MED ORDER — INSULIN GLARGINE 100 UNIT/ML ~~LOC~~ SOLN
SUBCUTANEOUS | Status: AC
  Filled 2013-02-13: qty 10

## 2013-02-13 MED ORDER — ALLOPURINOL 300 MG PO TABS
150.0000 mg | ORAL_TABLET | Freq: Every day | ORAL | Status: DC
  Administered 2013-02-13 – 2013-02-14 (×2): 150 mg via ORAL
  Filled 2013-02-13 (×2): qty 1

## 2013-02-13 NOTE — ED Provider Notes (Signed)
0200 Assumed care/disposition of patient. Patient with h/o CAD, HTN, Gout, chronic pain here with chest pain and radiation of pain down his left arm. Troponin negative. Sugar is 700. Started on glucomander. Will arrange for admission. 0400 Spoke with patient who did not want to be admitted. He needs all his prescriptions renewed as he has been out for 3 weeks since he got out of prison. He is having a gout flair, his blood pressure is elevated and his sugar is elevated. He decided admission will be the right decision for modifying his sugar and getting prescriptions. 4:03 AM:  T/C to Dr. Rito Ehrlich, hospitalist, case discussed, including:  HPI, pertinent PM/SHx, VS/PE, dx testing, ED course and treatment.  Agreeable to admission for observation. .  Requests to write temporary orders, telemetry bed to team 1.   Results for orders placed during the hospital encounter of 02/12/13  CBC      Result Value Range   WBC 6.3  4.0 - 10.5 K/uL   RBC 4.18 (*) 4.22 - 5.81 MIL/uL   Hemoglobin 12.5 (*) 13.0 - 17.0 g/dL   HCT 65.7 (*) 84.6 - 96.2 %   MCV 85.6  78.0 - 100.0 fL   MCH 29.9  26.0 - 34.0 pg   MCHC 34.9  30.0 - 36.0 g/dL   RDW 95.2  84.1 - 32.4 %   Platelets 308  150 - 400 K/uL  BASIC METABOLIC PANEL      Result Value Range   Sodium 126 (*) 135 - 145 mEq/L   Potassium 4.3  3.5 - 5.1 mEq/L   Chloride 89 (*) 96 - 112 mEq/L   CO2 22  19 - 32 mEq/L   Glucose, Bld 700 (*) 70 - 99 mg/dL   BUN 25 (*) 6 - 23 mg/dL   Creatinine, Ser 4.01  0.50 - 1.35 mg/dL   Calcium 9.5  8.4 - 02.7 mg/dL   GFR calc non Af Amer 76 (*) >90 mL/min   GFR calc Af Amer 88 (*) >90 mL/min  TROPONIN I      Result Value Range   Troponin I <0.30  <0.30 ng/mL  URINALYSIS, ROUTINE W REFLEX MICROSCOPIC      Result Value Range   Color, Urine YELLOW  YELLOW   APPearance CLEAR  CLEAR   Specific Gravity, Urine <1.005 (*) 1.005 - 1.030   pH 6.0  5.0 - 8.0   Glucose, UA >1000 (*) NEGATIVE mg/dL   Hgb urine dipstick NEGATIVE   NEGATIVE   Bilirubin Urine NEGATIVE  NEGATIVE   Ketones, ur NEGATIVE  NEGATIVE mg/dL   Protein, ur NEGATIVE  NEGATIVE mg/dL   Urobilinogen, UA 0.2  0.0 - 1.0 mg/dL   Nitrite NEGATIVE  NEGATIVE   Leukocytes, UA NEGATIVE  NEGATIVE  URINE RAPID DRUG SCREEN (HOSP PERFORMED)      Result Value Range   Opiates NONE DETECTED  NONE DETECTED   Cocaine NONE DETECTED  NONE DETECTED   Benzodiazepines NONE DETECTED  NONE DETECTED   Amphetamines NONE DETECTED  NONE DETECTED   Tetrahydrocannabinol NONE DETECTED  NONE DETECTED   Barbiturates NONE DETECTED  NONE DETECTED  URINE MICROSCOPIC-ADD ON      Result Value Range   Squamous Epithelial / LPF RARE  RARE   WBC, UA 0-2  <3 WBC/hpf   RBC / HPF 0-2  <3 RBC/hpf   Bacteria, UA RARE  RARE  GLUCOSE, CAPILLARY      Result Value Range   Glucose-Capillary 534 (*)  70 - 99 mg/dL   Comment 1 Notify RN    GLUCOSE, CAPILLARY      Result Value Range   Glucose-Capillary 494 (*) 70 - 99 mg/dL    Nicoletta Dress. Colon Branch, MD 02/13/13 1610

## 2013-02-13 NOTE — ED Provider Notes (Signed)
Medical screening examination/treatment/procedure(s) were conducted as a shared visit with non-physician practitioner(s) and myself.  I personally evaluated the patient during the encounter  Nicoletta Dress. Colon Branch, MD 02/13/13 [phone]

## 2013-02-13 NOTE — ED Notes (Signed)
Pt ambulated to the restroom without assistance

## 2013-02-13 NOTE — ED Provider Notes (Addendum)
History     CSN: [id number]  Arrival date & time 02/12/13  2324   First MD Initiated Contact with Patient 02/12/13 2358      Chief Complaint  Patient presents with  . Chest Pain    (Consider location/radiation/quality/duration/timing/severity/associated sxs/prior treatment) HPI Comments: [name] is a 57 y.o. Male presenting with now resolved sharp, left sided chest pain which lasted for an hour before arrival here.  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  His symptoms tonight are not similar to his episodes of prior MI, with those episodes consisting of a dull pressure, sob, diaphoresis and numbness,  Which he did not experience tonight, per the detail below.  He does state he ran out of his medications several weeks ago, including his enalapril and his hctz.     Patient is a 57 y.o. male presenting with chest pain. The history is provided by the patient.  Chest Pain Pain location:  L chest Pain quality: sharp   Pain radiates to:  L arm Pain radiates to the back: no   Pain severity:  Moderate Onset quality:  Sudden Duration:  1 hour Timing:  Constant Progression:  Resolved Chronicity:  New Context: at rest   Relieved by:  None tried Worsened by:  Nothing tried Ineffective treatments:  None tried Associated symptoms: weakness   Associated symptoms: no abdominal pain, no diaphoresis, no dizziness, no heartburn, no nausea, no numbness, no orthopnea, no palpitations, no shortness of breath and not vomiting   Risk factors: coronary artery disease, diabetes mellitus and hypertension     Past Medical History  Diagnosis Date  . Diabetes mellitus   . Hypertension   . Gout   . MI (myocardial infarction)   . Chronic pain     right elbow  . Major depression, chronic     Past Surgical History  Procedure Laterality Date  . Coronary angioplasty with stent placement    . Knee surgery      History reviewed. No pertinent family  history.  History  Substance Use Topics  . Smoking status: Former Games developer  . Smokeless tobacco: Never Used  . Alcohol Use: No      Review of Systems  Constitutional: Negative for diaphoresis.  Respiratory: Negative for shortness of breath.   Cardiovascular: Positive for chest pain. Negative for palpitations and orthopnea.  Gastrointestinal: Negative for heartburn, nausea, vomiting and abdominal pain.  Neurological: Positive for weakness. Negative for dizziness and numbness.    Allergies  Review of patient's allergies indicates no known allergies.  Home Medications   Current Outpatient Rx  Name  Route  Sig  Dispense  Refill  . EXPIRED: citalopram (CELEXA) 20 MG tablet   Oral   Take 1 tablet (20 mg total) by mouth daily.   30 tablet   0   . enalapril (VASOTEC) 20 MG tablet   Oral   Take 1 tablet (20 mg total) by mouth daily.   30 tablet   1   . hydrochlorothiazide (HYDRODIURIL) 25 MG tablet   Oral   Take 25 mg by mouth daily.         [name] allopurinol (ZYLOPRIM) 150 mg TABS   Oral   Take 75 mg by mouth daily.         [name] aspirin EC 81 MG tablet   Oral   Take 81 mg by mouth daily.           [name]  EXPIRED: insulin glargine (LANTUS) 100 UNIT/ML injection   Subcutaneous   Inject 45 Units into the skin at bedtime.   10 mL   1   . EXPIRED: labetalol (NORMODYNE) 200 MG tablet   Oral   Take 1 tablet (200 mg total) by mouth 2 (two) times daily.   60 tablet   0   . EXPIRED: metFORMIN (GLUCOPHAGE) 500 MG tablet   Oral   Take 1 tablet (500 mg total) by mouth 2 (two) times daily with a meal.   60 tablet   0   . nitroGLYCERIN (NITROSTAT) 0.4 MG SL tablet   Sublingual   Place 0.4 mg under the tongue every 5 (five) minutes as needed. Chest pain x3 doses            BP 165/83  Pulse 93  Temp(Src) 98.3 F (36.8 C) (Oral)  Resp 18  Ht 5\' 7"  (1.702 m)  Wt 171 lb (77.565 kg)  BMI 26.78 kg/m2  SpO2 97%  Physical Exam  Nursing note and vitals  reviewed. Constitutional: He appears well-developed and well-nourished.  HENT:  Head: Normocephalic and atraumatic.  Eyes: Conjunctivae are normal.  Neck: Normal range of motion.  Cardiovascular: Normal rate, regular rhythm, normal heart sounds and intact distal pulses.   Pulmonary/Chest: Effort normal and breath sounds normal. He has no wheezes.  Abdominal: Soft. Bowel sounds are normal. There is no tenderness.  Musculoskeletal: Normal range of motion. He exhibits no edema.  Neurological: He is alert.  Skin: Skin is warm and dry.  Psychiatric: He has a normal mood and affect.    ED Course  Procedures (including critical care time)  Labs Reviewed  CBC - Abnormal; Notable for the following:    RBC 4.18 (*)    Hemoglobin 12.5 (*)    HCT 35.8 (*)    All other components within normal limits  BASIC METABOLIC PANEL  TROPONIN I  URINALYSIS, ROUTINE W REFLEX MICROSCOPIC  URINE RAPID DRUG SCREEN (HOSP PERFORMED)   Dg Chest Portable 1 View  02/13/2013  *RADIOLOGY REPORT*  Clinical Data: Chest pain  PORTABLE CHEST - 1 VIEW  Comparison: 12/25/2011  Findings: Cardiac leads overlie the chest.  Heart size is normal. Lung volumes are low but clear.  No pleural effusion.  No acute osseous finding.  IMPRESSION: Normal chest.   Original Report Authenticated By: Christiana Pellant, M.D.      No diagnosis found.    MDM  Pt currently pending lab results.  Discussed with Dr. Colon Branch who will follow patient.      Date: 02/12/2013  Rate: 94  Rhythm: normal sinus rhythm  QRS Axis: normal  Intervals: normal  ST/T Wave abnormalities: nonspecific ST changes  Conduction Disutrbances:nonspecific intraventricular conduction delay  Narrative Interpretation: Q waves in inferior leads  Old EKG Reviewed: none available          Burgess Amor, PA-C 02/13/13 0140  Burgess Amor, PA-C 02/23/13 1708

## 2013-02-13 NOTE — Progress Notes (Signed)
Utilization Review Completed.   Lexton Hidalgo, RN, BSN Nurse Case Manager  [phone]  

## 2013-02-13 NOTE — Progress Notes (Signed)
Notified Dr. Lilly Cove that pt is complaining of a cough that causes his chest to hurt sometimes.

## 2013-02-13 NOTE — Progress Notes (Signed)
     Subjective: This 57 year old man was admitted yesterday with left-sided chest pain which he describes as a tightening and associated numbness in the left arm. Cardiac enzymes twice are negative. He also was found to have significantly elevated glucose around 700 and is on an insulin drip. He feels much improved compared to yesterday. He is on a nitroglycerin patch. He does complain of chest pain when he coughs.           Physical Exam: Blood pressure 145/86, pulse 82, temperature 98.4 F (36.9 C), temperature source Oral, resp. rate 16, height 5\' 6"  (1.676 m), weight 76 kg (167 lb 8.8 oz), SpO2 99.00%. He looks systemically well and there is no evidence of acute pain. Heart sounds are present and normal without murmurs or added sounds. Lung fields are clear. He is alert and orientated. His abdomen is soft nontender.   Investigations:  Recent Results (from the past 240 hour(s))  MRSA PCR SCREENING     Status: None   Collection Time    02/13/13  5:00 AM      Result Value Range Status   MRSA by PCR NEGATIVE  NEGATIVE Final   Comment:            The GeneXpert MRSA Assay (FDA     approved for NASAL specimens     only), is one component of a     comprehensive MRSA colonization     surveillance program. It is not     intended to diagnose MRSA     infection nor to guide or     monitor treatment for     MRSA infections.     Basic Metabolic Panel:  Recent Labs  16/10/96 0034  NA 126*  K 4.3  CL 89*  CO2 22  GLUCOSE 700*  BUN 25*  CREATININE 1.06  CALCIUM 9.5   Liver Function Tests:  Recent Labs  02/13/13 0527  AST 10  ALT 10  ALKPHOS 86  BILITOT 0.3  PROT 7.5  ALBUMIN 3.6     CBC:  Recent Labs  02/13/13 0034  WBC 6.3  HGB 12.5*  HCT 35.8*  MCV 85.6  PLT 308    Dg Chest Portable 1 View  02/13/2013  *RADIOLOGY REPORT*  Clinical Data: Chest pain  PORTABLE CHEST - 1 VIEW  Comparison: 12/25/2011  Findings: Cardiac leads overlie the chest.  Heart  size is normal. Lung volumes are low but clear.  No pleural effusion.  No acute osseous finding.  IMPRESSION: Normal chest.   Original Report Authenticated By: Christiana Pellant, M.D.       Medications: I have reviewed the patient's current medications.  Impression: 1. Left-sided chest pain, no evidence of myocardial ischemia or infarction at the present time. 2. Uncontrolled type 2 diabetes mellitus, nonketotic. 3. Hypertension. 4. Hyponatremia secondary to hyperosmolar nonketotic diabetic state.     Plan: 1. Continue intravenous insulin until sugars stabilized upon which we can transition him to subcutaneous insulin. 2. Monitor electrolytes closely.     LOS: 1 day   Wilson Singer Pager [phone]  02/13/2013, 7:42 AM

## 2013-02-13 NOTE — ED Notes (Signed)
CRITICAL VALUE ALERT  Critical value received:  Glucose 700  Date of notification:  02/13/2013  Time of notification:  0150  Critical value read back:yes  Nurse who received alert:  Villa Herb  MD notified (1st page):  Idol,PA  Time of first page:  0150  MD notified (2nd page):  Time of second page:  Responding MD:  Lincoln Maxin, PA  Time MD responded:  [phone]

## 2013-02-13 NOTE — Plan of Care (Signed)
Problem: Problem: Diabetes Management Progression Goal: HYPERGYCEMIA - GLUCOSE GREATER THAN 180 MG/DL Outcome: Progressing Admitted with blood sugar of 700, on glucostabilizer at present

## 2013-02-13 NOTE — Progress Notes (Signed)
Patient being transferred to ICU/Stepdown due to insulin drip.  Bed assignment replaced from 318.

## 2013-02-13 NOTE — H&P (Signed)
[facility] History and Physical  [name] ZOX:[id number] DOB: 1956/06/29 DOA: 02/12/2013   PCP: No PCP Per Patient  Specialists: None  Chief Complaint: Chest pain  HPI: [name] is a 57 y.o. male with a past medical history of coronary artery disease, hypertension, diabetes on insulin, gout, and depression, who was in his usual state of health earlier tonight, when he was lying down and experienced left-sided chest pain, which was described as a tightness, and a squeezing sensation. It was 8/10 in intensity. He felt as if his left shoulder and his left arm were numb. He got concerned and decided to come in to the hospital. Denied any shortness of breath, per se, but did feel tired. Had palpitations. Denied any dizziness. He did feel hot and sweaty. No nausea. Admitted to leg swelling occasionally. Has had a cough with yellowish-green expectoration recently, but denies any fever. Denies any symptoms of acid reflux. He tells me that he's had 2 'heart attacks', one in 2005 when he was at [facility] and then again in 2008, when he was admitted to a hospital in [place], [place]. He underwent a cardiac catheterization at that time, but does not know if a stent was placed or not. According to previous records from our system apparently, a stent was placed in unknown vessel. And, then he was also admitted in [facility] 2 weeks ago for high blood sugar and chest pain. He was discharged from there with prescription however, the patient hasn't filled his prescriptions due to financial reason. He tells me that he got out of prison in January and ran out of his medications about 3-4 weeks ago. He, says his chest pain is improved, and it's about 3/10 in intensity. He hasn't received any medications for this pain yet.  Home Medications: Prior to Admission medications   Medication Sig Start Date End Date Taking? Authorizing Provider  allopurinol (ZYLOPRIM) 300 MG tablet Take 150  mg by mouth daily.    Historical Provider, MD  ARIPiprazole (ABILIFY) 5 MG tablet Take 5 mg by mouth daily.    Historical Provider, MD  aspirin EC 81 MG tablet Take 81 mg by mouth daily.      Historical Provider, MD  atenolol (TENORMIN) 100 MG tablet Take 100 mg by mouth daily.    Historical Provider, MD  enalapril (VASOTEC) 20 MG tablet Take 1 tablet (20 mg total) by mouth daily. 12/12/11   Kathlen Mody, MD  hydrochlorothiazide (HYDRODIURIL) 25 MG tablet Take 25 mg by mouth daily.    Historical Provider, MD  insulin NPH (HUMULIN N,NOVOLIN N) 100 UNIT/ML injection Inject 26-36 Units into the skin. 36 units in am and 26 units in pm    Historical Provider, MD  metFORMIN (GLUCOPHAGE) 1000 MG tablet Take 1,000 mg by mouth 2 (two) times daily with a meal.    Historical Provider, MD  nitroGLYCERIN (NITROSTAT) 0.4 MG SL tablet Place 0.4 mg under the tongue every 5 (five) minutes as needed. Chest pain x3 doses     Historical Provider, MD  sertraline (ZOLOFT) 50 MG tablet Take 50 mg by mouth daily.    Historical Provider, MD    Allergies: No Known Allergies  Past Medical History: Past Medical History  Diagnosis Date  . Diabetes mellitus   . Hypertension   . Gout   . MI (myocardial infarction)   . Chronic pain     right elbow  . Major depression, chronic  Past Surgical History  Procedure Laterality Date  . Coronary angioplasty with stent placement    . Knee surgery      Social History:  reports that he has quit smoking. He has never used smokeless tobacco. He reports that he does not drink alcohol or use illicit drugs.  Living Situation: He is currently living with his sister. Activity Level: Usually independent with daily activities   Family History:  he tells me there is history of cancer, hypertension, gout, heart disease in the family  Review of Systems - History obtained from the patient General ROS: positive for  - fatigue Psychological ROS: negative Ophthalmic ROS:  negative ENT ROS: negative Allergy and Immunology ROS: negative Hematological and Lymphatic ROS: negative Endocrine ROS: negative Respiratory ROS: positive for - cough Cardiovascular ROS: as in hpi Gastrointestinal ROS: no abdominal pain, change in bowel habits, or black or bloody stools Genito-Urinary ROS: no dysuria, trouble voiding, or hematuria Musculoskeletal ROS: joint pains Neurological ROS: no TIA or stroke symptoms Dermatological ROS: negative  Physical Examination  Filed Vitals:   02/12/13 2348 02/13/13 0155 02/13/13 0412  BP: 165/83 163/92 186/98  Pulse: 93 89 84  Temp: 98.3 F (36.8 C)    TempSrc: Oral    Resp: 18 20 20   Height: 5\' 7"  (1.702 m)    Weight: 77.565 kg (171 lb)    SpO2: 97% 97% 99%    General appearance: alert, cooperative, appears stated age and no distress Head: Normocephalic, without obvious abnormality, atraumatic Eyes: conjunctivae/corneas clear. PERRL, EOM's intact. Throat: lips, mucosa, and tongue normal; teeth and gums normal Neck: no adenopathy, no carotid bruit, no JVD, supple, symmetrical, trachea midline and thyroid not enlarged, symmetric, no tenderness/mass/nodules Back: symmetric, no curvature. ROM normal. No CVA tenderness. Resp: clear to auscultation bilaterally Chest wall: no tenderness Cardio: regular rate and rhythm, S1, S2 normal, no murmur, click, rub or gallop GI: Abdomen is soft, but there is tenderness in the epigastric area without any rebound, rigidity, or guarding. No masses, organomegaly appreciated. Bowel sounds are present Extremities: extremities normal, atraumatic, no cyanosis or edema Pulses: 2+ and symmetric Skin: Skin color, texture, turgor normal. No rashes or lesions Lymph nodes: Cervical, supraclavicular, and axillary nodes normal. Neurologic: He is alert and oriented x3. No focal neurological deficits are present  Laboratory Data: Results for orders placed during the hospital encounter of 02/12/13 (from the  past 48 hour(s))  CBC     Status: Abnormal   Collection Time    02/13/13 12:34 AM      Result Value Range   WBC 6.3  4.0 - 10.5 K/uL   RBC 4.18 (*) 4.22 - 5.81 MIL/uL   Hemoglobin 12.5 (*) 13.0 - 17.0 g/dL   HCT 16.1 (*) 09.6 - 04.5 %   MCV 85.6  78.0 - 100.0 fL   MCH 29.9  26.0 - 34.0 pg   MCHC 34.9  30.0 - 36.0 g/dL   RDW 40.9  81.1 - 91.4 %   Platelets 308  150 - 400 K/uL  BASIC METABOLIC PANEL     Status: Abnormal   Collection Time    02/13/13 12:34 AM      Result Value Range   Sodium 126 (*) 135 - 145 mEq/L   Potassium 4.3  3.5 - 5.1 mEq/L   Chloride 89 (*) 96 - 112 mEq/L   CO2 22  19 - 32 mEq/L   Glucose, Bld 700 (*) 70 - 99 mg/dL   Comment: CRITICAL RESULT CALLED  TO, READ BACK BY AND VERIFIED WITH:      BELTON,C @ 0149 ON 02/13/13 BY WOODIE,J   BUN 25 (*) 6 - 23 mg/dL   Creatinine, Ser 9.14  0.50 - 1.35 mg/dL   Calcium 9.5  8.4 - 78.2 mg/dL   GFR calc non Af Amer 76 (*) >90 mL/min   GFR calc Af Amer 88 (*) >90 mL/min   Comment:            The eGFR has been calculated     using the CKD EPI equation.     This calculation has not been     validated in all clinical     situations.     eGFR's persistently     <90 mL/min signify     possible Chronic Kidney Disease.  TROPONIN I     Status: None   Collection Time    02/13/13 12:34 AM      Result Value Range   Troponin I <0.30  <0.30 ng/mL   Comment:            Due to the release kinetics of cTnI,     a negative result within the first hours     of the onset of symptoms does not rule out     myocardial infarction with certainty.     If myocardial infarction is still suspected,     repeat the test at appropriate intervals.  URINALYSIS, ROUTINE W REFLEX MICROSCOPIC     Status: Abnormal   Collection Time    02/13/13  1:00 AM      Result Value Range   Color, Urine YELLOW  YELLOW   APPearance CLEAR  CLEAR   Specific Gravity, Urine <1.005 (*) 1.005 - 1.030   pH 6.0  5.0 - 8.0   Glucose, UA >1000 (*) NEGATIVE mg/dL    Hgb urine dipstick NEGATIVE  NEGATIVE   Bilirubin Urine NEGATIVE  NEGATIVE   Ketones, ur NEGATIVE  NEGATIVE mg/dL   Protein, ur NEGATIVE  NEGATIVE mg/dL   Urobilinogen, UA 0.2  0.0 - 1.0 mg/dL   Nitrite NEGATIVE  NEGATIVE   Leukocytes, UA NEGATIVE  NEGATIVE  URINE RAPID DRUG SCREEN (HOSP PERFORMED)     Status: None   Collection Time    02/13/13  1:00 AM      Result Value Range   Opiates NONE DETECTED  NONE DETECTED   Cocaine NONE DETECTED  NONE DETECTED   Benzodiazepines NONE DETECTED  NONE DETECTED   Amphetamines NONE DETECTED  NONE DETECTED   Tetrahydrocannabinol NONE DETECTED  NONE DETECTED   Barbiturates NONE DETECTED  NONE DETECTED   Comment:            DRUG SCREEN FOR MEDICAL PURPOSES     ONLY.  IF CONFIRMATION IS NEEDED     FOR ANY PURPOSE, NOTIFY LAB     WITHIN 5 DAYS.                LOWEST DETECTABLE LIMITS     FOR URINE DRUG SCREEN     Drug Class       Cutoff (ng/mL)     Amphetamine      1000     Barbiturate      200     Benzodiazepine   200     Tricyclics       300     Opiates          300     Cocaine  300     THC              50  URINE MICROSCOPIC-ADD ON     Status: None   Collection Time    02/13/13  1:00 AM      Result Value Range   Squamous Epithelial / LPF RARE  RARE   WBC, UA 0-2  <3 WBC/hpf   RBC / HPF 0-2  <3 RBC/hpf   Bacteria, UA RARE  RARE  GLUCOSE, CAPILLARY     Status: Abnormal   Collection Time    02/13/13  2:59 AM      Result Value Range   Glucose-Capillary 534 (*) 70 - 99 mg/dL   Comment 1 Notify RN    GLUCOSE, CAPILLARY     Status: Abnormal   Collection Time    02/13/13  4:00 AM      Result Value Range   Glucose-Capillary 494 (*) 70 - 99 mg/dL    Radiology Reports: Dg Chest Portable 1 View  02/13/2013  *RADIOLOGY REPORT*  Clinical Data: Chest pain  PORTABLE CHEST - 1 VIEW  Comparison: 12/25/2011  Findings: Cardiac leads overlie the chest.  Heart size is normal. Lung volumes are low but clear.  No pleural effusion.  No acute  osseous finding.  IMPRESSION: Normal chest.   Original Report Authenticated By: Christiana Pellant, M.D.     Electrocardiogram: EKG shows sinus rhythm at 94 beats per minute. Normal axis. Intervals are normal. Q wave seen in inferior leads. Left ventricle hypertrophy is noted. No other concerning ST changes are noted. T inversion is seen in V6. These changes are all old compared to previous EKGs.  Problem List  Active Problems:   Major depression, chronic   Gout   Hypertension   Noncompliance   CAD (coronary artery disease)   Chest pain   DM (diabetes mellitus), type 2, uncontrolled   Diabetic hyperosmolar non-ketotic state   Assessment: This is a 57 year old, African American male, who presents with left-sided chest pain, and was found to have hyperglycemia, which appears to be nonketotic. Considering his history of heart disease this could have been angina. His initial troponin is negative. His EKG does not show any new changes.  Plan: #1 chest pain: Cycle troponin. Repeat EKG. Continue with aspirin. Continue with his beta blocker and his ACE inhibitor. Nitroglycerin ointment will be utilized for now. If he rules out and, if his pain resolves this could be pursued as an outpatient. He did mention cough with yellowish expectoration. However, his lungs are clear to auscultation and chest x-ray was also clear. Continue to monitor without any specific intervention for now.  #2 hyperglycemia nonketotic state in a known diabetic: He does have the type 2 diabetes which appears to be uncontrolled. Hasn't taken his medication in a few weeks. He will be placed on intravenous insulin. HbA1c will be checked. He'll be transitioned to his usual outpatient regimen once his blood sugar is in target range.  #3 hypertension with left hypertrophy: Resume his beta blockers and his ACE inhibitors. Monitor blood pressure closely.  #4 history of gout: He does have some joint pains at this time. Check his uric  acid. We'll resume his allopurinol and give him colchicine. Continue to monitor.  #5 history of depression: Continue with sertraline.  #6 epigastric tenderness: Reason is unclear. He did not complain of pain but was found to be tender only on examination. We will check a lipase level and check his LFTs. Given Protonix  for now. Continue to monitor. Consider imaging studies if pain worsens.  DVT Prophylaxis: Enoxaparin Code Status: Full code Family Communication: Discussed with the patient  Disposition Plan:  Unclear for now. He will need case manager assistance for medications at the time of discharge.  Further management decisions will depend on results of further testing and patient's response to treatment.  [facility]  [facility] Pager [phone]  If 7PM-7AM, please contact night-coverage [email or web address] Password [facility]  02/13/2013, 4:40 AM

## 2013-02-14 DIAGNOSIS — I2510 Atherosclerotic heart disease of native coronary artery without angina pectoris: Secondary | ICD-10-CM

## 2013-02-14 DIAGNOSIS — R7309 Other abnormal glucose: Secondary | ICD-10-CM

## 2013-02-14 LAB — COMPREHENSIVE METABOLIC PANEL
ALT: 8 U/L (ref 0–53)
AST: 11 U/L (ref 0–37)
CO2: 21 mEq/L (ref 19–32)
Calcium: 8.1 mg/dL — ABNORMAL LOW (ref 8.4–10.5)
Chloride: 104 mEq/L (ref 96–112)
GFR calc Af Amer: >90 mL/min (ref >90)
GFR calc non Af Amer: >90 mL/min (ref >90)
Glucose, Bld: 370 mg/dL — ABNORMAL HIGH (ref 70–99)
Sodium: 135 mEq/L (ref 135–145)
Total Bilirubin: 0.2 mg/dL — ABNORMAL LOW (ref 0.3–1.2)

## 2013-02-14 LAB — GLUCOSE, CAPILLARY: Glucose-Capillary: 291 mg/dL — ABNORMAL HIGH (ref 70–99)

## 2013-02-14 LAB — CBC
Hemoglobin: 11.3 g/dL — ABNORMAL LOW (ref 13.0–17.0)
MCH: 29.8 pg (ref 26.0–34.0)
MCHC: 34.6 g/dL (ref 30.0–36.0)
Platelets: 266 10*3/uL (ref 150–400)
RDW: 13.8 % (ref 11.5–15.5)

## 2013-02-14 LAB — TSH: TSH: 1.149 u[IU]/mL (ref 0.350–4.500)

## 2013-02-14 MED ORDER — ALLOPURINOL 300 MG PO TABS: 150.0000 mg | ORAL_TABLET | Freq: Every day | ORAL | Status: DC

## 2013-02-14 MED ORDER — SERTRALINE HCL 50 MG PO TABS: 50.0000 mg | ORAL_TABLET | Freq: Every day | ORAL | Status: DC

## 2013-02-14 MED ORDER — ENALAPRIL MALEATE 20 MG PO TABS: 20.0000 mg | ORAL_TABLET | Freq: Every day | ORAL | Status: DC

## 2013-02-14 MED ORDER — ARIPIPRAZOLE 5 MG PO TABS: 5.0000 mg | ORAL_TABLET | Freq: Every day | ORAL | Status: DC

## 2013-02-14 MED ORDER — ATENOLOL 100 MG PO TABS: 100.0000 mg | ORAL_TABLET | Freq: Every day | ORAL | Status: DC

## 2013-02-14 MED ORDER — NITROGLYCERIN 0.4 MG SL SUBL: 0.4000 mg | SUBLINGUAL_TABLET | SUBLINGUAL | Status: DC | PRN

## 2013-02-14 MED ORDER — HYDROCHLOROTHIAZIDE 25 MG PO TABS: 25.0000 mg | ORAL_TABLET | Freq: Every day | ORAL | Status: DC

## 2013-02-14 MED ORDER — METFORMIN HCL 1000 MG PO TABS: 1000.0000 mg | ORAL_TABLET | Freq: Two times a day (BID) | ORAL | Status: DC

## 2013-02-14 MED ORDER — NITROGLYCERIN 2 % TD OINT
TOPICAL_OINTMENT | TRANSDERMAL | Status: AC
  Filled 2013-02-14: qty 1

## 2013-02-14 MED ORDER — INSULIN GLARGINE 100 UNIT/ML ~~LOC~~ SOLN: 40.0000 [IU] | Freq: Every day | SUBCUTANEOUS | Status: DC

## 2013-02-14 NOTE — Progress Notes (Signed)
02/14/13 1318 Patient unable to arrange for transport home, stated would just walk home. Notified supervisor, voucher obtained for cab ride home. Patient stated okay. Pt left floor in stable condition via w/c accompanied by nurse tech. Earnstine Regal, RN

## 2013-02-14 NOTE — Progress Notes (Signed)
02/14/13 1052 Reviewed discharge instructions with patient via teachback. Given copy of instructions, medication list, prescriptions, f/u appointment information. Instructed to call MD office tomorrow for appointment, given [facility] number if needed for follow-up care. Pt to see Dr Regino Schultze [facility] physician on admission date) since no current PCP. States will call to set up appointment tomorrow. Reviewed diabetes education handouts. IV site d/c'd and within normal limits this morning. Notified CMT of telemetry d/c'd for discharge this morning. Pt in stable condition awaiting family arrival for transport home. Earnstine Regal, RN

## 2013-02-14 NOTE — Progress Notes (Signed)
Pt was transferred on telemetry by RN to rm 317. Vital signs are stable. Insulin drip was turned off around 6.30 pm. Report was given to RN.

## 2013-02-14 NOTE — Discharge Summary (Signed)
Physician Discharge Summary  [name] WGN:[id number] DOB: [birth year] DOA: 02/12/2013  PCP: No PCP Per Patient  Admit date: 02/12/2013 Discharge date: 02/14/2013  Time spent: Greater than 30 minutes  Recommendations for Outpatient Follow-up:  1. Followup with a primary care physician in the community. May need cardiology referral.  Discharge Diagnoses:  1. Left-sided chest pain, no evidence of myocardial ischemia or infarction. Pain resolved. 2. Diabetic hyperosmolar nonketotic state. 3. Hypertension. 4. History of coronary artery disease, stable. 5. Gout, stable. 6. Depression, stable. 7. Noncompliance   Discharge Condition: Stable and improved.  Diet recommendation: Carbohydrate modified diet.  Filed Weights   02/13/13 0531 02/13/13 2043 02/14/13 0456  Weight: 76 kg (167 lb 8.8 oz) 79.5 kg (175 lb 4.3 oz) 78.5 kg (173 lb 1 oz)    History of present illness:  This 57 year old man presents to the hospital with symptoms of chest pain. Please see initial history as outlined below: HPI: [name] is a 57 y.o. male with a past medical history of coronary artery disease, hypertension, diabetes on insulin, gout, and depression, who was in his usual state of health earlier tonight, when he was lying down and experienced left-sided chest pain, which was described as a tightness, and a squeezing sensation. It was 8/10 in intensity. He felt as if his left shoulder and his left arm were numb. He got concerned and decided to come in to the hospital. Denied any shortness of breath, per se, but did feel tired. Had palpitations. Denied any dizziness. He did feel hot and sweaty. No nausea. Admitted to leg swelling occasionally. Has had a cough with yellowish-green expectoration recently, but denies any fever. Denies any symptoms of acid reflux. He tells me that he's had 2 'heart attacks', one in 2005 when he was at [facility] and then again in 2008, when he was admitted to a hospital in  [place], [place]. He underwent a cardiac catheterization at that time, but does not know if a stent was placed or not. According to previous records from our system apparently, a stent was placed in unknown vessel. And, then he was also admitted in [facility] 2 weeks ago for high blood sugar and chest pain. He was discharged from there with prescription however, the patient hasn't filled his prescriptions due to financial reason. He tells me that he got out of prison in January and ran out of his medications about 3-4 weeks ago. He, says his chest pain is improved, and it's about 3/10 in intensity. He hasn't received any medications for this pain yet.  Hospital Course:  Patient was admitted and found to be in hyperosmolar nonketotic diabetic state and was treated with intravenous fluids and intravenous insulin in the step down unit. Serial cardiac enzymes were negative. His pain resolved. He describes the pain as left-sided chest tightness/squeezing sensation. He apparently just got out of prison and has not had any of his medications to take. He feels much improved now, his blood sugars are better controlled although not optimal still but this can done as an outpatient. He is now stable for discharge. He will need followup with a primary care physician in the community, we will give him the phone number of 1 that was on call when he was admitted. He will need to make sure that he follows up with a primary care physician.  Procedures:  None.   Consultations:  None.  Discharge Exam: Filed Vitals:   02/13/13 2043 02/13/13 2245  02/14/13 0456 02/14/13 0539  BP: 153/80  130/69   Pulse: 72 76 74 72  Temp: 98.2 F (36.8 C)  98.8 F (37.1 C)   TempSrc: Oral  Oral   Resp: 20  20   Height:      Weight: 79.5 kg (175 lb 4.3 oz)  78.5 kg (173 lb 1 oz)   SpO2: 98%  95%     General: He looks systemically well. Cardiovascular: Heart sounds are present and normal without murmurs or added  sounds. Respiratory: Lung fields are clear. He is alert and orientated.  Discharge Instructions  Discharge Orders   Future Orders Complete By Expires     Diet - low sodium heart healthy  As directed     Increase activity slowly  As directed         Medication List    STOP taking these medications       insulin NPH 100 UNIT/ML injection  Commonly known as:  HUMULIN N,NOVOLIN N      TAKE these medications       allopurinol 300 MG tablet  Commonly known as:  ZYLOPRIM  Take 0.5 tablets (150 mg total) by mouth daily.     ARIPiprazole 5 MG tablet  Commonly known as:  ABILIFY  Take 1 tablet (5 mg total) by mouth daily.     aspirin EC 81 MG tablet  Take 81 mg by mouth daily.     atenolol 100 MG tablet  Commonly known as:  TENORMIN  Take 1 tablet (100 mg total) by mouth daily.     enalapril 20 MG tablet  Commonly known as:  VASOTEC  Take 1 tablet (20 mg total) by mouth daily.     hydrochlorothiazide 25 MG tablet  Commonly known as:  HYDRODIURIL  Take 1 tablet (25 mg total) by mouth daily.     insulin glargine 100 UNIT/ML injection  Commonly known as:  LANTUS  Inject 0.4 mLs (40 Units total) into the skin at bedtime.     metFORMIN 1000 MG tablet  Commonly known as:  GLUCOPHAGE  Take 1 tablet (1,000 mg total) by mouth 2 (two) times daily with a meal.     nitroGLYCERIN 0.4 MG SL tablet  Commonly known as:  NITROSTAT  Place 1 tablet (0.4 mg total) under the tongue every 5 (five) minutes as needed. Chest pain x3 doses     sertraline 50 MG tablet  Commonly known as:  ZOLOFT  Take 1 tablet (50 mg total) by mouth daily.          The results of significant diagnostics from this hospitalization (including imaging, microbiology, ancillary and laboratory) are listed below for reference.    Significant Diagnostic Studies: Dg Chest Portable 1 View  02/13/2013  *RADIOLOGY REPORT*  Clinical Data: Chest pain  PORTABLE CHEST - 1 VIEW  Comparison: 12/25/2011  Findings:  Cardiac leads overlie the chest.  Heart size is normal. Lung volumes are low but clear.  No pleural effusion.  No acute osseous finding.  IMPRESSION: Normal chest.   Original Report Authenticated By: Christiana Pellant, M.D.     Microbiology: Recent Results (from the past 240 hour(s))  MRSA PCR SCREENING     Status: None   Collection Time    02/13/13  5:00 AM      Result Value Range Status   MRSA by PCR NEGATIVE  NEGATIVE Final   Comment:            The GeneXpert MRSA  Assay (FDA     approved for NASAL specimens     only), is one component of a     comprehensive MRSA colonization     surveillance program. It is not     intended to diagnose MRSA     infection nor to guide or     monitor treatment for     MRSA infections.     Labs: Basic Metabolic Panel:  Recent Labs Lab 02/13/13 0034 02/13/13 1000 02/14/13 0605  NA 126* 131* 135  K 4.3 3.7 4.3  CL 89* 100 104  CO2 22 21 21   GLUCOSE 700* 262* 370*  BUN 25* 19 17  CREATININE 1.06 0.95 0.92  CALCIUM 9.5 8.4 8.1*   Liver Function Tests:  Recent Labs Lab 02/13/13 0527 02/14/13 0605  AST 10 11  ALT 10 8  ALKPHOS 86 72  BILITOT 0.3 0.2*  PROT 7.5 6.6  ALBUMIN 3.6 3.0*    Recent Labs Lab 02/13/13 0527  LIPASE 86*    CBC:  Recent Labs Lab 02/13/13 0034 02/14/13 0605  WBC 6.3 7.7  HGB 12.5* 11.3*  HCT 35.8* 32.7*  MCV 85.6 86.3  PLT 308 266   Cardiac Enzymes:  Recent Labs Lab 02/13/13 0034 02/13/13 0527 02/13/13 1029 02/13/13 1717  TROPONINI <0.30 <0.30 <0.30 <0.30     CBG:  Recent Labs Lab 02/13/13 1639 02/13/13 1752 02/13/13 1853 02/13/13 2053 02/14/13 0729  GLUCAP 152* 133* 207* 221* 291*       Signed:  GOSRANI,NIMISH C  [facility] 02/14/2013, 8:53 AM

## 2013-02-14 NOTE — Plan of Care (Signed)
Problem: Phase II Progression Outcomes Goal: Patient able to draw up & self administer Insulin Outcome: Completed/Met Date Met:  02/14/13 02/14/13 1048 Patient states has been on insulin at home, comfortable with insulin administration and blood sugar checks/monitoring.   Problem: Discharge Progression Outcomes Goal: Obtain signed CBG meter Rx form Outcome: Not Applicable Date Met:  02/14/13 02/14/13 1049 patient states has blood sugar meter and checks blood sugars at least twice per day.

## 2013-02-15 LAB — HEMOGLOBIN A1C
Hgb A1c MFr Bld: 14.8 % — ABNORMAL HIGH (ref <5.7)
Mean Plasma Glucose: 378 mg/dL — ABNORMAL HIGH (ref <117)

## 2013-02-19 ENCOUNTER — Inpatient Hospital Stay (HOSPITAL_COMMUNITY)
Admission: EM | Admit: 2013-02-19 | Discharge: 2013-02-20 | DRG: 311 | Disposition: A | Discharge status: Home / Self Care | Payer: Medicaid Other | Attending: Internal Medicine | Admitting: Internal Medicine

## 2013-02-19 ENCOUNTER — Emergency Department (HOSPITAL_COMMUNITY): Payer: Medicaid Other

## 2013-02-19 ENCOUNTER — Encounter (HOSPITAL_COMMUNITY): Payer: Self-pay | Admitting: Family Medicine

## 2013-02-19 DIAGNOSIS — M109 Gout, unspecified: Secondary | ICD-10-CM | POA: Diagnosis present

## 2013-02-19 DIAGNOSIS — F1410 Cocaine abuse, uncomplicated: Secondary | ICD-10-CM | POA: Diagnosis present

## 2013-02-19 DIAGNOSIS — Z794 Long term (current) use of insulin: Secondary | ICD-10-CM

## 2013-02-19 DIAGNOSIS — IMO0002 Reserved for concepts with insufficient information to code with codable children: Secondary | ICD-10-CM | POA: Diagnosis present

## 2013-02-19 DIAGNOSIS — I2510 Atherosclerotic heart disease of native coronary artery without angina pectoris: Secondary | ICD-10-CM | POA: Diagnosis present

## 2013-02-19 DIAGNOSIS — Z7982 Long term (current) use of aspirin: Secondary | ICD-10-CM

## 2013-02-19 DIAGNOSIS — E871 Hypo-osmolality and hyponatremia: Secondary | ICD-10-CM

## 2013-02-19 DIAGNOSIS — Z91199 Patient's noncompliance with other medical treatment and regimen due to unspecified reason: Secondary | ICD-10-CM

## 2013-02-19 DIAGNOSIS — F3289 Other specified depressive episodes: Secondary | ICD-10-CM | POA: Diagnosis present

## 2013-02-19 DIAGNOSIS — Z9119 Patient's noncompliance with other medical treatment and regimen: Secondary | ICD-10-CM

## 2013-02-19 DIAGNOSIS — R739 Hyperglycemia, unspecified: Secondary | ICD-10-CM

## 2013-02-19 DIAGNOSIS — E1165 Type 2 diabetes mellitus with hyperglycemia: Secondary | ICD-10-CM | POA: Diagnosis present

## 2013-02-19 DIAGNOSIS — G8929 Other chronic pain: Secondary | ICD-10-CM | POA: Diagnosis present

## 2013-02-19 DIAGNOSIS — I252 Old myocardial infarction: Secondary | ICD-10-CM

## 2013-02-19 DIAGNOSIS — I10 Essential (primary) hypertension: Secondary | ICD-10-CM | POA: Diagnosis present

## 2013-02-19 DIAGNOSIS — I200 Unstable angina: Principal | ICD-10-CM | POA: Diagnosis present

## 2013-02-19 DIAGNOSIS — F329 Major depressive disorder, single episode, unspecified: Secondary | ICD-10-CM | POA: Diagnosis present

## 2013-02-19 DIAGNOSIS — Z87891 Personal history of nicotine dependence: Secondary | ICD-10-CM

## 2013-02-19 DIAGNOSIS — R079 Chest pain, unspecified: Secondary | ICD-10-CM

## 2013-02-19 LAB — GLUCOSE, CAPILLARY
Glucose-Capillary: 413 mg/dL — ABNORMAL HIGH (ref 70–99)
Glucose-Capillary: 247 mg/dL — ABNORMAL HIGH (ref 70–99)

## 2013-02-19 LAB — BASIC METABOLIC PANEL
BUN: 25 mg/dL — ABNORMAL HIGH (ref 6–23)
Creatinine, Ser: 1.23 mg/dL (ref 0.50–1.35)
GFR calc Af Amer: 74 mL/min — ABNORMAL LOW (ref >90)
GFR calc non Af Amer: 64 mL/min — ABNORMAL LOW (ref >90)
Glucose, Bld: 482 mg/dL — ABNORMAL HIGH (ref 70–99)

## 2013-02-19 LAB — CBC
HCT: 37.1 % — ABNORMAL LOW (ref 39.0–52.0)
Hemoglobin: 12.8 g/dL — ABNORMAL LOW (ref 13.0–17.0)
MCHC: 34.5 g/dL (ref 30.0–36.0)
MCV: 85.5 fL (ref 78.0–100.0)
RDW: 13.6 % (ref 11.5–15.5)

## 2013-02-19 LAB — TROPONIN I: Troponin I: <0.3 ng/mL (ref <0.30)

## 2013-02-19 MED ORDER — ALLOPURINOL 150 MG HALF TABLET
150.0000 mg | ORAL_TABLET | Freq: Every day | ORAL | Status: DC
  Administered 2013-02-19 – 2013-02-20 (×2): 150 mg via ORAL
  Filled 2013-02-19 (×2): qty 1

## 2013-02-19 MED ORDER — ARIPIPRAZOLE 5 MG PO TABS
5.0000 mg | ORAL_TABLET | Freq: Every day | ORAL | Status: DC
  Administered 2013-02-19 – 2013-02-20 (×2): 5 mg via ORAL
  Filled 2013-02-19 (×2): qty 1

## 2013-02-19 MED ORDER — ASPIRIN EC 81 MG PO TBEC
81.0000 mg | DELAYED_RELEASE_TABLET | Freq: Every day | ORAL | Status: DC
  Administered 2013-02-20: 81 mg via ORAL
  Filled 2013-02-19: qty 1

## 2013-02-19 MED ORDER — SODIUM CHLORIDE 0.9 % IJ SOLN: 3.0000 mL | Freq: Two times a day (BID) | INTRAMUSCULAR | Status: DC

## 2013-02-19 MED ORDER — SODIUM CHLORIDE 0.9 % IV SOLN: INTRAVENOUS | Status: DC

## 2013-02-19 MED ORDER — ACETAMINOPHEN 650 MG RE SUPP: 650.0000 mg | Freq: Four times a day (QID) | RECTAL | Status: DC | PRN

## 2013-02-19 MED ORDER — ACETAMINOPHEN 325 MG PO TABS
650.0000 mg | ORAL_TABLET | Freq: Four times a day (QID) | ORAL | Status: DC | PRN
  Administered 2013-02-19: 650 mg via ORAL
  Filled 2013-02-19: qty 2

## 2013-02-19 MED ORDER — DEXTROSE-NACL 5-0.45 % IV SOLN
INTRAVENOUS | Status: DC
  Administered 2013-02-19: 12:00:00 via INTRAVENOUS

## 2013-02-19 MED ORDER — HEPARIN (PORCINE) IN NACL 100-0.45 UNIT/ML-% IJ SOLN
1350.0000 [IU]/h | INTRAMUSCULAR | Status: DC
  Administered 2013-02-19: 1150 [IU]/h via INTRAVENOUS
  Administered 2013-02-19: 900 [IU]/h via INTRAVENOUS
  Administered 2013-02-20: 1350 [IU]/h via INTRAVENOUS
  Filled 2013-02-19 (×3): qty 250

## 2013-02-19 MED ORDER — HEPARIN BOLUS VIA INFUSION
4000.0000 [IU] | Freq: Once | INTRAVENOUS | Status: AC
  Administered 2013-02-19: 4000 [IU] via INTRAVENOUS

## 2013-02-19 MED ORDER — INSULIN ASPART 100 UNIT/ML ~~LOC~~ SOLN
0.0000 [IU] | Freq: Three times a day (TID) | SUBCUTANEOUS | Status: DC
  Administered 2013-02-19 – 2013-02-20 (×2): 8 [IU] via SUBCUTANEOUS
  Administered 2013-02-20: 11 [IU] via SUBCUTANEOUS

## 2013-02-19 MED ORDER — SODIUM CHLORIDE 0.9 % IV SOLN
INTRAVENOUS | Status: DC
  Administered 2013-02-19: 5.9 [IU]/h via INTRAVENOUS
  Administered 2013-02-19: 3.6 [IU]/h via INTRAVENOUS
  Filled 2013-02-19: qty 1

## 2013-02-19 MED ORDER — MORPHINE SULFATE 2 MG/ML IJ SOLN: 2.0000 mg | INTRAMUSCULAR | Status: DC | PRN

## 2013-02-19 MED ORDER — SODIUM CHLORIDE 0.9 % IV SOLN
INTRAVENOUS | Status: DC
  Administered 2013-02-19: 15:00:00 via INTRAVENOUS

## 2013-02-19 MED ORDER — DEXTROSE 50 % IV SOLN: 25.0000 mL | INTRAVENOUS | Status: DC | PRN

## 2013-02-19 MED ORDER — SODIUM CHLORIDE 0.9 % IV SOLN
INTRAVENOUS | Status: DC
  Administered 2013-02-19 (×2): via INTRAVENOUS

## 2013-02-19 MED ORDER — ONDANSETRON HCL 4 MG/2ML IJ SOLN: 4.0000 mg | Freq: Four times a day (QID) | INTRAMUSCULAR | Status: DC | PRN

## 2013-02-19 MED ORDER — NITROGLYCERIN 2 % TD OINT
0.5000 [in_us] | TOPICAL_OINTMENT | Freq: Once | TRANSDERMAL | Status: AC
  Administered 2013-02-19: 0.5 [in_us] via TOPICAL
  Filled 2013-02-19: qty 1

## 2013-02-19 MED ORDER — ONDANSETRON HCL 4 MG PO TABS: 4.0000 mg | ORAL_TABLET | Freq: Four times a day (QID) | ORAL | Status: DC | PRN

## 2013-02-19 MED ORDER — HEPARIN (PORCINE) IN NACL 100-0.45 UNIT/ML-% IJ SOLN
12.0000 [IU]/kg/h | INTRAMUSCULAR | Status: DC
  Administered 2013-02-19: 12 [IU]/kg/h via INTRAVENOUS
  Filled 2013-02-19: qty 250

## 2013-02-19 MED ORDER — SODIUM CHLORIDE 0.9 % IV BOLUS (SEPSIS)
1000.0000 mL | Freq: Once | INTRAVENOUS | Status: AC
  Administered 2013-02-19: 1000 mL via INTRAVENOUS

## 2013-02-19 MED ORDER — ALUM & MAG HYDROXIDE-SIMETH 200-200-20 MG/5ML PO SUSP: 30.0000 mL | Freq: Four times a day (QID) | ORAL | Status: DC | PRN

## 2013-02-19 MED ORDER — HEPARIN BOLUS VIA INFUSION
2200.0000 [IU] | Freq: Once | INTRAVENOUS | Status: AC
  Administered 2013-02-19: 2200 [IU] via INTRAVENOUS
  Filled 2013-02-19: qty 2200

## 2013-02-19 MED ORDER — ATORVASTATIN CALCIUM 20 MG PO TABS
20.0000 mg | ORAL_TABLET | Freq: Every day | ORAL | Status: DC
  Administered 2013-02-19 – 2013-02-20 (×2): 20 mg via ORAL
  Filled 2013-02-19 (×2): qty 1

## 2013-02-19 MED ORDER — AMLODIPINE BESYLATE 5 MG PO TABS
5.0000 mg | ORAL_TABLET | Freq: Every day | ORAL | Status: DC
  Administered 2013-02-19 – 2013-02-20 (×2): 5 mg via ORAL
  Filled 2013-02-19 (×2): qty 1

## 2013-02-19 MED ORDER — INSULIN REGULAR BOLUS VIA INFUSION
0.0000 [IU] | Freq: Three times a day (TID) | INTRAVENOUS | Status: DC
  Filled 2013-02-19: qty 10

## 2013-02-19 MED ORDER — ENALAPRIL MALEATE 20 MG PO TABS
20.0000 mg | ORAL_TABLET | Freq: Every day | ORAL | Status: DC
  Administered 2013-02-19 – 2013-02-20 (×2): 20 mg via ORAL
  Filled 2013-02-19 (×2): qty 1

## 2013-02-19 MED ORDER — METOPROLOL TARTRATE 12.5 MG HALF TABLET
12.5000 mg | ORAL_TABLET | Freq: Two times a day (BID) | ORAL | Status: DC
  Administered 2013-02-19: 12.5 mg via ORAL
  Filled 2013-02-19 (×2): qty 1

## 2013-02-19 MED ORDER — NITROGLYCERIN 0.4 MG SL SUBL: 0.4000 mg | SUBLINGUAL_TABLET | SUBLINGUAL | Status: DC | PRN

## 2013-02-19 MED ORDER — SERTRALINE HCL 50 MG PO TABS
50.0000 mg | ORAL_TABLET | Freq: Every day | ORAL | Status: DC
  Administered 2013-02-19 – 2013-02-20 (×2): 50 mg via ORAL
  Filled 2013-02-19 (×2): qty 1

## 2013-02-19 MED ORDER — INSULIN ASPART 100 UNIT/ML ~~LOC~~ SOLN
0.0000 [IU] | SUBCUTANEOUS | Status: DC
Start: 2013-02-19 — End: 2013-02-19
  Administered 2013-02-19: 3 [IU] via SUBCUTANEOUS

## 2013-02-19 NOTE — ED Notes (Signed)
Assisted pt up in bed and pt given urinal.

## 2013-02-19 NOTE — ED Provider Notes (Signed)
History     CSN: [id number]  Arrival date & time 02/19/13  6962   First MD Initiated Contact with Patient 02/19/13 [id number]      Chief Complaint  Patient presents with  . Chest Pain    (Consider location/radiation/quality/duration/timing/severity/associated sxs/prior treatment) HPI Comments: Pt is a 57 y/o male with hx of untreated Htn, DM and cocaine use, last used "2-3 days ago" who presents with a complaint of recurrent left-sided chest pain. He was recently admitted for similar symptoms, was ruled out with cardiac enzymes and recommendation was made that he have a followup stress test as an outpatient and to see a cardiologist. The patient has since been evicted from his house by his wife, he has been walking for the last 36 hours around town and states that every time he walks he gets pain in the left side of his chest. He has not had his medications, he admits to some dyspnea on exertion and some left arm numbness as well. No fevers, chills, nausea, vomiting, cough, swelling, rashes or any other complaints. He has not had any medications for hypertension, diabetes and was given aspirin by EMS in route. They found his blood sugar to be 450.  Patient is a 57 y.o. male presenting with chest pain. The history is provided by the patient.  Chest Pain   Past Medical History  Diagnosis Date  . Diabetes mellitus   . Hypertension   . Gout   . MI (myocardial infarction)   . Chronic pain     right elbow  . Major depression, chronic     Past Surgical History  Procedure Laterality Date  . Coronary angioplasty with stent placement    . Knee surgery      Family History  Problem Relation Age of Onset  . Diabetic kidney disease Mother   . Hypertension Mother   . Gout Mother   . Diabetic kidney disease Father     History  Substance Use Topics  . Smoking status: Former Smoker    Quit date: 01/07/2013  . Smokeless tobacco: Never Used  . Alcohol Use: No      Review of Systems   Cardiovascular: Positive for chest pain.  All other systems reviewed and are negative.    Allergies  Fish allergy and Tomato  Home Medications   Current Outpatient Rx  Name  Route  Sig  Dispense  Refill  . allopurinol (ZYLOPRIM) 300 MG tablet   Oral   Take 0.5 tablets (150 mg total) by mouth daily.   30 tablet   0   . ARIPiprazole (ABILIFY) 5 MG tablet   Oral   Take 1 tablet (5 mg total) by mouth daily.   30 tablet   0   . aspirin EC 81 MG tablet   Oral   Take 81 mg by mouth daily.           [name] atenolol (TENORMIN) 100 MG tablet   Oral   Take 1 tablet (100 mg total) by mouth daily.   30 tablet   0   . enalapril (VASOTEC) 20 MG tablet   Oral   Take 1 tablet (20 mg total) by mouth daily.   30 tablet   1   . hydrochlorothiazide (HYDRODIURIL) 25 MG tablet   Oral   Take 1 tablet (25 mg total) by mouth daily.   30 tablet   0   . insulin glargine (LANTUS) 100 UNIT/ML injection   Subcutaneous   Inject 0.4  mLs (40 Units total) into the skin at bedtime.   10 mL   1   . metFORMIN (GLUCOPHAGE) 1000 MG tablet   Oral   Take 1 tablet (1,000 mg total) by mouth 2 (two) times daily with a meal.   30 tablet   0   . nitroGLYCERIN (NITROSTAT) 0.4 MG SL tablet   Sublingual   Place 1 tablet (0.4 mg total) under the tongue every 5 (five) minutes as needed. Chest pain x3 doses   30 tablet   0   . sertraline (ZOLOFT) 50 MG tablet   Oral   Take 1 tablet (50 mg total) by mouth daily.   30 tablet   0     BP 134/66  Pulse 95  Temp(Src) 98.3 F (36.8 C) (Oral)  Resp 20  Ht 5\' 6"  (1.676 m)  Wt 169 lb (76.658 kg)  BMI 27.29 kg/m2  SpO2 97%  Physical Exam  Nursing note and vitals reviewed. Constitutional: He appears well-developed and well-nourished. No distress.  HENT:  Head: Normocephalic and atraumatic.  Mouth/Throat: Oropharynx is clear and moist. No oropharyngeal exudate.  Eyes: Conjunctivae and EOM are normal. Pupils are equal, round, and reactive to  light. Right eye exhibits no discharge. Left eye exhibits no discharge. No scleral icterus.  Neck: Normal range of motion. Neck supple. No JVD present. No thyromegaly present.  Cardiovascular: Normal rate, regular rhythm, normal heart sounds and intact distal pulses.  Exam reveals no gallop and no friction rub.   No murmur heard. Pulmonary/Chest: Effort normal and breath sounds normal. No respiratory distress. He has no wheezes. He has no rales.  Abdominal: Soft. Bowel sounds are normal. He exhibits no distension and no mass. There is no tenderness.  Musculoskeletal: Normal range of motion. He exhibits no edema and no tenderness.  Lymphadenopathy:    He has no cervical adenopathy.  Neurological: He is alert. Coordination normal.  Skin: Skin is warm and dry. No rash noted. No erythema.  Psychiatric: He has a normal mood and affect. His behavior is normal.    ED Course  Procedures (including critical care time)  Labs Reviewed  CBC - Abnormal; Notable for the following:    Hemoglobin 12.8 (*)    HCT 37.1 (*)    All other components within normal limits  BASIC METABOLIC PANEL - Abnormal; Notable for the following:    Sodium 131 (*)    Glucose, Bld 482 (*)    BUN 25 (*)    GFR calc non Af Amer 64 (*)    GFR calc Af Amer 74 (*)    All other components within normal limits  TROPONIN I   Dg Chest Port 1 View  02/19/2013  *RADIOLOGY REPORT*  Clinical Data: Chest pain.  PORTABLE CHEST - 1 VIEW  Comparison: 02/13/2013.  Findings: Trachea is midline.  Heart size normal.  Lungs are somewhat low in volume but clear.  No pleural fluid.  IMPRESSION: No acute findings.   Original Report Authenticated By: Leanna Battles, M.D.      1. Unstable angina   2. Hyperglycemia   3. Hyponatremia       MDM  At this time the patient has ongoing left-sided chest pain, his EKG has changed in that there is no longer signs of left ventricular hypertrophy but no signs of ST elevation to suggest acute  ischemia and is essentially unchanged otherwise since April 26. Unfortunately this man is noncompliant with his medications, is hyperglycemic and has significant risk  factors for heart attack including prior myocardial infarctions from several years ago. Nitroglycerin, aspirin given prior to arrival, troponin ordered, chest x-ray ordered, patient will need to be admitted for ongoing chest pain possibly unstable angina.  ED ECG REPORT  I personally interpreted this EKG   Date: 02/19/2013   Rate:94  Rhythm: normal sinus rhythm  QRS Axis: normal  Intervals: normal  ST/T Wave abnormalities: nonspecific T wave changes  Conduction Disutrbances:none  Narrative Interpretation: q wave in lead 3  Old EKG Reviewed: unchanged other than LVH no longer present   Lab work shows that the patient is hyponatremic, he has no anion gap but appears somnolent and is significantly hyperglycemic. There is some concern for nonketotic hyperosmolar hyperglycemic cause for the somnolence. Likely his vital signs appear well, EKG with nonspecific T wave changes and a normal troponin. The patient needs provocative testing, I discussed his care with Dr. Irene Limbo of the hospitalist service who agrees that the patient would be better served at a major center with cardiac capabilities and will arrange for transfer. We have also decided together that this patient would likely benefit from heparinization with a heparin drip given his cardiac history and ongoing chest pain. Heparin ordered, transfer to [facility], step down bed.  CRITICAL CARE Performed by: Vida Roller   Total critical care time: 30  Critical care time was exclusive of separately billable procedures and treating other patients.  Critical care was necessary to treat or prevent imminent or life-threatening deterioration.  Critical care was time spent personally by me on the following activities: development of treatment plan with patient and/or surrogate as  well as nursing, discussions with consultants, evaluation of patient's response to treatment, examination of patient, obtaining history from patient or surrogate, ordering and performing treatments and interventions, ordering and review of laboratory studies, ordering and review of radiographic studies, pulse oximetry and re-evaluation of patient's condition.       Vida Roller, MD 02/19/13 [phone]

## 2013-02-19 NOTE — Plan of Care (Addendum)
Problem: Food- and Nutrition-Related Knowledge Deficit (NB-1.1) Goal: Nutrition education Formal process to instruct or train a patient/client in a skill or to impart knowledge to help patients/clients voluntarily manage or modify food choices and eating behavior to maintain or improve health.  Outcome: Completed/Met Date Met:  02/19/13  RD consulted for nutrition education regarding diabetes.     Lab Results  Component Value Date    HGBA1C 14.8* 02/13/2013    RD provided "Carbohydrate Counting for People with Diabetes" handout from the Academy of Nutrition and Dietetics. Discussed different food groups and their effects on blood sugar, emphasizing carbohydrate-containing foods. Provided list of carbohydrates and recommended serving sizes of common foods.  Discussed importance of controlled and consistent carbohydrate intake throughout the day. Provided examples of ways to balance meals/snacks and encouraged intake of high-fiber, whole grain complex carbohydrates. Teach back method used. Pt states he currently has no home and limited access to meals.  Expect poor compliance related to social circumstance.   Body mass index is 27.29 kg/(m^2). Pt meets criteria for Overweight based on current BMI.  Current diet order is Carb Mod. Labs and medications reviewed. Pt also triggered for malnutrition screening tool score >/=2, this is filed inaccurately. No further nutrition interventions warranted at this time. RD contact information provided. If additional nutrition issues arise, please re-consult RD.  Clarene Duke RD, LDN Pager [phone] After Hours pager [phone]

## 2013-02-19 NOTE — ED Notes (Signed)
Pt to department via EMS.  Pt has been without meds for HTN and diabetes several weeks.  Pt walked from [employer] to [place], and begin having pain after walk.  Pt given 325 ASA and 1 SL nitro by EMS.  Blood sugar was 450.  Pt presently showing no distress.  C/O pain on left side of chest.

## 2013-02-19 NOTE — Progress Notes (Signed)
Inpatient Diabetes Program Recommendations  AACE/ADA: New Consensus Statement on Inpatient Glycemic Control (2013)  Target Ranges:  Prepandial:   less than 140 mg/dL      Peak postprandial:   less than 180 mg/dL (1-2 hours)      Critically ill patients:  140 - 180 mg/dL    Inpatient Diabetes Program Recommendations Insulin - Basal: Recommend starting Lantus 20 units (1/2 home dose) 02/13/13 A1C=14.8 Patient reports he has had DM approx 30 years and has been on a combination of insulin and orals since diagnosis. Patient has not recently been taking insulin.  Patient reports that he does have Medicaid therefore he should be able to get his insulin.   No further questions/concerns at this visit. Thank you  Piedad Climes BSN, RN,CDE Inpatient Diabetes Coordinator [phone] (team pager)

## 2013-02-19 NOTE — Progress Notes (Signed)
ANTICOAGULATION CONSULT NOTE - Follow Up Consult  Pharmacy Consult for Heparin Indication: chest pain/ACS  Allergies  Allergen Reactions  . Fish Allergy     All seafood makes his throat swell  . Tomato     Cannot breathe    Patient Measurements: Height: 5\' 6"  (167.6 cm) Weight: 169 lb (76.658 kg) IBW/kg (Calculated) : 63.8 Heparin Dosing Weight: 76 kg  Vital Signs: Temp: 98 F (36.7 C) (05/02 1300) Temp src: Oral (05/02 1300) BP: 111/61 mmHg (05/02 1600) Pulse Rate: 80 (05/02 1600)  Labs:  Recent Labs  02/19/13 0648 02/19/13 1625  HGB 12.8*  --   HCT 37.1*  --   PLT 317  --   HEPARINUNFRC  --  0.12*  CREATININE 1.23  --   TROPONINI <0.30  --     Estimated Creatinine Clearance: 64.7 ml/min (by C-G formula based on Cr of 1.23).  Medications:  Infusions:  . sodium chloride 50 mL/hr at 02/19/13 1512  . heparin 900 Units/hr (02/19/13 1357)  . [DISCONTINUED] sodium chloride 125 mL/hr at 02/19/13 1358  . [DISCONTINUED] dextrose 5 % and 0.45% NaCl 100 mL/hr at 02/19/13 1130  . [DISCONTINUED] heparin 12 Units/kg/hr (02/19/13 0904)  . [DISCONTINUED] insulin (NOVOLIN-R) infusion    . [DISCONTINUED] insulin (NOVOLIN-R) infusion 3.7 Units/hr (02/19/13 1106)    Assessment: 57 yo male transferred from [facility] for chest pain. Heparin drip was started at [facility] around 9 AM today at 900 units/hr (~12 units/kr/hr). No bleeding or complications noted per chart notes.   Heparin level is low on 900 units/hr.  Goal of Therapy:  Heparin level 0.3-0.7 units/ml Monitor platelets by anticoagulation protocol: Yes   Plan:  -Heparin 2200 units IV bolus then increase infusion to 1150 units/hr -Heparin level 6 hours after rate change -Monitor for signs/symptoms of bleeding  [facility], [place].D., BCPS Clinical Pharmacist Pager: [phone] 02/19/2013 5:12 PM

## 2013-02-19 NOTE — ED Notes (Signed)
I called [facility] for transport to [facility], rm [id number].  They will send next available truck.  Nurse aware.

## 2013-02-19 NOTE — Progress Notes (Signed)
ANTICOAGULATION CONSULT NOTE - Initial Consult  Pharmacy Consult for Heparin Indication: chest pain/ACS  Allergies  Allergen Reactions  . Fish Allergy     All seafood makes his throat swell  . Tomato     Cannot breathe    Patient Measurements: Height: 5\' 6"  (167.6 cm) Weight: 169 lb (76.658 kg) IBW/kg (Calculated) : 63.8 Heparin Dosing Weight: 76 kg  Vital Signs: Temp: 98 F (36.7 C) (05/02 1300) Temp src: Oral (05/02 1300) BP: 111/58 mmHg (05/02 1330) Pulse Rate: 72 (05/02 1330)  Labs:  Recent Labs  02/19/13 0648  HGB 12.8*  HCT 37.1*  PLT 317  CREATININE 1.23  TROPONINI <0.30    Estimated Creatinine Clearance: 64.7 ml/min (by C-G formula based on Cr of 1.23).   Medical History: Past Medical History  Diagnosis Date  . Diabetes mellitus   . Hypertension   . Gout   . MI (myocardial infarction)   . Chronic pain     right elbow  . Major depression, chronic     Medications:  Scheduled:  . allopurinol  150 mg Oral Daily  . ARIPiprazole  5 mg Oral Daily  . [START ON 02/20/2013] aspirin EC  81 mg Oral Daily  . enalapril  20 mg Oral Daily  . [COMPLETED] heparin  4,000 Units Intravenous Once  . insulin aspart  0-15 Units Subcutaneous TID WC  . metoprolol tartrate  12.5 mg Oral BID  . [COMPLETED] nitroGLYCERIN  0.5 inch Topical Once  . sertraline  50 mg Oral Daily  . [COMPLETED] sodium chloride  1,000 mL Intravenous Once  . [DISCONTINUED] insulin aspart  0-15 Units Subcutaneous Q4H  . [DISCONTINUED] insulin regular  0-10 Units Intravenous TID WC  . [DISCONTINUED] sodium chloride  3 mL Intravenous Q12H   Scheduled Meds: . allopurinol  150 mg Oral Daily  . ARIPiprazole  5 mg Oral Daily  . [START ON 02/20/2013] aspirin EC  81 mg Oral Daily  . enalapril  20 mg Oral Daily  . [COMPLETED] heparin  4,000 Units Intravenous Once  . insulin aspart  0-15 Units Subcutaneous TID WC  . metoprolol tartrate  12.5 mg Oral BID  . [COMPLETED] nitroGLYCERIN  0.5 inch Topical  Once  . sertraline  50 mg Oral Daily  . [COMPLETED] sodium chloride  1,000 mL Intravenous Once  . [DISCONTINUED] insulin aspart  0-15 Units Subcutaneous Q4H  . [DISCONTINUED] insulin regular  0-10 Units Intravenous TID WC  . [DISCONTINUED] sodium chloride  3 mL Intravenous Q12H   Continuous Infusions: . sodium chloride    . heparin 900 Units/hr (02/19/13 1357)  . [DISCONTINUED] sodium chloride 125 mL/hr at 02/19/13 1358  . [DISCONTINUED] dextrose 5 % and 0.45% NaCl 100 mL/hr at 02/19/13 1130  . [DISCONTINUED] heparin 12 Units/kg/hr (02/19/13 0904)  . [DISCONTINUED] insulin (NOVOLIN-R) infusion    . [DISCONTINUED] insulin (NOVOLIN-R) infusion 3.7 Units/hr (02/19/13 1106)   PRN Meds:.acetaminophen, acetaminophen, alum & mag hydroxide-simeth, dextrose, morphine injection, nitroGLYCERIN, ondansetron (ZOFRAN) IV, ondansetron   Assessment: 56 yo male transferred from [facility] for chest pain.  Heparin drip was started at [facility] around 9 AM today at 900 units/hr (~12 units/kr/hr).  No bleeding or complications noted per chart notes.    Goal of Therapy:  Heparin level 0.3-0.7 units/ml Monitor platelets by anticoagulation protocol: Yes   Plan:  1. Will check heparin level at 1500 PM today.  Adjust heparin rate as needed. 2. Daily heparin level and CBC.  Reece Leader, Pharm D, BCPS  Clinical Pharmacist Pager [phone]  02/19/2013 2:47 PM

## 2013-02-19 NOTE — H&P (Signed)
History and Physical  [name] ZOX:[id number] DOB: Nov 15, 1955 DOA: 02/19/2013  Referring physician: Eber Hong, MD PCP: No PCP Per Patient   Chief Complaint: Chest pain  HPI:  57 year old man presented to the emergency department with chest pain. Initial evaluation was notable for marked hyperglycemia. EKG was nonacute and troponin is negative. Because of ongoing chest pain admission was recommended and given history as outlined below, transfer to [facility] was recommended in anticipation of further cardiac testing not available at this facility over the weekend.  History obtained from patient and chart. Patient was hospitalized 4/25-4/27/2014 for chest pain. He ruled out with serial enzymes at that time and was discharged home with recommendation to followup as an outpatient. He did not followup. He is not taking any medications since his discharge. He has no primary care physician. He reports cocaine use approximately 3 days ago. There is some disagreement in his household and he was put out of the house last night 5/1. He started walking towards [place] and developed chest pain with exertion. This was relieved with rest. He subsequently resumed walking and has been walking intermittently all night. Chest pain has been constant but waxes and wanes in intensity it is worse with relation. It is sharp and located in the left side of the chest. He has numbness in the left hand. No radiation to neck or jaw. No diaphoresis. No nausea or vomiting. He reports a history of MI 2008.  In the emergency department he was noted to be afebrile with stable vital signs. He had already received aspirin prior to arrival. EKG was nonacute. Troponin was negative. Because of ongoing chest pain concerning for unstable angina was high. He was started on heparin infusion and given nitroglycerin and resumed on beta blocker. He is also noted to be markedly hyperglycemic without ketosis and started on insulin  infusion.  Review of Systems:  Negative for fever, visual changes, sore throat, rash, new muscle aches, dysuria, bleeding, n/v/abdominal pain.  Past Medical History  Diagnosis Date  . Diabetes mellitus   . Hypertension   . Gout   . MI (myocardial infarction)   . Chronic pain     right elbow  . Major depression, chronic     Past Surgical History  Procedure Laterality Date  . Coronary angioplasty with stent placement      2008  . Knee surgery      both    Social History:  reports that he quit smoking about 6 weeks ago. He has never used smokeless tobacco. He reports that he uses illicit drugs. He reports that he does not drink alcohol.  Allergies  Allergen Reactions  . Fish Allergy     All seafood makes his throat swell  . Tomato     Cannot breathe    Family History  Problem Relation Age of Onset  . Diabetic kidney disease Mother   . Hypertension Mother   . Gout Mother   . Diabetic kidney disease Father   . Heart attack Brother 37     Prior to Admission medications   Medication Sig Start Date End Date Taking? Authorizing Provider  allopurinol (ZYLOPRIM) 300 MG tablet Take 0.5 tablets (150 mg total) by mouth daily. 02/14/13   Nimish Normajean Glasgow, MD  ARIPiprazole (ABILIFY) 5 MG tablet Take 1 tablet (5 mg total) by mouth daily. 02/14/13   Nimish Normajean Glasgow, MD  aspirin EC 81 MG tablet Take 81 mg by mouth daily.  Historical Provider, MD  atenolol (TENORMIN) 100 MG tablet Take 1 tablet (100 mg total) by mouth daily. 02/14/13   Nimish Normajean Glasgow, MD  enalapril (VASOTEC) 20 MG tablet Take 1 tablet (20 mg total) by mouth daily. 02/14/13   Nimish Normajean Glasgow, MD  hydrochlorothiazide (HYDRODIURIL) 25 MG tablet Take 1 tablet (25 mg total) by mouth daily. 02/14/13   Nimish Normajean Glasgow, MD  insulin glargine (LANTUS) 100 UNIT/ML injection Inject 0.4 mLs (40 Units total) into the skin at bedtime. 02/14/13   Nimish Normajean Glasgow, MD  metFORMIN (GLUCOPHAGE) 1000 MG tablet Take 1 tablet (1,000 mg  total) by mouth 2 (two) times daily with a meal. 02/14/13   Nimish C Karilyn Cota, MD  nitroGLYCERIN (NITROSTAT) 0.4 MG SL tablet Place 1 tablet (0.4 mg total) under the tongue every 5 (five) minutes as needed. Chest pain x3 doses 02/14/13   Nimish Normajean Glasgow, MD  sertraline (ZOLOFT) 50 MG tablet Take 1 tablet (50 mg total) by mouth daily. 02/14/13   Wilson Singer, MD   Physical Exam: Filed Vitals:   02/19/13 0638  BP: 134/66  Pulse: 95  Temp: 98.3 F (36.8 C)  TempSrc: Oral  Resp: 20  Height: 5\' 6"  (1.676 m)  Weight: 76.658 kg (169 lb)  SpO2: 97%    General:  Examined in the emergency department. Appears calm and comfortable Eyes: PERRL, normal lids, irises  ENT: grossly normal hearing, lips & tongue Neck: no LAD, masses or thyromegaly Cardiovascular: RRR, no m/r/g. No LE edema. Respiratory: CTA bilaterally, no w/r/r. Normal respiratory effort. Abdomen: soft, ntnd Skin: no rash or induration seen  Musculoskeletal: grossly normal tone BUE/BLE Psychiatric: grossly normal mood and affect, speech fluent and appropriate Neurologic: grossly non-focal.  Wt Readings from Last 3 Encounters:  02/19/13 76.658 kg (169 lb)  02/14/13 78.5 kg (173 lb 1 oz)  12/25/11 77.111 kg (170 lb)    Labs on Admission:  Basic Metabolic Panel:  Recent Labs Lab 02/13/13 0034 02/13/13 1000 02/14/13 0605 02/19/13 0648  NA 126* 131* 135 131*  K 4.3 3.7 4.3 3.6  CL 89* 100 104 96  CO2 22 21 21 24   GLUCOSE 700* 262* 370* 482*  BUN 25* 19 17 25*  CREATININE 1.06 0.95 0.92 1.23  CALCIUM 9.5 8.4 8.1* 8.6   CBC:  Recent Labs Lab 02/13/13 0034 02/14/13 0605 02/19/13 0648  WBC 6.3 7.7 7.3  HGB 12.5* 11.3* 12.8*  HCT 35.8* 32.7* 37.1*  MCV 85.6 86.3 85.5  PLT 308 266 317    Cardiac Enzymes:  Recent Labs Lab 02/13/13 0034 02/13/13 0527 02/13/13 1029 02/13/13 1717 02/19/13 0648  TROPONINI <0.30 <0.30 <0.30 <0.30 <0.30    CBG:  Recent Labs Lab 02/13/13 1853 02/13/13 2053  02/14/13 0729 02/14/13 1158 02/19/13 0824  GLUCAP 207* 221* 291* 342* 413*     Radiological Exams on Admission: Dg Chest Port 1 View  02/19/2013  *RADIOLOGY REPORT*  Clinical Data: Chest pain.  PORTABLE CHEST - 1 VIEW  Comparison: 02/13/2013.  Findings: Trachea is midline.  Heart size normal.  Lungs are somewhat low in volume but clear.  No pleural fluid.  IMPRESSION: No acute findings.   Original Report Authenticated By: Leanna Battles, M.D.     EKG: Independently reviewed. Sinus rhythm. Nonspecific T-wave abnormality. No acute changes. No significant change when compared to previous is 4/26/114   Principal Problem:   Unstable angina Active Problems:   Major depression, chronic   Hypertension   Noncompliance   CAD (  coronary artery disease)   DM (diabetes mellitus), type 2, uncontrolled   Cocaine abuse   Assessment/Plan 1. Unstable angina: Hemodynamically stable. First troponin negative. EKG without acute changes. Transfer to [facility] for cardiology consultation over the weekend. Continue aspirin, heparin, beta blocker. Serial cardiac enzymes. Suspect will need stress testing or catheterization. 2. Diabetes mellitus type 2, uncontrolled with marked hyperglycemia, nonketotic: Glucose stabilizer. Secondary to noncompliance. Consult diabetes nurse and nutrition. 3. Cocaine abuse: Check urine drug screen. Counsel cessation. 4. Hypertension: Stable. 5. Depression: Stable. 6. Noncompliance  Discussed with Dr. Hyacinth Meeker, Dr. Sharon Seller, flow manager and LB card master. Notify Biomedical scientist on arrival for cardiology consultation. Discussed with patient, he is in agreement.  Code Status: Full code Family Communication: None present Disposition Plan/Anticipated LOS: Admit to [facility]. 2-4 days.  Time spent: 60 minutes  Brendia Sacks, MD  [facility] Pager [phone] 02/19/2013, 8:39 AM

## 2013-02-19 NOTE — Consult Note (Signed)
CARDIOLOGY CONSULT NOTE  Patient ID: [name], MRN: [id number], DOB/AGE: 1956/06/13 57 y.o. Admit date: 02/19/2013 Date of Consult: 02/19/2013  Primary Physician: No PCP Per Patient Primary Cardiologist: None Referring Physician: Sharon Seller ([facility])  Chief Complaint: chest pain Reason for Consultation: cardiac evaluation of unstable angina  HPI: 57 y.o. male w/ PMHx significant for hypertension, poorly controlled insulin dependent diabetes mellitus, coronary artery disease s/p cardiac catheterization ~ 2008 (?stent), cocaine abuse and multiple admission for chest pain who presented to [facility] on 02/19/2013 after transfer from [facility] ED with complaints of left sided chest pain. Pt states that he is on parole since release from prison Jan 2014 which has created household turmoil.  States that his wife and other friend/ relative that lives at his house continue to [facility] crack cocaine.  He states that when he confronted them out of concern for a parole violation "they then put me out of the house".  He further elaborates that he began walking from his house in [place] towards  [place] where his mother lives and started to experience worsening left sided squeezing chest pain and arm numbness. Pain was sharp at times, waxed and waned, worsened when going up stairs and is intermittently associated with mild shortness of breath.  He denies diaphoresis, radiation of pain, nausea or vomiting. He states that he had 2 prior "heart attacks" while in jail in 2005 and 2008.  He has not had any medication for nearly a month with exception of when he was briefly hospitalized 4/45-4/28 at [facility] for chest pain.  Past Medical History  Diagnosis Date  . Diabetes mellitus   . Hypertension   . Gout   . MI (myocardial infarction)   . Chronic pain     right elbow  . Major depression, chronic       Surgical History:  Past Surgical History  Procedure Laterality Date  .  Coronary angioplasty with stent placement      2008  . Knee surgery      both     Home Meds: Prior to Admission medications   Medication Sig Start Date End Date Taking? Authorizing Provider  allopurinol (ZYLOPRIM) 300 MG tablet Take 0.5 tablets (150 mg total) by mouth daily. 02/14/13   Nimish Normajean Glasgow, MD  ARIPiprazole (ABILIFY) 5 MG tablet Take 1 tablet (5 mg total) by mouth daily. 02/14/13   Nimish Normajean Glasgow, MD  aspirin EC 81 MG tablet Take 81 mg by mouth daily.      Historical Provider, MD  atenolol (TENORMIN) 100 MG tablet Take 1 tablet (100 mg total) by mouth daily. 02/14/13   Nimish Normajean Glasgow, MD  enalapril (VASOTEC) 20 MG tablet Take 1 tablet (20 mg total) by mouth daily. 02/14/13   Nimish Normajean Glasgow, MD  hydrochlorothiazide (HYDRODIURIL) 25 MG tablet Take 1 tablet (25 mg total) by mouth daily. 02/14/13   Nimish Normajean Glasgow, MD  insulin glargine (LANTUS) 100 UNIT/ML injection Inject 0.4 mLs (40 Units total) into the skin at bedtime. 02/14/13   Nimish Normajean Glasgow, MD  metFORMIN (GLUCOPHAGE) 1000 MG tablet Take 1 tablet (1,000 mg total) by mouth 2 (two) times daily with a meal. 02/14/13   Nimish C Karilyn Cota, MD  nitroGLYCERIN (NITROSTAT) 0.4 MG SL tablet Place 1 tablet (0.4 mg total) under the tongue every 5 (five) minutes as needed. Chest pain x3 doses 02/14/13   Nimish Normajean Glasgow, MD  sertraline (ZOLOFT) 50 MG tablet Take 1 tablet (  50 mg total) by mouth daily. 02/14/13   Wilson Singer, MD    Inpatient Medications:  . allopurinol  150 mg Oral Daily  . ARIPiprazole  5 mg Oral Daily  . [START ON 02/20/2013] aspirin EC  81 mg Oral Daily  . enalapril  20 mg Oral Daily  . insulin aspart  0-15 Units Subcutaneous Q4H  . metoprolol tartrate  12.5 mg Oral BID  . sertraline  50 mg Oral Daily  . sodium chloride  3 mL Intravenous Q12H   . sodium chloride 125 mL/hr at 02/19/13 1358  . heparin 900 Units/hr (02/19/13 1357)    Allergies:  Allergies  Allergen Reactions  . Fish Allergy     All seafood  makes his throat swell  . Tomato     Cannot breathe    History   Social History  . Marital Status: Married    Spouse Name: N/A    Number of Children: N/A  . Years of Education: N/A   Occupational History  . Not on file.   Social History Main Topics  . Smoking status: Former Smoker    Quit date: 01/07/2013  . Smokeless tobacco: Never Used  . Alcohol Use: No  . Drug Use: Yes     Comment: cocaine  . Sexually Active: Not on file   Other Topics Concern  . Not on file   Social History Narrative  . No narrative on file     Family History  Problem Relation Age of Onset  . Diabetic kidney disease Mother   . Hypertension Mother   . Gout Mother   . Diabetic kidney disease Father   . Heart attack Brother 37     Review of Systems: General: negative for chills, fever, night sweats Cardiovascular: positive chest pain, occasional dyspnea on exertion, edema, denies orthopnea, palpitations, or paroxysmal nocturnal dyspnea Respiratory: negative for cough or wheezing GI: negative for nausea, vomiting, diarrhea, bright red blood per rectum, melena, or hematemesis GU: no hematuria, urgency, or frequency Neurologic: left arm numbness Musculoskeletal: reports some left lower leg shin pain All other systems reviewed and are otherwise negative except as noted above.  Physical Exam: Blood pressure 111/58, pulse 72, temperature 98 F (36.7 C), temperature source Oral, resp. rate 20, height 5\' 6"  (1.676 m), weight 169 lb (76.658 kg), SpO2 96.00%. General: Well developed, well nourished, AA male, alert and oriented, in no acute distress, sitting upright in bed HEENT: Normocephalic, atraumatic, sclera non-icteric  Neck: Supple. Carotids 2+ without bruits appreciated. JVP normal Chest: tender to palpation over mid sternum and outer left clavicle Lungs: Clear bilaterally to auscultation without wheezes, rales, or rhonchi appreciated.  Heart: RRR with normal S1 and S2. No murmurs, rubs, or  gallops appreciated. Abdomen: Soft, non-tender, non-distended with normoactive bowel sounds. Extremities: No clubbing, cyanosis, or edema.  Distal pedal pulses are 2+ and equal bilaterally. Neuro: CNII-XII intact, moves all extremities spontaneously, sensation to fine and gross touch intact throughout Psych:  appropriately mood and affect, cooperative through exam    Labs:  Recent Labs  02/19/13 0648  TROPONINI <0.30   Lab Results  Component Value Date   WBC 7.3 02/19/2013   HGB 12.8* 02/19/2013   HCT 37.1* 02/19/2013   MCV 85.5 02/19/2013   PLT 317 02/19/2013    Recent Labs Lab 02/14/13 0605 02/19/13 0648  NA 135 131*  K 4.3 3.6  CL 104 96  CO2 21 24  BUN 17 25*  CREATININE 0.92 1.23  CALCIUM  8.1* 8.6  PROT 6.6  --   BILITOT 0.2*  --   ALKPHOS 72  --   ALT 8  --   AST 11  --   GLUCOSE 370* 482*   No results found for this basename: CHOL, HDL, LDLCALC, TRIG   No results found for this basename: DDIMER    Radiology/Studies:  Dg Chest Port 1 View  02/19/2013  *RADIOLOGY REPORT*  Clinical Data: Chest pain.  PORTABLE CHEST - 1 VIEW  Comparison: 02/13/2013.  Findings: Trachea is midline.  Heart size normal.  Lungs are somewhat low in volume but clear.  No pleural fluid.  IMPRESSION: No acute findings.   Original Report Authenticated By: Leanna Battles, M.D.    Dg Chest Portable 1 View  02/13/2013  *RADIOLOGY REPORT*  Clinical Data: Chest pain  PORTABLE CHEST - 1 VIEW  Comparison: 12/25/2011  Findings: Cardiac leads overlie the chest.  Heart size is normal. Lung volumes are low but clear.  No pleural effusion.  No acute osseous finding.  IMPRESSION: Normal chest.   Original Report Authenticated By: Christiana Pellant, M.D.     EKG: NSR, 94 bpm, prolonged QTc, no acute ST changes  ASSESSMENT AND PLAN: 57 yo AA male with history of prior MI, medical noncompliance of hypertension and diabetes and multiple admission for chest pain evaluation presenting again with chest pain worrisome  consistent with unstable angina.  1. Unstable Angina: no evidence of acute ischemia on EKG, cardiac enzymes negative thus far, ECHO with mild LVH, normal ventricular diastolic and systolic function, EF 55-60% and dilated left atrium. CXR unremarkable. Patient with likely current cocaine abuse. -cont to cycle troponins -cont ACEi -consider holding BB in setting of likely acute cocaine use, check urine drug screen -recommend cardiac myoview withlikelcardiac catheterization    Recent Labs Lab 02/13/13 0034 02/13/13 0527 02/13/13 1029 02/13/13 1717 02/19/13 0648  TROPONINI <0.30 <0.30 <0.30 <0.30 <0.30    2. Hypertension: currently controlled, prescribed atenolol 100 mg qd, enalapril 20 mg qd, HCTZ 25 mg qd of which patient has not been taking since recent discharge 4/28  3. Diabetes Mellitus, insulin dependent: uncontrolled, HgbA1c ~15 due to poor medical compliance  CBG (last 3)   Recent Labs  02/19/13 1049 02/19/13 1157 02/19/13 1309  GLUCAP 247* 102* 155*     Signed, SCHOOLER, KAREN  02/19/2013, 2:17 PM  Patient seen with resident, agree with the above note.  Patient presented with on and off atypical chest pain.  ECG unchanged and cardiac enzymes are negative so far.  He has CAD risk factors: poorly controlled diabetes and HTN.  He has had prior cardiac catheterizations but I am unclear as to the results (not done here).    - Cycle cardiac enzymes to rule out MI - If rules out, Lexiscan Myoview tomorrow morning (NPO at midnight).   - Given risk factors, ought to be on a statin so will start atorvastatin 20 mg daily.  - Would avoid atenolol given concern for cocaine use (awaiting drug screen), can use amlodipine 5 mg daily instead.   Marca Ancona 02/19/2013 3:37 PM

## 2013-02-20 ENCOUNTER — Inpatient Hospital Stay (HOSPITAL_COMMUNITY): Payer: Medicaid Other

## 2013-02-20 DIAGNOSIS — R079 Chest pain, unspecified: Secondary | ICD-10-CM

## 2013-02-20 DIAGNOSIS — I200 Unstable angina: Secondary | ICD-10-CM

## 2013-02-20 LAB — GLUCOSE, CAPILLARY: Glucose-Capillary: 346 mg/dL — ABNORMAL HIGH (ref 70–99)

## 2013-02-20 LAB — CBC
Hemoglobin: 10.6 g/dL — ABNORMAL LOW (ref 13.0–17.0)
MCH: 28.8 pg (ref 26.0–34.0)
MCV: 85.6 fL (ref 78.0–100.0)
RBC: 3.68 MIL/uL — ABNORMAL LOW (ref 4.22–5.81)

## 2013-02-20 LAB — RAPID URINE DRUG SCREEN, HOSP PERFORMED
Barbiturates: NOT DETECTED
Cocaine: POSITIVE — AB

## 2013-02-20 LAB — BASIC METABOLIC PANEL
CO2: 23 mEq/L (ref 19–32)
Glucose, Bld: 270 mg/dL — ABNORMAL HIGH (ref 70–99)
Potassium: 3.9 mEq/L (ref 3.5–5.1)
Sodium: 137 mEq/L (ref 135–145)

## 2013-02-20 LAB — TROPONIN I: Troponin I: <0.3 ng/mL (ref <0.30)

## 2013-02-20 MED ORDER — INSULIN GLARGINE 100 UNIT/ML ~~LOC~~ SOLN
40.0000 [IU] | Freq: Every day | SUBCUTANEOUS | Status: DC
  Administered 2013-02-20: 40 [IU] via SUBCUTANEOUS
  Filled 2013-02-20: qty 0.4

## 2013-02-20 MED ORDER — REGADENOSON 0.4 MG/5ML IV SOLN
INTRAVENOUS | Status: AC
  Administered 2013-02-20: 0.4 mg
  Filled 2013-02-20: qty 5

## 2013-02-20 MED ORDER — HYDROCHLOROTHIAZIDE 25 MG PO TABS: 25.0000 mg | ORAL_TABLET | Freq: Every day | ORAL | Status: DC

## 2013-02-20 MED ORDER — ATENOLOL 100 MG PO TABS: 100.0000 mg | ORAL_TABLET | Freq: Every day | ORAL | Status: DC

## 2013-02-20 MED ORDER — TECHNETIUM TC 99M SESTAMIBI GENERIC - CARDIOLITE
30.0000 | Freq: Once | INTRAVENOUS | Status: AC | PRN
  Administered 2013-02-20: 30 via INTRAVENOUS

## 2013-02-20 MED ORDER — TECHNETIUM TC 99M SESTAMIBI GENERIC - CARDIOLITE
10.0000 | Freq: Once | INTRAVENOUS | Status: AC | PRN
  Administered 2013-02-20: 10 via INTRAVENOUS

## 2013-02-20 MED ORDER — HYDROCHLOROTHIAZIDE 25 MG PO TABS
25.0000 mg | ORAL_TABLET | Freq: Every day | ORAL | Status: DC
  Administered 2013-02-20: 25 mg via ORAL
  Filled 2013-02-20: qty 1

## 2013-02-20 MED ORDER — HEPARIN BOLUS VIA INFUSION
2000.0000 [IU] | Freq: Once | INTRAVENOUS | Status: AC
  Administered 2013-02-20: 2000 [IU] via INTRAVENOUS
  Filled 2013-02-20: qty 2000

## 2013-02-20 MED ORDER — SERTRALINE HCL 50 MG PO TABS: 50.0000 mg | ORAL_TABLET | Freq: Every day | ORAL | Status: DC

## 2013-02-20 MED ORDER — NITROGLYCERIN 0.4 MG SL SUBL: 0.4000 mg | SUBLINGUAL_TABLET | SUBLINGUAL | Status: DC | PRN

## 2013-02-20 MED ORDER — METFORMIN HCL 500 MG PO TABS
1000.0000 mg | ORAL_TABLET | Freq: Two times a day (BID) | ORAL | Status: DC
  Administered 2013-02-20: 1000 mg via ORAL
  Filled 2013-02-20 (×2): qty 2

## 2013-02-20 MED ORDER — AMLODIPINE BESYLATE 10 MG PO TABS
10.0000 mg | ORAL_TABLET | Freq: Every day | ORAL | Status: DC
  Administered 2013-02-20: 10 mg via ORAL
  Filled 2013-02-20: qty 1

## 2013-02-20 MED ORDER — AMLODIPINE BESYLATE 10 MG PO TABS: 10.0000 mg | ORAL_TABLET | Freq: Every day | ORAL | Status: DC

## 2013-02-20 MED ORDER — ATORVASTATIN CALCIUM 20 MG PO TABS: 20.0000 mg | ORAL_TABLET | Freq: Every day | ORAL | Status: DC

## 2013-02-20 MED ORDER — ALLOPURINOL 300 MG PO TABS: 150.0000 mg | ORAL_TABLET | Freq: Every day | ORAL | Status: DC

## 2013-02-20 MED ORDER — ARIPIPRAZOLE 5 MG PO TABS: 5.0000 mg | ORAL_TABLET | Freq: Every day | ORAL | Status: DC

## 2013-02-20 MED ORDER — REGADENOSON 0.4 MG/5ML IV SOLN
0.4000 mg | Freq: Once | INTRAVENOUS | Status: DC
  Filled 2013-02-20: qty 5

## 2013-02-20 MED ORDER — METFORMIN HCL 1000 MG PO TABS: 1000.0000 mg | ORAL_TABLET | Freq: Two times a day (BID) | ORAL | Status: DC

## 2013-02-20 NOTE — Progress Notes (Signed)
Awaiting case Mgt and Social Worker consult resolutions. Per MD ok for PT to be without tele or IV acces and to shower before leaving. CMT and Elink advised. Charge RN aware. Will continue to monitor and advise attending as needed.

## 2013-02-20 NOTE — Clinical Social Work Psychosocial (Signed)
Clinical Social Work Department BRIEF PSYCHOSOCIAL ASSESSMENT 02/20/2013  Patient:  [name], [name]     Account Number:  [id number]     Admit date:  02/19/2013  Clinical Social Worker:  Oswaldo Done  Date/Time:  02/20/2013 03:40 PM  Referred by:  Physician  Date Referred:  02/20/2013 Referred for  Substance Abuse  Homelessness  Transportation assistance   Other Referral:   Interview type:   Other interview type:    PSYCHOSOCIAL DATA Living Status:  WIFE Admitted from facility:   Level of care:   Primary support name:  [name] Primary support relationship to patient:  SPOUSE Degree of support available:   Limited    CURRENT CONCERNS Current Concerns  Other - See comment   Other Concerns:    SOCIAL WORK ASSESSMENT / PLAN CSW met with patient at bedside. Patient states that he was recently released from prison after 35 years and is currently on parole. States that he lives with his wife. Denies using any alcohol or drugs. States that he and his wife live with her sons and other family members. Patient states that the sons were using cocaine and denies that he has any addiction to substances. Patient declined information on substance abuse treatment centers and did not want information on homeless shelters in [place]. Patient states that if he does not return home, he will be in violation of his parole. Patient states that his wife, sister or mother cannot come to the hospital to talke him home. Patient went to [facility] and did not want to be transferred to [facility] due to concerns about transport home. however, he states staff at [facility] reassured him we would get him home when discharge.  CSW will assist in arranging transportation for the patient home via ambulance upon dsicharge.   Assessment/plan status:   Other assessment/ plan:   Information/referral to community resources:   Several resources declined.  PTAR    PATIENT'S/FAMILY'S RESPONSE  TO PLAN OF CARE: Patiented thanked CSW for assisting with cooridnating transportation back home.       Ricke Hey, [place] [phone] (weekend)

## 2013-02-20 NOTE — Progress Notes (Signed)
Nitroglycerin patch removed per nuclear medicine protocol for lexiscan stress test.

## 2013-02-20 NOTE — Progress Notes (Signed)
ANTICOAGULATION CONSULT NOTE  Pharmacy Consult for Heparin Indication: chest pain/ACS  Allergies  Allergen Reactions  . Fish Allergy     All seafood makes his throat swell  . Tomato     Cannot breathe    Patient Measurements: Height: 5\' 6"  (167.6 cm) Weight: 169 lb (76.658 kg) IBW/kg (Calculated) : 63.8 Heparin Dosing Weight: 76 kg  Vital Signs: Temp: 98.5 F (36.9 C) (05/02 2025) Temp src: Oral (05/02 2025) BP: 126/65 mmHg (05/02 2305) Pulse Rate: 80 (05/02 2025)  Labs:  Recent Labs  02/19/13 0648 02/19/13 1625 02/19/13 2038 02/20/13 0013  HGB 12.8*  --   --   --   HCT 37.1*  --   --   --   PLT 317  --   --   --   HEPARINUNFRC  --  0.12*  --  0.22*  CREATININE 1.23  --   --   --   TROPONINI <0.30 <0.30 <0.30  --     Estimated Creatinine Clearance: 64.7 ml/min (by C-G formula based on Cr of 1.23).  Assessment: 57 yo male with chest pain for heparin  Goal of Therapy:  Heparin level 0.3-0.7 units/ml Monitor platelets by anticoagulation protocol: Yes   Plan:  -Heparin 2000 units IV bolus, then increase heparin 1350 units/hr Follow-up am labs.  Geannie Risen, PharmD, BCPS   02/20/2013 1:09 AM

## 2013-02-20 NOTE — Progress Notes (Signed)
SUBJECTIVE:  Still with some sharp chest pain.  Pain is reproducible with palpation.  He complains mostly of gouty left foot pain   PHYSICAL EXAM Filed Vitals:   02/19/13 2025 02/19/13 2305 02/20/13 0515 02/20/13 0723  BP: 117/60 126/65 137/69   Pulse: 80     Temp: 98.5 F (36.9 C) 98.2 F (36.8 C) 98.3 F (36.8 C) 98.2 F (36.8 C)  TempSrc: Oral Oral Oral Oral  Resp: 19 21  20   Height:      Weight:      SpO2: 99% 98% 96% 98%   General:  No distress Lungs:  Clear Heart:  RRR Abdomen:  Positive bowel sounds, no rebound no guarding Extremities:  Left foot swelling  LABS: Lab Results  Component Value Date   TROPONINI <0.30 02/20/2013   Results for orders placed during the hospital encounter of 02/19/13 (from the past 24 hour(s))  GLUCOSE, CAPILLARY     Status: Abnormal   Collection Time    02/19/13  8:24 AM      Result Value Range   Glucose-Capillary 413 (*) 70 - 99 mg/dL   Comment 1 Notify RN    GLUCOSE, CAPILLARY     Status: Abnormal   Collection Time    02/19/13  9:50 AM      Result Value Range   Glucose-Capillary 357 (*) 70 - 99 mg/dL  GLUCOSE, CAPILLARY     Status: Abnormal   Collection Time    02/19/13 10:49 AM      Result Value Range   Glucose-Capillary 247 (*) 70 - 99 mg/dL   Comment 1 Notify RN    GLUCOSE, CAPILLARY     Status: Abnormal   Collection Time    02/19/13 11:57 AM      Result Value Range   Glucose-Capillary 102 (*) 70 - 99 mg/dL  GLUCOSE, CAPILLARY     Status: Abnormal   Collection Time    02/19/13  1:09 PM      Result Value Range   Glucose-Capillary 155 (*) 70 - 99 mg/dL  TROPONIN I     Status: None   Collection Time    02/19/13  4:25 PM      Result Value Range   Troponin I <0.30  <0.30 ng/mL  HEPARIN LEVEL (UNFRACTIONATED)     Status: Abnormal   Collection Time    02/19/13  4:25 PM      Result Value Range   Heparin Unfractionated 0.12 (*) 0.30 - 0.70 IU/mL  GLUCOSE, CAPILLARY     Status: Abnormal   Collection Time    02/19/13   4:30 PM      Result Value Range   Glucose-Capillary 289 (*) 70 - 99 mg/dL  TROPONIN I     Status: None   Collection Time    02/19/13  8:38 PM      Result Value Range   Troponin I <0.30  <0.30 ng/mL  GLUCOSE, CAPILLARY     Status: Abnormal   Collection Time    02/19/13 11:16 PM      Result Value Range   Glucose-Capillary 252 (*) 70 - 99 mg/dL  TROPONIN I     Status: None   Collection Time    02/20/13 12:13 AM      Result Value Range   Troponin I <0.30  <0.30 ng/mL  HEPARIN LEVEL (UNFRACTIONATED)     Status: Abnormal   Collection Time    02/20/13 12:13 AM  Result Value Range   Heparin Unfractionated 0.22 (*) 0.30 - 0.70 IU/mL    Intake/Output Summary (Last 24 hours) at 02/20/13 0735 Last data filed at 02/20/13 0700  Gross per 24 hour  Intake 2260.9 ml  Output      2 ml  Net 2258.9 ml   EKG:  NSR rate 76, no acute ST T wave changes.  02/20/2013  ASSESSMENT AND PLAN:  CHEST PAIN:  Enzymes negative.  OK for [employer].  If negative no further cardiac work up.   HTN:  BP OK on current meds.    GOUT:  Per primary team.   Rollene Rotunda 02/20/2013 7:35 AM

## 2013-02-20 NOTE — Care Management (Signed)
Cm spoke with patient at bedside along with Cm Magnus Ivan concerning Cm consult for PCP assistance. Pt provided with list of Medicaid PCP providers & [facility]. Pt states recently released from prison, unsure where to receive rx upon discharge. Cm informed pt any pharmacy will accept insurance payor. Pt informed of Medicaid 3$ copay requirement in which pt states able to afford. CSW to assist pt with tx arrangements to [facility].   Roxy Manns Yolunda Kloos,RN,BSN [phone]

## 2013-02-20 NOTE — Progress Notes (Signed)
Social and case mgt consults resulted. Pt education complete. Awaiting transport per social consult resolution. Will continue to monitor and advise attending as needed.

## 2013-02-20 NOTE — Discharge Summary (Signed)
DISCHARGE SUMMARY  TRONG GOSLING  MR#: [id number]  DOB:[birth year]  Date of Admission: 02/19/2013 Date of Discharge: 02/20/2013  Attending Physician:Chapman Matteucci T  Patient's PCP:No PCP Per Patient  Consults: [facility] Cards  Disposition: D/C home   Follow-up Appts:     Follow-up Information   Follow up with IDENTIFY A PRIMARY CARE DOCTOR FOR YOURSELF AS DIRECTED BY THE CASE MANAGER TO ESTABLISH ONGOING CARE - THIS IS VERY IMPORTANT .     Discharge Diagnoses:   Initial presentation: 57 year old man presented to the emergency department with chest pain. Initial evaluation was notable for marked hyperglycemia. EKG was nonacute and troponin was negative. Because of ongoing chest pain admission was recommended and given history as outlined below, transfer to [facility] was recommended in anticipation of further cardiac testing not available at [facility] over the weekend.  History obtained from patient and chart. Patient was hospitalized 4/25-4/27/2014 for chest pain. He ruled out with serial enzymes at that time and was discharged home with recommendation to followup as an outpatient. He did not followup. He was not taking any medications since his discharge. He has no primary care physician. He reports cocaine use approximately 3 days prior to admit. There was some disagreement in his household and he was put out of the house 5/1. He started walking towards [place] and developed chest pain with exertion. This was relieved with rest. He subsequently resumed walking and had been walking intermittently all night. Chest pain had been constant but waxed and waned in intensity. It was sharp and located in the left side of the chest. He had numbness in the left hand. No radiation to neck or jaw. No diaphoresis. No nausea or vomiting. He reported a history of MI in 2008.   In the emergency department he was noted to be afebrile with stable vital signs. He had already received aspirin prior to arrival.  EKG was nonacute. Troponin was negative. Because of ongoing chest pain concern for unstable angina was high. He was started on heparin infusion and given nitroglycerin and resumed on beta blocker. He was also noted to be markedly hyperglycemic without ketosis and started on insulin infusion.   Hospital Course:  Unstable angina? - atypical chest pain Remained hemodynamically stable - ruled out with serial enzymes - EKG without acute changes - Transfered to [facility] for cardiology consultation - treated medically - NM stress test completed with no worrisome findings - pt cleared for d/c - counseled on connection between cocaine use and recurring CP as well as risk for sudden death   Diabetes mellitus type 2, uncontrolled with marked hyperglycemia, nonketotic Glucose stabilizer utilized initially - Secondary to noncompliance - pt educated on need for strict compliance with both meds and diet   Cocaine abuse UDS confirmed - Counseled cessation  Hypertension Stable during hospital stay   Depression Stable  Noncompliance Counseled on need for strict compliance     Medication List    STOP taking these medications       atenolol 100 MG tablet  Commonly known as:  TENORMIN      TAKE these medications       allopurinol 300 MG tablet  Commonly known as:  ZYLOPRIM  Take 0.5 tablets (150 mg total) by mouth daily.     amLODipine 10 MG tablet  Commonly known as:  NORVASC  Take 1 tablet (10 mg total) by mouth daily.     ARIPiprazole 5 MG tablet  Commonly known as:  ABILIFY  Take 1 tablet (  5 mg total) by mouth daily.     aspirin EC 81 MG tablet  Take 81 mg by mouth daily.     atorvastatin 20 MG tablet  Commonly known as:  LIPITOR  Take 1 tablet (20 mg total) by mouth daily at 6 PM.     enalapril 20 MG tablet  Commonly known as:  VASOTEC  Take 1 tablet (20 mg total) by mouth daily.     hydrochlorothiazide 25 MG tablet  Commonly known as:  HYDRODIURIL  Take 1 tablet (25 mg  total) by mouth daily.     insulin glargine 100 UNIT/ML injection  Commonly known as:  LANTUS  Inject 0.4 mLs (40 Units total) into the skin at bedtime.     metFORMIN 1000 MG tablet  Commonly known as:  GLUCOPHAGE  Take 1 tablet (1,000 mg total) by mouth 2 (two) times daily with a meal.     nitroGLYCERIN 0.4 MG SL tablet  Commonly known as:  NITROSTAT  Place 1 tablet (0.4 mg total) under the tongue every 5 (five) minutes as needed. Chest pain x3 doses     sertraline 50 MG tablet  Commonly known as:  ZOLOFT  Take 1 tablet (50 mg total) by mouth daily.       Day of Discharge BP 140/66  Pulse 80  Temp(Src) 97.6 F (36.4 C) (Oral)  Resp 18  Ht 5\' 6"  (1.676 m)  Wt 76.658 kg (169 lb)  BMI 27.29 kg/m2  SpO2 98%  Physical Exam: General: No acute respiratory distress Lungs: Clear to auscultation bilaterally without wheezes or crackles Cardiovascular: Regular rate and rhythm without murmur gallop or rub normal S1 and S2 Abdomen: Nontender, nondistended, soft, bowel sounds positive, no rebound, no ascites, no appreciable mass Extremities: No significant cyanosis, clubbing, or edema bilateral lower extremities  BASIC METABOLIC PANEL     Status: Abnormal   Collection Time    02/20/13  7:28 AM      Result Value Range   Sodium 137  135 - 145 mEq/L   Potassium 3.9  3.5 - 5.1 mEq/L   Chloride 106  96 - 112 mEq/L   CO2 23  19 - 32 mEq/L   Glucose, Bld 270 (*) 70 - 99 mg/dL   BUN 13  6 - 23 mg/dL   Creatinine, Ser 1.30  0.50 - 1.35 mg/dL   Calcium 8.0 (*) 8.4 - 10.5 mg/dL   GFR calc non Af Amer >90  >90 mL/min   GFR calc Af Amer >90  >90 mL/min  CBC     Status: Abnormal   Collection Time    02/20/13  7:28 AM      Result Value Range   WBC 7.1  4.0 - 10.5 K/uL   RBC 3.68 (*) 4.22 - 5.81 MIL/uL   Hemoglobin 10.6 (*) 13.0 - 17.0 g/dL   HCT 86.5 (*) 78.4 - 69.6 %   MCV 85.6  78.0 - 100.0 fL   MCH 28.8  26.0 - 34.0 pg   MCHC 33.7  30.0 - 36.0 g/dL   RDW 29.5  28.4 - 13.2 %    Platelets 269  150 - 400 K/uL  URINE RAPID DRUG SCREEN (HOSP PERFORMED)     Status: Abnormal   Collection Time    02/20/13  1:22 PM      Result Value Range   Opiates NONE DETECTED  NONE DETECTED   Cocaine POSITIVE (*) NONE DETECTED   Benzodiazepines NONE DETECTED  NONE DETECTED  Amphetamines NONE DETECTED  NONE DETECTED   Tetrahydrocannabinol NONE DETECTED  NONE DETECTED   Barbiturates NONE DETECTED  NONE DETECTED    Time spent in discharge (includes decision making & examination of pt): 30 minutes  02/20/2013, 3:32 PM   Lonia Blood, MD [facility] Office  [phone] Pager [phone]  On-Call/Text Page:      [email or web address]      password [facility]

## 2013-02-20 NOTE — Progress Notes (Signed)
Report from Night RN. Chart reviewed together. Handoff complete.  

## 2013-02-25 NOTE — ED Provider Notes (Signed)
Medical screening examination/treatment/procedure(s) were conducted as a shared visit with non-physician practitioner(s) and myself.  I personally evaluated the patient during the encounter  Nicoletta Dress. Colon Branch, MD 02/25/13 [phone]

## 2013-03-26 NOTE — Plan of Care (Cosign Needed)
Received notification from our office staff that this patient called in reporting he had never been given a prescription for his Lantus insulin. Discharge summary reviewed and based on this documentation it appears the patient already had Lantus available at time of discharge. It is important to note that this patient was discharged over one month prior on 02/20/2013. It is also noted that the patient had been informed to identify primary care doctor as directed by the case manager and to establish ongoing care. It is suspected that the patient has never established with a primary care physician and therefore has been unable to obtain refills for his much-needed medications. [facility] [place] at [phone] but the patient had yet to establish an account there or have any prescriptions filled. I was informed his previous account was in [facility] [phone]. I spoke with the pharmacist at the [facility] location and reviewed the patient's prescriptions. Patient has never had a Lantus prescription there and the metformin and hydrochlorothiazide prescriptions that were active there had never been picked up. He last filled his Zoloft and Abilify in April. I reviewed all the medications that the patient had been instructed to take at time of last discharge with this pharmacist. An updated list of current medications were called in with appropriate doses based on the discharge summary. The pharmacist was informed to notify the patient that no further refills would be approved by our office since it is of utmost importance he establish with a primary care physician for ongoing medical care. I did notify our office staff of the above. Was informed that the patient was actually wishing to have all of his prescriptions filled at the [facility] pharmacy since he is now residing with his sister in that area. The number to the [facility] location was provided to our office to give to the patient and  his family so they could call and have the prescriptions moved from the [facility] location to the reason for location.   Junious Silk, ANP for Dr. Sharon Seller

## 2013-04-21 ENCOUNTER — Emergency Department (HOSPITAL_COMMUNITY): Payer: Medicaid Other

## 2013-04-21 ENCOUNTER — Encounter (HOSPITAL_COMMUNITY): Payer: Self-pay

## 2013-04-21 ENCOUNTER — Emergency Department (HOSPITAL_COMMUNITY)
Admission: EM | Admit: 2013-04-21 | Discharge: 2013-04-21 | Disposition: A | Discharge status: Home / Self Care | Payer: Medicaid Other | Attending: Emergency Medicine | Admitting: Emergency Medicine

## 2013-04-21 DIAGNOSIS — Z9861 Coronary angioplasty status: Secondary | ICD-10-CM | POA: Insufficient documentation

## 2013-04-21 DIAGNOSIS — G8929 Other chronic pain: Secondary | ICD-10-CM | POA: Insufficient documentation

## 2013-04-21 DIAGNOSIS — Z79899 Other long term (current) drug therapy: Secondary | ICD-10-CM | POA: Insufficient documentation

## 2013-04-21 DIAGNOSIS — E1169 Type 2 diabetes mellitus with other specified complication: Secondary | ICD-10-CM | POA: Insufficient documentation

## 2013-04-21 DIAGNOSIS — Z7982 Long term (current) use of aspirin: Secondary | ICD-10-CM | POA: Insufficient documentation

## 2013-04-21 DIAGNOSIS — F339 Major depressive disorder, recurrent, unspecified: Secondary | ICD-10-CM | POA: Insufficient documentation

## 2013-04-21 DIAGNOSIS — Z794 Long term (current) use of insulin: Secondary | ICD-10-CM | POA: Insufficient documentation

## 2013-04-21 DIAGNOSIS — R079 Chest pain, unspecified: Secondary | ICD-10-CM | POA: Insufficient documentation

## 2013-04-21 DIAGNOSIS — I10 Essential (primary) hypertension: Secondary | ICD-10-CM | POA: Insufficient documentation

## 2013-04-21 DIAGNOSIS — I252 Old myocardial infarction: Secondary | ICD-10-CM | POA: Insufficient documentation

## 2013-04-21 DIAGNOSIS — M109 Gout, unspecified: Secondary | ICD-10-CM | POA: Insufficient documentation

## 2013-04-21 DIAGNOSIS — R739 Hyperglycemia, unspecified: Secondary | ICD-10-CM

## 2013-04-21 DIAGNOSIS — Z87891 Personal history of nicotine dependence: Secondary | ICD-10-CM | POA: Insufficient documentation

## 2013-04-21 LAB — CBC
HCT: 38.6 % — ABNORMAL LOW (ref 39.0–52.0)
Hemoglobin: 13.1 g/dL (ref 13.0–17.0)
MCH: 28.4 pg (ref 26.0–34.0)
MCV: 83.7 fL (ref 78.0–100.0)
RBC: 4.61 MIL/uL (ref 4.22–5.81)

## 2013-04-21 LAB — POCT I-STAT, CHEM 8
BUN: 15 mg/dL (ref 6–23)
Calcium, Ion: 1.14 mmol/L (ref 1.12–1.23)
Creatinine, Ser: 1 mg/dL (ref 0.50–1.35)
Glucose, Bld: 384 mg/dL — ABNORMAL HIGH (ref 70–99)
Sodium: 138 mEq/L (ref 135–145)
TCO2: 21 mmol/L (ref 0–100)

## 2013-04-21 LAB — POCT I-STAT TROPONIN I

## 2013-04-21 MED ORDER — INSULIN ASPART 100 UNIT/ML ~~LOC~~ SOLN
4.0000 [IU] | Freq: Once | SUBCUTANEOUS | Status: AC
  Administered 2013-04-21: 4 [IU] via SUBCUTANEOUS

## 2013-04-21 MED ORDER — INSULIN ASPART 100 UNIT/ML ~~LOC~~ SOLN: 1.0000 [IU] | Freq: Three times a day (TID) | SUBCUTANEOUS | Status: DC

## 2013-04-21 MED ORDER — SODIUM CHLORIDE 0.9 % IV BOLUS (SEPSIS)
1000.0000 mL | Freq: Once | INTRAVENOUS | Status: AC
  Administered 2013-04-21: 1000 mL via INTRAVENOUS

## 2013-04-21 MED ORDER — INSULIN ASPART 100 UNIT/ML ~~LOC~~ SOLN
8.0000 [IU] | Freq: Once | SUBCUTANEOUS | Status: DC
  Filled 2013-04-21: qty 1

## 2013-04-21 MED ORDER — POTASSIUM CHLORIDE CRYS ER 20 MEQ PO TBCR
40.0000 meq | EXTENDED_RELEASE_TABLET | Freq: Once | ORAL | Status: AC
  Administered 2013-04-21: 40 meq via ORAL
  Filled 2013-04-21: qty 2
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
## 2013-04-21 NOTE — ED Provider Notes (Signed)
History    CSN: [id number] Arrival date & time 04/21/13  0449  None    Chief Complaint  Patient presents with  . Chest Pain  . Hyperglycemia   (Consider location/radiation/quality/duration/timing/severity/associated sxs/prior Treatment) HPI History provided by patient. Brought in by EMS for complaint of chest pain and hyperglycemia. Arrival to the emergency department, patient states he doesn't really have chest pain, that he is out of his insulin, and does not have a place to stay. Is requesting something to eat and insulin for his hyperglycemia. He denies any difficulty breathing, abdominal pain, vomiting. He was recently released from prison, is unemployed, and states that it would be easier to just go back to jail. He has a history of depression but denies any suicidal or homicidal ideation.  Past Medical History  Diagnosis Date  . Diabetes mellitus   . Hypertension   . Gout   . MI (myocardial infarction)   . Chronic pain     right elbow  . Major depression, chronic    Past Surgical History  Procedure Laterality Date  . Coronary angioplasty with stent placement      2008  . Knee surgery      both   Family History  Problem Relation Age of Onset  . Diabetic kidney disease Mother   . Hypertension Mother   . Gout Mother   . Diabetic kidney disease Father   . Heart attack Brother 37   History  Substance Use Topics  . Smoking status: Former Smoker    Quit date: 01/07/2013  . Smokeless tobacco: Never Used  . Alcohol Use: No    Review of Systems  Constitutional: Negative for fever and chills.  HENT: Negative for neck pain and neck stiffness.   Eyes: Negative for pain.  Respiratory: Negative for shortness of breath.   Cardiovascular: Negative for palpitations and leg swelling.  Gastrointestinal: Negative for abdominal pain.  Genitourinary: Negative for dysuria.  Musculoskeletal: Negative for back pain.  Skin: Negative for rash.  Neurological: Negative for  headaches.  All other systems reviewed and are negative.    Allergies  Fish allergy and Tomato  Home Medications   Current Outpatient Rx  Name  Route  Sig  Dispense  Refill  . allopurinol (ZYLOPRIM) 300 MG tablet   Oral   Take 0.5 tablets (150 mg total) by mouth daily.   30 tablet   1   . amLODipine (NORVASC) 10 MG tablet   Oral   Take 1 tablet (10 mg total) by mouth daily.   30 tablet   1   . ARIPiprazole (ABILIFY) 5 MG tablet   Oral   Take 1 tablet (5 mg total) by mouth daily.   30 tablet   1   . aspirin EC 81 MG tablet   Oral   Take 81 mg by mouth daily.           [name] atorvastatin (LIPITOR) 20 MG tablet   Oral   Take 1 tablet (20 mg total) by mouth daily at 6 PM.   30 tablet   1   . enalapril (VASOTEC) 20 MG tablet   Oral   Take 1 tablet (20 mg total) by mouth daily.   30 tablet   1   . hydrochlorothiazide (HYDRODIURIL) 25 MG tablet   Oral   Take 1 tablet (25 mg total) by mouth daily.   30 tablet   1   . insulin glargine (LANTUS) 100 UNIT/ML injection   Subcutaneous  Inject 0.4 mLs (40 Units total) into the skin at bedtime.   10 mL   1   . metFORMIN (GLUCOPHAGE) 1000 MG tablet   Oral   Take 1 tablet (1,000 mg total) by mouth 2 (two) times daily with a meal.   30 tablet   1   . nitroGLYCERIN (NITROSTAT) 0.4 MG SL tablet   Sublingual   Place 1 tablet (0.4 mg total) under the tongue every 5 (five) minutes as needed. Chest pain x3 doses   30 tablet   1   . sertraline (ZOLOFT) 50 MG tablet   Oral   Take 1 tablet (50 mg total) by mouth daily.   30 tablet   1    BP 124/77  Temp(Src) 98.2 F (36.8 C) (Oral)  Resp 20  SpO2 98% Physical Exam  Constitutional: He is oriented to person, place, and time. He appears well-developed and well-nourished.  HENT:  Head: Normocephalic and atraumatic.  Eyes: EOM are normal. Pupils are equal, round, and reactive to light.  Neck: Neck supple.  Cardiovascular: Normal rate, regular rhythm and intact  distal pulses.   Pulmonary/Chest: Effort normal and breath sounds normal. No respiratory distress.  Abdominal: Soft. He exhibits no distension. There is no tenderness.  Musculoskeletal: Normal range of motion. He exhibits no edema.  Neurological: He is alert and oriented to person, place, and time. No cranial nerve deficit.  Skin: Skin is warm and dry.    ED Course  Procedures (including critical care time)   Results for orders placed during the hospital encounter of 04/21/13  CBC      Result Value Range   WBC 6.3  4.0 - 10.5 K/uL   RBC 4.61  4.22 - 5.81 MIL/uL   Hemoglobin 13.1  13.0 - 17.0 g/dL   HCT 13.0 (*) 86.5 - 78.4 %   MCV 83.7  78.0 - 100.0 fL   MCH 28.4  26.0 - 34.0 pg   MCHC 33.9  30.0 - 36.0 g/dL   RDW 69.6  29.5 - 28.4 %   Platelets 259  150 - 400 K/uL  GLUCOSE, CAPILLARY      Result Value Range   Glucose-Capillary 261 (*) 70 - 99 mg/dL  POCT I-STAT, CHEM 8      Result Value Range   Sodium 138  135 - 145 mEq/L   Potassium 3.2 (*) 3.5 - 5.1 mEq/L   Chloride 103  96 - 112 mEq/L   BUN 15  6 - 23 mg/dL   Creatinine, Ser 1.32  0.50 - 1.35 mg/dL   Glucose, Bld 440 (*) 70 - 99 mg/dL   Calcium, Ion 1.02  7.25 - 1.23 mmol/L   TCO2 21  0 - 100 mmol/L   Hemoglobin 13.6  13.0 - 17.0 g/dL   HCT 36.6  44.0 - 34.7 %  POCT I-STAT TROPONIN I      Result Value Range   Troponin i, poc 0.00  0.00 - 0.08 ng/mL   Comment 3            Dg Chest Portable 1 View  04/21/2013   *RADIOLOGY REPORT*  Clinical Data: Chest pain.  Hyperglycemia.  PORTABLE CHEST - 1 VIEW  Comparison: 02/19/2013.  Findings: Low volume chest.  Basilar atelectasis.  Low volumes accentuate the cardiopericardial silhouette and pulmonary vascular markings.  No consolidation. Monitoring leads are projected over the chest.  IMPRESSION: Low volume chest.   Original Report Authenticated By: Andreas Newport, M.D.  Date: 04/21/2013  Rate: 89  Rhythm: normal sinus rhythm  QRS Axis: normal  Intervals: normal  ST/T  Wave abnormalities: NSSTT abnl  Conduction Disutrbances:none  Narrative Interpretation:  Old EKG Reviewed: none available  IV fluids provided. 4 units regular insulin provided with prescription. No indication for admission at this time. Stable for discharge home with prescription provided.Outpatient resources or referrals also provided.  MDM  Hyperglycemia in an insulin-dependent diabetic with cardiac history Screening EKG reviewed as above. Labs obtained and reviewed is mildly hyperglycemic. Potassium provided for hypokalemia. IV fluids. Insulin. Chest x-ray reviewed no obvious abnormality. Vital signs and nursing notes reviewed and considered   Sunnie Nielsen, MD 04/21/13 [phone]

## 2013-12-17 ENCOUNTER — Encounter (HOSPITAL_COMMUNITY): Payer: Self-pay | Admitting: Emergency Medicine

## 2013-12-17 ENCOUNTER — Emergency Department (EMERGENCY_DEPARTMENT_HOSPITAL)
Admission: EM | Admit: 2013-12-17 | Discharge: 2013-12-18 | Disposition: A | Discharge status: Other Acute Inpatient Hospital | Payer: Medicaid Other | Source: Home / Self Care | Attending: Emergency Medicine | Admitting: Emergency Medicine

## 2013-12-17 DIAGNOSIS — Z5900 Homelessness unspecified: Secondary | ICD-10-CM

## 2013-12-17 DIAGNOSIS — E119 Type 2 diabetes mellitus without complications: Secondary | ICD-10-CM | POA: Diagnosis present

## 2013-12-17 DIAGNOSIS — F209 Schizophrenia, unspecified: Principal | ICD-10-CM | POA: Diagnosis present

## 2013-12-17 DIAGNOSIS — Z8349 Family history of other endocrine, nutritional and metabolic diseases: Secondary | ICD-10-CM

## 2013-12-17 DIAGNOSIS — F411 Generalized anxiety disorder: Secondary | ICD-10-CM

## 2013-12-17 DIAGNOSIS — F24 Shared psychotic disorder: Secondary | ICD-10-CM | POA: Diagnosis present

## 2013-12-17 DIAGNOSIS — Z79899 Other long term (current) drug therapy: Secondary | ICD-10-CM | POA: Insufficient documentation

## 2013-12-17 DIAGNOSIS — I252 Old myocardial infarction: Secondary | ICD-10-CM | POA: Insufficient documentation

## 2013-12-17 DIAGNOSIS — M109 Gout, unspecified: Secondary | ICD-10-CM | POA: Diagnosis present

## 2013-12-17 DIAGNOSIS — Z9861 Coronary angioplasty status: Secondary | ICD-10-CM

## 2013-12-17 DIAGNOSIS — I10 Essential (primary) hypertension: Secondary | ICD-10-CM

## 2013-12-17 DIAGNOSIS — G8929 Other chronic pain: Secondary | ICD-10-CM

## 2013-12-17 DIAGNOSIS — Z87891 Personal history of nicotine dependence: Secondary | ICD-10-CM

## 2013-12-17 DIAGNOSIS — F329 Major depressive disorder, single episode, unspecified: Secondary | ICD-10-CM

## 2013-12-17 DIAGNOSIS — G4700 Insomnia, unspecified: Secondary | ICD-10-CM | POA: Diagnosis present

## 2013-12-17 DIAGNOSIS — F332 Major depressive disorder, recurrent severe without psychotic features: Secondary | ICD-10-CM | POA: Diagnosis present

## 2013-12-17 DIAGNOSIS — R443 Hallucinations, unspecified: Secondary | ICD-10-CM | POA: Insufficient documentation

## 2013-12-17 DIAGNOSIS — F1410 Cocaine abuse, uncomplicated: Secondary | ICD-10-CM

## 2013-12-17 DIAGNOSIS — Z794 Long term (current) use of insulin: Secondary | ICD-10-CM

## 2013-12-17 DIAGNOSIS — IMO0002 Reserved for concepts with insufficient information to code with codable children: Secondary | ICD-10-CM

## 2013-12-17 DIAGNOSIS — F1420 Cocaine dependence, uncomplicated: Secondary | ICD-10-CM | POA: Diagnosis present

## 2013-12-17 DIAGNOSIS — F39 Unspecified mood [affective] disorder: Secondary | ICD-10-CM

## 2013-12-17 DIAGNOSIS — R739 Hyperglycemia, unspecified: Secondary | ICD-10-CM

## 2013-12-17 DIAGNOSIS — R45850 Homicidal ideations: Secondary | ICD-10-CM

## 2013-12-17 DIAGNOSIS — Z8249 Family history of ischemic heart disease and other diseases of the circulatory system: Secondary | ICD-10-CM

## 2013-12-17 LAB — CBC WITH DIFFERENTIAL/PLATELET
BASOS ABS: 0 10*3/uL (ref 0.0–0.1)
BASOS PCT: 0 % (ref 0–1)
EOS PCT: 1 % (ref 0–5)
Eosinophils Absolute: 0.1 10*3/uL (ref 0.0–0.7)
HCT: 39.5 % (ref 39.0–52.0)
Hemoglobin: 13.6 g/dL (ref 13.0–17.0)
LYMPHS PCT: 51 % — AB (ref 12–46)
Lymphs Abs: 2.8 10*3/uL (ref 0.7–4.0)
MCH: 29.4 pg (ref 26.0–34.0)
MCHC: 34.4 g/dL (ref 30.0–36.0)
MCV: 85.3 fL (ref 78.0–100.0)
MONO ABS: 0.4 10*3/uL (ref 0.1–1.0)
Monocytes Relative: 6 % (ref 3–12)
NEUTROS ABS: 2.3 10*3/uL (ref 1.7–7.7)
Neutrophils Relative %: 41 % — ABNORMAL LOW (ref 43–77)
PLATELETS: 274 10*3/uL (ref 150–400)
RBC: 4.63 MIL/uL (ref 4.22–5.81)
RDW: 12.6 % (ref 11.5–15.5)
WBC: 5.5 10*3/uL (ref 4.0–10.5)

## 2013-12-17 LAB — COMPREHENSIVE METABOLIC PANEL
ALBUMIN: 4.1 g/dL (ref 3.5–5.2)
ALT: 23 U/L (ref 0–53)
AST: 23 U/L (ref 0–37)
Alkaline Phosphatase: 87 U/L (ref 39–117)
BUN: 20 mg/dL (ref 6–23)
CALCIUM: 8.8 mg/dL (ref 8.4–10.5)
CO2: 25 meq/L (ref 19–32)
CREATININE: 1.18 mg/dL (ref 0.50–1.35)
Chloride: 90 mEq/L — ABNORMAL LOW (ref 96–112)
GFR calc Af Amer: 77 mL/min — ABNORMAL LOW (ref >90)
GFR, EST NON AFRICAN AMERICAN: 66 mL/min — AB (ref >90)
Glucose, Bld: 565 mg/dL (ref 70–99)
Potassium: 4.5 mEq/L (ref 3.7–5.3)
SODIUM: 129 meq/L — AB (ref 137–147)
Total Bilirubin: 0.3 mg/dL (ref 0.3–1.2)
Total Protein: 8.1 g/dL (ref 6.0–8.3)

## 2013-12-17 LAB — RAPID URINE DRUG SCREEN, HOSP PERFORMED
AMPHETAMINES: NOT DETECTED
BENZODIAZEPINES: NOT DETECTED
Barbiturates: NOT DETECTED
COCAINE: POSITIVE — AB
OPIATES: NOT DETECTED
Tetrahydrocannabinol: NOT DETECTED

## 2013-12-17 LAB — BASIC METABOLIC PANEL
BUN: 16 mg/dL (ref 6–23)
CO2: 24 meq/L (ref 19–32)
CREATININE: 1.02 mg/dL (ref 0.50–1.35)
Calcium: 8.4 mg/dL (ref 8.4–10.5)
Chloride: 99 mEq/L (ref 96–112)
GFR calc Af Amer: >90 mL/min (ref >90)
GFR calc non Af Amer: 79 mL/min — ABNORMAL LOW (ref >90)
Glucose, Bld: 323 mg/dL — ABNORMAL HIGH (ref 70–99)
Potassium: 4.3 mEq/L (ref 3.7–5.3)
SODIUM: 135 meq/L — AB (ref 137–147)

## 2013-12-17 LAB — CBG MONITORING, ED
GLUCOSE-CAPILLARY: 526 mg/dL — AB (ref 70–99)
GLUCOSE-CAPILLARY: 336 mg/dL — AB (ref 70–99)
GLUCOSE-CAPILLARY: 343 mg/dL — AB (ref 70–99)

## 2013-12-17 LAB — SALICYLATE LEVEL

## 2013-12-17 LAB — ETHANOL: Alcohol, Ethyl (B): <11 mg/dL (ref 0–11)

## 2013-12-17 LAB — ACETAMINOPHEN LEVEL

## 2013-12-17 MED ORDER — HYDROCHLOROTHIAZIDE 12.5 MG PO CAPS
12.5000 mg | ORAL_CAPSULE | Freq: Every day | ORAL | Status: DC
  Administered 2013-12-17 – 2013-12-18 (×2): 12.5 mg via ORAL
  Filled 2013-12-17 (×2): qty 1

## 2013-12-17 MED ORDER — ACETAMINOPHEN 325 MG PO TABS: 650.0000 mg | ORAL_TABLET | ORAL | Status: DC | PRN

## 2013-12-17 MED ORDER — INSULIN ASPART 100 UNIT/ML ~~LOC~~ SOLN
10.0000 [IU] | Freq: Once | SUBCUTANEOUS | Status: AC
  Administered 2013-12-17: 10 [IU] via SUBCUTANEOUS
  Filled 2013-12-17: qty 1

## 2013-12-17 MED ORDER — SODIUM CHLORIDE 0.9 % IV BOLUS (SEPSIS)
2000.0000 mL | Freq: Once | INTRAVENOUS | Status: AC
  Administered 2013-12-17: 2000 mL via INTRAVENOUS

## 2013-12-17 MED ORDER — ARIPIPRAZOLE 2 MG PO TABS
2.0000 mg | ORAL_TABLET | Freq: Every day | ORAL | Status: DC
  Administered 2013-12-17 – 2013-12-18 (×2): 2 mg via ORAL
  Filled 2013-12-17 (×2): qty 1

## 2013-12-17 MED ORDER — METFORMIN HCL 500 MG PO TABS
1000.0000 mg | ORAL_TABLET | Freq: Two times a day (BID) | ORAL | Status: DC
  Administered 2013-12-18 (×2): 1000 mg via ORAL
  Filled 2013-12-17 (×3): qty 2

## 2013-12-17 MED ORDER — ENALAPRIL MALEATE 20 MG PO TABS
20.0000 mg | ORAL_TABLET | Freq: Every day | ORAL | Status: DC
  Administered 2013-12-17 – 2013-12-18 (×2): 20 mg via ORAL
  Filled 2013-12-17 (×2): qty 1

## 2013-12-17 MED ORDER — INSULIN ASPART PROT & ASPART (70-30 MIX) 100 UNIT/ML ~~LOC~~ SUSP
40.0000 [IU] | Freq: Two times a day (BID) | SUBCUTANEOUS | Status: DC
  Administered 2013-12-17 – 2013-12-18 (×3): 40 [IU] via SUBCUTANEOUS
  Filled 2013-12-17: qty 10

## 2013-12-17 MED ORDER — LORAZEPAM 1 MG PO TABS: 1.0000 mg | ORAL_TABLET | Freq: Three times a day (TID) | ORAL | Status: DC | PRN

## 2013-12-17 MED ORDER — INSULIN ASPART 100 UNIT/ML ~~LOC~~ SOLN
0.0000 [IU] | Freq: Three times a day (TID) | SUBCUTANEOUS | Status: DC
  Administered 2013-12-17: 11 [IU] via SUBCUTANEOUS
  Administered 2013-12-18 (×2): 8 [IU] via SUBCUTANEOUS
  Administered 2013-12-18: 11 [IU] via SUBCUTANEOUS
  Filled 2013-12-17 (×3): qty 1

## 2013-12-17 MED ORDER — IBUPROFEN 200 MG PO TABS: 600.0000 mg | ORAL_TABLET | Freq: Three times a day (TID) | ORAL | Status: DC | PRN
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
## 2013-12-17 NOTE — ED Notes (Signed)
Pt arrived to unit, states he has been off of his medications for a week since he got out of jail and he is now hearing voices again. Pt states "I wanted to come in before they got too bad". Pt denies SI/HI or plans to harm himself. No inappropriate behaviors noted. No s/s of distress at this time.

## 2013-12-17 NOTE — Progress Notes (Signed)
[facility] by Nanine MeansJamison Lord NP pending medical clearance.

## 2013-12-17 NOTE — ED Notes (Signed)
Per pt, hearing voices and seeing things-states he has been off meds-feels safe, no SI/HI
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
## 2013-12-18 ENCOUNTER — Encounter (HOSPITAL_COMMUNITY): Payer: Self-pay | Admitting: *Deleted

## 2013-12-18 ENCOUNTER — Inpatient Hospital Stay (HOSPITAL_COMMUNITY)
Admission: AD | Admit: 2013-12-18 | Discharge: 2013-12-28 | DRG: 885 | Disposition: A | Discharge status: Home / Self Care | Payer: Medicaid Other | Attending: Psychiatry | Admitting: Psychiatry

## 2013-12-18 DIAGNOSIS — M109 Gout, unspecified: Secondary | ICD-10-CM

## 2013-12-18 DIAGNOSIS — E1165 Type 2 diabetes mellitus with hyperglycemia: Secondary | ICD-10-CM

## 2013-12-18 DIAGNOSIS — G8929 Other chronic pain: Secondary | ICD-10-CM

## 2013-12-18 DIAGNOSIS — I2510 Atherosclerotic heart disease of native coronary artery without angina pectoris: Secondary | ICD-10-CM

## 2013-12-18 DIAGNOSIS — F3289 Other specified depressive episodes: Secondary | ICD-10-CM

## 2013-12-18 DIAGNOSIS — E1100 Type 2 diabetes mellitus with hyperosmolarity without nonketotic hyperglycemic-hyperosmolar coma (NKHHC): Secondary | ICD-10-CM

## 2013-12-18 DIAGNOSIS — R443 Hallucinations, unspecified: Secondary | ICD-10-CM

## 2013-12-18 DIAGNOSIS — I10 Essential (primary) hypertension: Secondary | ICD-10-CM

## 2013-12-18 DIAGNOSIS — R4182 Altered mental status, unspecified: Secondary | ICD-10-CM

## 2013-12-18 DIAGNOSIS — R112 Nausea with vomiting, unspecified: Secondary | ICD-10-CM

## 2013-12-18 DIAGNOSIS — Z91199 Patient's noncompliance with other medical treatment and regimen due to unspecified reason: Secondary | ICD-10-CM

## 2013-12-18 DIAGNOSIS — F209 Schizophrenia, unspecified: Secondary | ICD-10-CM | POA: Diagnosis present

## 2013-12-18 DIAGNOSIS — R079 Chest pain, unspecified: Secondary | ICD-10-CM

## 2013-12-18 DIAGNOSIS — IMO0002 Reserved for concepts with insufficient information to code with codable children: Secondary | ICD-10-CM

## 2013-12-18 DIAGNOSIS — F329 Major depressive disorder, single episode, unspecified: Secondary | ICD-10-CM

## 2013-12-18 DIAGNOSIS — F1410 Cocaine abuse, uncomplicated: Secondary | ICD-10-CM

## 2013-12-18 DIAGNOSIS — F14259 Cocaine dependence with cocaine-induced psychotic disorder, unspecified: Secondary | ICD-10-CM | POA: Diagnosis present

## 2013-12-18 DIAGNOSIS — Z9119 Patient's noncompliance with other medical treatment and regimen: Secondary | ICD-10-CM

## 2013-12-18 LAB — GLUCOSE, CAPILLARY: GLUCOSE-CAPILLARY: 213 mg/dL — AB (ref 70–99)

## 2013-12-18 LAB — CBG MONITORING, ED
GLUCOSE-CAPILLARY: 279 mg/dL — AB (ref 70–99)
GLUCOSE-CAPILLARY: 306 mg/dL — AB (ref 70–99)
Glucose-Capillary: 282 mg/dL — ABNORMAL HIGH (ref 70–99)

## 2013-12-18 MED ORDER — ENALAPRIL MALEATE 10 MG PO TABS
20.0000 mg | ORAL_TABLET | Freq: Every day | ORAL | Status: DC
  Administered 2013-12-19 – 2013-12-27 (×9): 20 mg via ORAL
  Filled 2013-12-18: qty 20
  Filled 2013-12-18 (×3): qty 1
  Filled 2013-12-18: qty 20
  Filled 2013-12-18: qty 1
  Filled 2013-12-18: qty 4
  Filled 2013-12-18 (×6): qty 1

## 2013-12-18 MED ORDER — INSULIN ASPART PROT & ASPART (70-30 MIX) 100 UNIT/ML ~~LOC~~ SUSP: 20.0000 [IU] | Freq: Two times a day (BID) | SUBCUTANEOUS | Status: DC

## 2013-12-18 MED ORDER — ASPIRIN 81 MG PO CHEW
81.0000 mg | CHEWABLE_TABLET | Freq: Once | ORAL | Status: AC
  Administered 2013-12-19: 81 mg via ORAL
  Filled 2013-12-18 (×2): qty 1

## 2013-12-18 MED ORDER — HYDROCHLOROTHIAZIDE 12.5 MG PO CAPS
12.5000 mg | ORAL_CAPSULE | Freq: Every day | ORAL | Status: DC
  Administered 2013-12-19 – 2013-12-27 (×9): 12.5 mg via ORAL
  Filled 2013-12-18: qty 14
  Filled 2013-12-18: qty 1
  Filled 2013-12-18: qty 14
  Filled 2013-12-18 (×10): qty 1

## 2013-12-18 MED ORDER — METFORMIN HCL 500 MG PO TABS
1000.0000 mg | ORAL_TABLET | Freq: Two times a day (BID) | ORAL | Status: DC
  Filled 2013-12-18: qty 2

## 2013-12-18 MED ORDER — SERTRALINE HCL 25 MG PO TABS
25.0000 mg | ORAL_TABLET | Freq: Every day | ORAL | Status: DC
  Administered 2013-12-19 – 2013-12-20 (×2): 25 mg via ORAL
  Filled 2013-12-18 (×5): qty 1

## 2013-12-18 NOTE — ED Notes (Signed)
70/30 insulin given late due to waiting on pharmacy to send.

## 2013-12-18 NOTE — Consult Note (Signed)
  Diagnosis:  Schizophrenia AA male presented to the ER c/o  Command auditory hallucination and worsening depression.  Patient was recently released from Prison and has not taken any of his medications.  Patient states he is homeless and that he does not have any medications.  He states  command auditory hallucination  With the voices telling him to kill himself is causing him a lot of stress.    He also states he sees shadows and that he constantly hears his fathers voice.  We have accepted patient for admission for safety and stabilization.  Plan:  Admit to Psychiatric inpatient unit-400 hall unit.  Dahlia ByesJosephine Onuoha   PMHNP-BC   Patient was seen face to face for psychiatric evaluation, case discussed with physician extender and reviewed the information documented and agree with the treatment plan.   Avary Eichenberger,JANARDHAHA R. 12/18/2013 8:39 PM

## 2013-12-18 NOTE — ED Notes (Signed)
Notified pharmacy that 70/30 insulin is missing. Asked to send one ASAP.
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
## 2013-12-18 NOTE — Progress Notes (Signed)
Phoned, PA, Leonette MostCharles to make him aware of the new admission . Leonette MostCharles will review pts history and put orders in. Report to Aliene AltesJoanne Glover.

## 2013-12-19 DIAGNOSIS — F14259 Cocaine dependence with cocaine-induced psychotic disorder, unspecified: Secondary | ICD-10-CM | POA: Diagnosis present

## 2013-12-19 DIAGNOSIS — F29 Unspecified psychosis not due to a substance or known physiological condition: Secondary | ICD-10-CM

## 2013-12-19 DIAGNOSIS — F209 Schizophrenia, unspecified: Secondary | ICD-10-CM | POA: Diagnosis present

## 2013-12-19 LAB — HEMOGLOBIN A1C
Hgb A1c MFr Bld: 13 % — ABNORMAL HIGH (ref <5.7)
Mean Plasma Glucose: 326 mg/dL — ABNORMAL HIGH (ref <117)

## 2013-12-19 LAB — GLUCOSE, CAPILLARY
GLUCOSE-CAPILLARY: 465 mg/dL — AB (ref 70–99)
GLUCOSE-CAPILLARY: 408 mg/dL — AB (ref 70–99)
Glucose-Capillary: 333 mg/dL — ABNORMAL HIGH (ref 70–99)
Glucose-Capillary: 383 mg/dL — ABNORMAL HIGH (ref 70–99)
Glucose-Capillary: 324 mg/dL — ABNORMAL HIGH (ref 70–99)
Glucose-Capillary: 222 mg/dL — ABNORMAL HIGH (ref 70–99)

## 2013-12-19 MED ORDER — ALUM & MAG HYDROXIDE-SIMETH 200-200-20 MG/5ML PO SUSP: 30.0000 mL | ORAL | Status: DC | PRN

## 2013-12-19 MED ORDER — ACETAMINOPHEN 325 MG PO TABS: 650.0000 mg | ORAL_TABLET | Freq: Four times a day (QID) | ORAL | Status: DC | PRN

## 2013-12-19 MED ORDER — INSULIN ASPART 100 UNIT/ML ~~LOC~~ SOLN
0.0000 [IU] | Freq: Three times a day (TID) | SUBCUTANEOUS | Status: DC
Start: 2013-12-19 — End: 2013-12-21
  Administered 2013-12-19: 15 [IU] via SUBCUTANEOUS
  Administered 2013-12-19: 11 [IU] via SUBCUTANEOUS
  Administered 2013-12-19: 15 [IU] via SUBCUTANEOUS
  Administered 2013-12-20: 8 [IU] via SUBCUTANEOUS
  Administered 2013-12-20: 11 [IU] via SUBCUTANEOUS
  Administered 2013-12-20: 15 [IU] via SUBCUTANEOUS
  Administered 2013-12-21: 8 [IU] via SUBCUTANEOUS

## 2013-12-19 MED ORDER — INSULIN ASPART 100 UNIT/ML ~~LOC~~ SOLN: 2.0000 [IU] | Freq: Three times a day (TID) | SUBCUTANEOUS | Status: DC

## 2013-12-19 MED ORDER — ACETAMINOPHEN 325 MG PO TABS: 650.0000 mg | ORAL_TABLET | ORAL | Status: DC | PRN

## 2013-12-19 MED ORDER — INSULIN ASPART 100 UNIT/ML ~~LOC~~ SOLN
4.0000 [IU] | Freq: Three times a day (TID) | SUBCUTANEOUS | Status: DC
  Administered 2013-12-20 (×2): 4 [IU] via SUBCUTANEOUS

## 2013-12-19 MED ORDER — LORAZEPAM 1 MG PO TABS
1.0000 mg | ORAL_TABLET | Freq: Three times a day (TID) | ORAL | Status: DC | PRN
  Administered 2013-12-20: 1 mg via ORAL
  Filled 2013-12-19: qty 1

## 2013-12-19 MED ORDER — MAGNESIUM HYDROXIDE 400 MG/5ML PO SUSP: 30.0000 mL | Freq: Every day | ORAL | Status: DC | PRN

## 2013-12-19 MED ORDER — ARIPIPRAZOLE 2 MG PO TABS
2.0000 mg | ORAL_TABLET | Freq: Every day | ORAL | Status: DC
  Administered 2013-12-19 – 2013-12-21 (×3): 2 mg via ORAL
  Filled 2013-12-19 (×5): qty 1

## 2013-12-19 MED ORDER — INSULIN GLARGINE 100 UNIT/ML ~~LOC~~ SOLN
20.0000 [IU] | Freq: Every day | SUBCUTANEOUS | Status: DC
  Administered 2013-12-19: 20 [IU] via SUBCUTANEOUS

## 2013-12-19 MED ORDER — HYDROCHLOROTHIAZIDE 12.5 MG PO CAPS: 12.5000 mg | ORAL_CAPSULE | Freq: Every day | ORAL | Status: DC

## 2013-12-19 MED ORDER — METFORMIN HCL 500 MG PO TABS
1000.0000 mg | ORAL_TABLET | Freq: Two times a day (BID) | ORAL | Status: DC
  Administered 2013-12-19 – 2013-12-27 (×18): 1000 mg via ORAL
  Filled 2013-12-19 (×4): qty 2
  Filled 2013-12-19: qty 42
  Filled 2013-12-19 (×12): qty 2
  Filled 2013-12-19 (×3): qty 42
  Filled 2013-12-19 (×5): qty 2

## 2013-12-19 MED ORDER — INSULIN ASPART 100 UNIT/ML ~~LOC~~ SOLN
0.0000 [IU] | Freq: Every day | SUBCUTANEOUS | Status: DC
  Administered 2013-12-19: 2 [IU] via SUBCUTANEOUS
  Administered 2013-12-20: 3 [IU] via SUBCUTANEOUS

## 2013-12-19 MED ORDER — ENALAPRIL MALEATE 20 MG PO TABS: 20.0000 mg | ORAL_TABLET | Freq: Every day | ORAL | Status: DC

## 2013-12-19 MED ORDER — INSULIN ASPART PROT & ASPART (70-30 MIX) 100 UNIT/ML ~~LOC~~ SUSP
40.0000 [IU] | Freq: Two times a day (BID) | SUBCUTANEOUS | Status: DC
  Administered 2013-12-19 (×2): 40 [IU] via SUBCUTANEOUS

## 2013-12-19 MED ORDER — IBUPROFEN 200 MG PO TABS
600.0000 mg | ORAL_TABLET | Freq: Three times a day (TID) | ORAL | Status: DC | PRN
  Administered 2013-12-25 – 2013-12-27 (×3): 600 mg via ORAL
  Filled 2013-12-19 (×3): qty 3

## 2013-12-19 NOTE — Progress Notes (Signed)
Adult Psychoeducational Group Note  Date:  12/19/2013 Time:  9:36 PM  Group Topic/Focus:  Wrap-Up Group:   The focus of this group is to help patients review their daily goal of treatment and discuss progress on daily workbooks.  Participation Level:  Minimal  Participation Quality:  Appropriate  Affect:  Flat  Cognitive:  Alert  Insight: Limited  Engagement in Group:  Limited  Modes of Intervention:  Education, Socialization and Support  Additional Comments:  Patient attended and participated in group tonight. He reports having a good day. He attended his groups, went for meals and did some drawing today. He advised that he need to speak with the social worker here to have him help setting up support in the community for him.  Lita MainsFrancis, Jalyssa Fleisher [facility] 12/19/2013, 9:36 PM

## 2013-12-19 NOTE — Progress Notes (Signed)
Patient ID: Coralie KeensLarry B Nichols, male   DOB: 03/01/1956, 58 y.o.   MRN: [id number] D)  Resting quietly, eyes closed, resp reg, unlabored, no c/o's voiced. A)  Will continue to monitor for safety R)  Safety maintained.
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
## 2013-12-19 NOTE — [facility] Group Notes (Signed)
[facility] Group Notes:  (Nursing/MHT/Case Management/Adjunct)  Date:  12/19/2013  Time:  1:25 PM  Type of Therapy:  Psychoeducational Skills-- HEALTHY SUPPORT SYSTEMS   Participation Level:  Active  Participation Quality:  Appropriate  Affect:  Appropriate  Cognitive:  Appropriate  Insight:  Appropriate  Engagement in Group:  Engaged  Modes of Intervention:  Discussion, Education and Exploration  Summary of Progress/Problems: Psychoeducational review with a focus on healthy support systems. Patient was able to identify positive supports for his mental health, including depression support groups and maintaining compliance with his medications. He states ''I'm going to get back on my meds and stay on them cause that's how I do well ''   Malva LimesStrader, [name] 12/19/2013, 1:25 PM

## 2013-12-19 NOTE — Progress Notes (Signed)
Pt noon CBG was 465. Pt reports feeling slightly dizzy. Dr Theotis Barrioasul notified of pt's condition. 15 unit of novolog ordered. Medication administered as ordered. Writer encourage pt to monitor and stick with carb modified diet. Will continue to monitor pt.
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
## 2013-12-19 NOTE — [facility] Group Notes (Signed)
[facility] Group Notes:  (Clinical Social Work)  12/19/2013   11:15am-12:00pm  Summary of Progress/Problems:  The main focus of today's process group was to listen to a variety of genres of music and to identify that different types of music provoke different responses.  The patient then was able to identify personally what was soothing for them, as well as energizing.  Handouts were used to record feelings evoked, as well as how patient can personally use this knowledge in sleep habits, with depression, and with other symptoms.  The patient expressed understanding of concepts, as well as knowledge of how each type of music affected him and how this can be used at home as a wellness/recovery tool.  He particularly liked older music and responded much more positively when the emphasis was placed on that.  Type of Therapy:  Music Therapy   Participation Level:  Active  Participation Quality:  Attentive and Sharing  Affect:  Blunted  Cognitive:  Oriented  Insight:  Engaged  Engagement in Therapy:  Engaged  Modes of Intervention:   Activity, Exploration  Ambrose MantleMareida Grossman-Orr, LCSW 12/19/2013, 12:30pm

## 2013-12-19 NOTE — [facility] Group Notes (Signed)
[facility] Group Notes:  (Nursing/MHT/Case Management/Adjunct)  Date:  12/19/2013  Time:  1:24 PM  Type of Therapy:  Psychoeducational Skills--self inventory review  Participation Level:  Active  Participation Quality:  Appropriate  Affect:  Appropriate  Cognitive:  Appropriate  Insight:  Appropriate  Engagement in Group:  Engaged  Modes of Intervention:  Discussion, Education and Exploration  Summary of Progress/Problems:  [name], [name] 12/19/2013, 1:24 PM

## 2013-12-19 NOTE — Progress Notes (Signed)
Report given to tiffany rn about pt transfer to ED
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
## 2013-12-19 NOTE — Progress Notes (Signed)
Patient ID: Justin KeensLarry B Delacruz, male   DOB: 08/03/1956, 58 y.o.   MRN: [id number] Patient in group therapy when attempt made to meet and complete PSA Carney Bernatherine C Harrill, LCSW

## 2013-12-19 NOTE — Psychosocial Assessment (Signed)
Pt 1700 cbg was 408. Pt denies any symptoms. Dr. Theotis Barrioasul notified of pt's condition. Dr ordered consult to hospitalist and given scheduled diabetic medication. Pt CBG taken after an hour and CBG was 324. hospitalist  On call notified and recommend pt transfer to ED.  Report given to sue rn.
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
## 2013-12-19 NOTE — Progress Notes (Signed)
Patient ID: [name], male   DOB: 06/24/1956, 58 y.o.   MRN: [id number] D: Patient in dayroom drawing and interacting with peers. Pt denies suicidal /homicidal ideation intent and plan. Pt denies auditory and visual hallucination.  Pt denies any needs or concerns. Cooperative with assessment. No acute distressed noted at this time.   A: Met with pt 1:1. Writer educated pt on diabetic foods. Medications administered as prescribed. Writer encouraged pt to discuss feelings. Pt encouraged to come to staff with any questions or concerns.   R: Patient is safe on the unit. He is complaint with medications and denies any adverse reaction. Continue current POC.

## 2013-12-20 DIAGNOSIS — F1410 Cocaine abuse, uncomplicated: Secondary | ICD-10-CM

## 2013-12-20 DIAGNOSIS — R45851 Suicidal ideations: Secondary | ICD-10-CM

## 2013-12-20 LAB — GLUCOSE, CAPILLARY
GLUCOSE-CAPILLARY: 334 mg/dL — AB (ref 70–99)
GLUCOSE-CAPILLARY: 473 mg/dL — AB (ref 70–99)
Glucose-Capillary: 234 mg/dL — ABNORMAL HIGH (ref 70–99)
Glucose-Capillary: 298 mg/dL — ABNORMAL HIGH (ref 70–99)
Glucose-Capillary: 369 mg/dL — ABNORMAL HIGH (ref 70–99)
Glucose-Capillary: 279 mg/dL — ABNORMAL HIGH (ref 70–99)

## 2013-12-20 LAB — BASIC METABOLIC PANEL
BUN: 16 mg/dL (ref 6–23)
CALCIUM: 9.5 mg/dL (ref 8.4–10.5)
CO2: 24 meq/L (ref 19–32)
CREATININE: 1.06 mg/dL (ref 0.50–1.35)
Chloride: 98 mEq/L (ref 96–112)
GFR calc Af Amer: 88 mL/min — ABNORMAL LOW (ref >90)
GFR calc non Af Amer: 76 mL/min — ABNORMAL LOW (ref >90)
GLUCOSE: 337 mg/dL — AB (ref 70–99)
Potassium: 4.5 mEq/L (ref 3.7–5.3)
Sodium: 135 mEq/L — ABNORMAL LOW (ref 137–147)

## 2013-12-20 MED ORDER — OLANZAPINE 10 MG PO TBDP
10.0000 mg | ORAL_TABLET | Freq: Three times a day (TID) | ORAL | Status: DC | PRN
  Administered 2013-12-20: 10 mg via ORAL
  Filled 2013-12-20: qty 1

## 2013-12-20 MED ORDER — CLONIDINE HCL 0.1 MG PO TABS: 0.1000 mg | ORAL_TABLET | Freq: Three times a day (TID) | ORAL | Status: DC | PRN

## 2013-12-20 MED ORDER — SERTRALINE HCL 50 MG PO TABS
50.0000 mg | ORAL_TABLET | Freq: Every day | ORAL | Status: DC
  Administered 2013-12-21: 50 mg via ORAL
  Filled 2013-12-20 (×2): qty 1

## 2013-12-20 MED ORDER — INSULIN ASPART PROT & ASPART (70-30 MIX) 100 UNIT/ML ~~LOC~~ SUSP
40.0000 [IU] | Freq: Two times a day (BID) | SUBCUTANEOUS | Status: DC
  Administered 2013-12-20 – 2013-12-21 (×2): 40 [IU] via SUBCUTANEOUS

## 2013-12-20 NOTE — Progress Notes (Signed)
Patient ID: Justin KeensLarry B Delacruz, male   DOB: 01/20/1956, 58 y.o.   MRN: [id number] D: patient continues to have depressive symptoms; he rates his depress as a "17."  He rates his hopelessness as a 7.  Patient states on his self inventory, "I'm homeless, but things will change in the next 27 days."  Patient reports that he is having auditory hallucinations. He denies SI/HI. Patient's CBGs have been elevated since admission.  His CBG at 1200 was 334.  Patient is adamant about taking lantus at night stating, "I'm telling you that don't work for me."  He reviewed his food choices at lunch with this nurse and is hoping to get his sugar down.  He requested that he be allowed to take his insulin a full 30 minutes before his meal, as he get nauseated.  He stated in prison, his blook sugars were poorly controlled.  Patient has a diabetic consult ordered.  A: CBGs monitored and medicate as necessary.  Safety checks completed every 15 minutes per protocol.  Review diet choices with patient as needed.  R: patient is receptive to staff; his behavior is appropriate.

## 2013-12-20 NOTE — Progress Notes (Signed)
Patient ID: Justin KeensLarry B Delacruz, male   DOB: 04/09/1956, 58 y.o.   MRN: [id number] Patient's CBG at 1645 was 473.  Fransisca KaufmannLaura Davis, NP was notified about elevated blood sugar.  An order for 40 U 70/30 had just been put in.  He received 40 U of 70/30 and 15 U novalog.  No additional orders needed.  CBG to be checked in 1 hour.
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
## 2013-12-20 NOTE — Progress Notes (Signed)
Patient ID: Justin KeensLarry B Delacruz, male   DOB: 10/16/1956, 58 y.o.   MRN: [id number] Patient's CBG retaken: 369.  Notified NP of same.  No other action at this time.

## 2013-12-20 NOTE — Progress Notes (Signed)
Adult Psychoeducational Group Note  Date:  12/20/2013 Time:  10:15 PM  Group Topic/Focus:  Wrap-Up Group:   The focus of this group is to help patients review their daily goal of treatment and discuss progress on daily workbooks.  Participation Level:  None  Participation Quality:  Inattentive  Affect:  Flat  Cognitive:  Alert  Insight: None  Engagement in Group:  None  Modes of Intervention:  Discussion  Additional Comments:  Pt did not want to share anything in group tonight.  Frederica Chrestman, Alfredia ClientAndreia 12/20/2013, 10:15 PM

## 2013-12-20 NOTE — [facility] Group Notes (Signed)
[facility] LCSW Group Therapy  12/20/2013 2:47 PM  Type of Therapy:  Group Therapy  Participation Level:  Minimal  Participation Quality:  Drowsy and Resistant  Affect:  Lethargic  Cognitive:  Oriented  Insight:  Limited  Engagement in Therapy:  Lacking  Modes of Intervention:  Discussion, Exploration and Support  Summary of Progress/Problems:Group today consisted of the topic of obstacles.  Group discussed how to overcome problems, why problems persist and how to overcome problems in which group facilitated discussion.  [name] was part of group, but slept through most of the discussions. He started out with defining what an obstacle was to him such as his past, but was unable to go deeper in discussion, and ultimately left group.  His mood was calm and cooperative, but remained unsupportive and disengaged in discussion.  [name], Melton Walls N 12/20/2013, 2:47 PM

## 2013-12-20 NOTE — [facility] Group Notes (Signed)
[facility]   12/20/2013 9:56 AM  Participation Quality:  Did not attend. Sleeping in bed.    Raye Sorrowoble, Anberlin Diez N

## 2013-12-20 NOTE — Progress Notes (Signed)
Inpatient Diabetes Program Recommendations  AACE/ADA: New Consensus Statement on Inpatient Glycemic Control (2013)  Target Ranges:  Prepandial:   less than 140 mg/dL      Peak postprandial:   less than 180 mg/dL (1-2 hours)      Critically ill patients:  140 - 180 mg/dL   Reason for Visit: Hyperglycemia  Diabetes history: Type 2 DM Outpatient Diabetes medications: 70/30 40 units bid and metformin 1000 mg bid and Regular insulin 2-15 (for units tidwc Current orders for Inpatient glycemic control: Lantus 20 units QHS and Novolog 4 units tidwc and moderate correction tidwc and hs.   Results for [name], Laden B (MRN [id number]) as of 12/20/2013 13:22  Ref. Range 12/19/2013 11:53 12/19/2013 13:40 12/19/2013 17:00 12/19/2013 18:45 12/19/2013 21:13 12/19/2013 23:03 12/20/2013 06:11 12/20/2013 11:43  Glucose-Capillary Latest Range: 70-99 mg/dL 130465 (H) 865383 (H) 784408 (H) 324 (H) 222 (H) 234 (H) 298 (H) 334 (H)   Uncontrolled blood sugars with current regimen. Pt requesting to go back to 70/30 insulin.  Inpatient Diabetes Program Recommendations Insulin - Basal: D/C Lantus. Begin 70/30 40 units bid. Correction (SSI): Continue with Novolog moderate tidwc and hs Insulin - Meal Coverage: D/C Novolog 4 units tidwc Oral Agents: Metformin 1000 mg bid HgbA1C: 13.0% - uncontrolled Diet: CHO mod med - Encourage pt to make healthy choices using portion control in the cafeteria and at snack time.  Note: Will follow daily.  Will likely need daily adjustments until CBGs >180 mg/dL.  Thank you. Ailene Ardshonda Emrys Mceachron, RD, LDN, CDE Inpatient Diabetes Coordinator [phone]

## 2013-12-20 NOTE — Progress Notes (Signed)
Patient ID: [name], male   DOB: 04/05/1956, 58 y.o.   MRN: [id number] D)  CBG's have been elevated today, order had been written to transfer pt to ED for eval and treatment, but pt refused to go.  Stated has been poorly controlled while in prison, seemed rather doubtful that it could be well controlled, but also admitted he probably hadn't been as conscious of his diet as he should be.  Has been drinking more water, he stated and didn't eat as much at dinner tonight to try to help bring it down.  NP made aware of cbg's, orders were written to start lantus insulin, which pt initially refused and said it wouldn't work, but later agreed to try.  When I went to pt's room to let him know I would be drawing up his new insulin, he was sitting on his bed with a honeybun in his hand, and had taken a bite.  Agreed not to eat it, and threw it away, but wasn't happy.  Was able to administer insulin himself, and was given a small pack of sugar free cookies instead of the honeybun.   A)  Encouraged pt to make better choices, diabetic consult  In place, will continue to monitor for safety, labwork for am, continue POC, insulin coverage as ordered. R)  Safety maintained

## 2013-12-20 NOTE — Plan of Care (Signed)
Problem: Undesirable Food Choices (NB-1.7) Goal: Nutrition education Formal process to instruct or train a patient/client in a skill or to impart knowledge to help patients/clients voluntarily manage or modify food choices and eating behavior to maintain or improve health. Outcome: Adequate for Discharge  RD consulted for nutrition education regarding diabetes. Patient is currently eating well.  Drinks mostly water, verbalizes indiscretions.  Currently homeless.  Did not have DM medications for the past week.    Lab Results  Component Value Date    HGBA1C 13.0* 12/19/2013  HGBA1C has decreased from last year of 14.8.  RD provided "Carbohydrate Counting for People with Diabetes" handout from the Academy of Nutrition and Dietetics. Discussed different food groups and their effects on blood sugar, emphasizing carbohydrate-containing foods. Provided list of carbohydrates and recommended serving sizes of common foods.  Discussed importance of controlled and consistent carbohydrate intake throughout the day. Provided examples of ways to balance meals/snacks and encouraged intake of high-fiber, whole grain complex carbohydrates. Teach back method used.  Expect fair compliance.  Body mass index is 30.26 kg/(m^2). Pt meets criteria for obesity grade 1 based on current BMI.  Current diet order is CHO MOC, patient is consuming approximately 100% of meals at this time. Labs and medications reviewed. No further nutrition interventions warranted at this time. RD contact information provided. If additional nutrition issues arise, please re-consult RD.  Oran ReinLaura Keera Altidor, RD, LDN Clinical Inpatient Dietitian Pager:  [phone] Weekend and after hours pager:  [phone]

## 2013-12-20 NOTE — Tx Team (Addendum)
Date:12/20/2013  Progress in Treatment:  Attending groups: Yes  Participating in groups: Yes  Taking medication as prescribed: Yes  Tolerating medication: Yes  Family/Significant othe contact made: None will need to consent Patient understands diagnosis:No, patient is not attending groups. Discussing patient identified problems/goals with staff: No Medical problems stabilized or resolved: Yes  Denies suicidal/homicidal ideation:  Yes Patient has not harmed self or Others: Yes   New problem(s) identified: none  Discharge Plan or Barriers: Will assess prior to DC for appropriate referrals.  Additional comments: n/a   Reason for Continuation of Hospitalization:   Delusions  Depression Medication stabilization Suicidal ideation    Estimated length of stay: 3-5 days     For review of initial/current patient goals, please see plan of care.  Attendees:  Patient:    Family:    Physician: Dr.Akintayo, MD  12/20/2013 10:34 AM   Nursing: Rodell PernaPatrice RN 12/20/2013 10:34 AM   Clinical Social Worker Mordecai RasmussenHannah Rayland Hamed, Alexander MtLCSW, MSW 12/20/2013 10:34 AM   Other: Vernona RiegerLaura, NP 12/20/2013 10:34 AM   Other:  12/20/2013 10:34 AM   Other:  12/20/2013 10:34 AM   Other: 12/20/2013 10:34 AM   Scribe for Treatment Team:  Mordecai RasmussenHannah Amoni Morales, Alexander MtLCSW, MSW   12/20/2013

## 2013-12-21 DIAGNOSIS — F1999 Other psychoactive substance use, unspecified with unspecified psychoactive substance-induced disorder: Secondary | ICD-10-CM

## 2013-12-21 DIAGNOSIS — F3289 Other specified depressive episodes: Secondary | ICD-10-CM

## 2013-12-21 DIAGNOSIS — F209 Schizophrenia, unspecified: Principal | ICD-10-CM

## 2013-12-21 DIAGNOSIS — F329 Major depressive disorder, single episode, unspecified: Secondary | ICD-10-CM

## 2013-12-21 DIAGNOSIS — F1420 Cocaine dependence, uncomplicated: Secondary | ICD-10-CM

## 2013-12-21 LAB — GLUCOSE, CAPILLARY
GLUCOSE-CAPILLARY: 317 mg/dL — AB (ref 70–99)
Glucose-Capillary: 268 mg/dL — ABNORMAL HIGH (ref 70–99)
Glucose-Capillary: 287 mg/dL — ABNORMAL HIGH (ref 70–99)
Glucose-Capillary: 144 mg/dL — ABNORMAL HIGH (ref 70–99)

## 2013-12-21 MED ORDER — INSULIN ASPART PROT & ASPART (70-30 MIX) 100 UNIT/ML ~~LOC~~ SUSP
42.0000 [IU] | Freq: Two times a day (BID) | SUBCUTANEOUS | Status: DC
  Administered 2013-12-21 – 2013-12-27 (×13): 42 [IU] via SUBCUTANEOUS
  Filled 2013-12-21 (×2): qty 0.42

## 2013-12-21 MED ORDER — SERTRALINE HCL 100 MG PO TABS
100.0000 mg | ORAL_TABLET | Freq: Every day | ORAL | Status: DC
  Administered 2013-12-22 – 2013-12-23 (×2): 100 mg via ORAL
  Filled 2013-12-21 (×3): qty 1

## 2013-12-21 MED ORDER — INSULIN ASPART 100 UNIT/ML ~~LOC~~ SOLN: 0.0000 [IU] | Freq: Every day | SUBCUTANEOUS | Status: DC

## 2013-12-21 MED ORDER — INSULIN ASPART 100 UNIT/ML ~~LOC~~ SOLN
0.0000 [IU] | Freq: Three times a day (TID) | SUBCUTANEOUS | Status: DC
  Administered 2013-12-21: 11 [IU] via SUBCUTANEOUS
  Administered 2013-12-21: 15 [IU] via SUBCUTANEOUS
  Administered 2013-12-22: 7 [IU] via SUBCUTANEOUS
  Administered 2013-12-22: 11 [IU] via SUBCUTANEOUS
  Administered 2013-12-22: 7 [IU] via SUBCUTANEOUS
  Administered 2013-12-23: 4 [IU] via SUBCUTANEOUS
  Administered 2013-12-23: 11 [IU] via SUBCUTANEOUS
  Administered 2013-12-23: 15 [IU] via SUBCUTANEOUS
  Administered 2013-12-24 (×2): 4 [IU] via SUBCUTANEOUS
  Administered 2013-12-24: 3 [IU] via SUBCUTANEOUS
  Administered 2013-12-25: 4 [IU] via SUBCUTANEOUS
  Administered 2013-12-25: 11 [IU] via SUBCUTANEOUS
  Administered 2013-12-26 (×3): 4 [IU] via SUBCUTANEOUS
  Administered 2013-12-27: 7 [IU] via SUBCUTANEOUS
  Administered 2013-12-27: 3 [IU] via SUBCUTANEOUS
  Administered 2013-12-27: 7 [IU] via SUBCUTANEOUS
  Administered 2013-12-28: 4 [IU] via SUBCUTANEOUS

## 2013-12-21 MED ORDER — ARIPIPRAZOLE 5 MG PO TABS
5.0000 mg | ORAL_TABLET | Freq: Every day | ORAL | Status: DC
  Administered 2013-12-22 – 2013-12-23 (×2): 5 mg via ORAL
  Filled 2013-12-21 (×3): qty 1
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
## 2013-12-21 NOTE — Progress Notes (Addendum)
D: Pt denies SI/HI/AVH/pain. Pt +ve increased agitation and irritability.   Pt was agitated earlier about taking insulin, getting his blood drawn so much and not being able to get ice cream after group. (Pt stated he was hearing voices when he got agitated but after he calmed down he stated he was not hearing them ).   A: Pt was offered support and encouragement. Pt was given scheduled medications. Pt was encourage to attend groups. Q 15 minute checks were done for safety. Explained to pt the importance of monitoring his blood sugar and the importance of monitoring his blood.   R:Pt attends groups and interacts well with peers and staff. Pt is taking medication. Pt has no complaints at this time.Pt receptive to treatment and safety maintained on unit. Pt much calmer after talk and apologized for his actions. Pt is pleasant and cooperative later in the evening.

## 2013-12-21 NOTE — Progress Notes (Addendum)
Patient ID: MINOR IDEN, male   DOB: 08-04-1956, 58 y.o.   MRN: [id number] [facility] MD Progress Note  12/21/2013 10:57 AM [name]  MRN:  [id number] Subjective:   Patient states "I am still feeling depressed, anxious and homicidal.'' Objective:  Patient continues to endorse hearing voices and seeing things that other people cannot see. He reports feeling anxious, excessive worries and depressive symptoms. However, patient states that she slept well last night, he endorsed homicidal thoughts but denies suicidal thoughts. Patient is compliant with his medications and did not endorsed any adverse effects. Diagnosis:   DSM5: Schizophrenia Disorders:  Schizophrenia (295.7) Obsessive-Compulsive Disorders:   Trauma-Stressor Disorders:   Substance/Addictive Disorders:   Depressive Disorders:   Total Time spent with patient: 30 minutes  Axis I: Schizophrenia Unspecified, Cocaine abuse             Depressive disorder, NOS Axis II: Deferred Axis III:  Past Medical History  Diagnosis Date  . Diabetes mellitus   . Hypertension   . Gout   . MI (myocardial infarction)   . Chronic pain     right elbow   Axis IV: economic problems, housing problems, occupational problems, other psychosocial or environmental problems and problems with primary support group Axis V: 31-40 impairment in reality testing  ADL's:  Intact  Sleep: Fair  Appetite:  Fair  Suicidal Ideation:  Passive SI no plan  Homicidal Ideation:  Denies  AEB (as evidenced by):  Psychiatric Specialty Exam: Physical Exam  Review of Systems  Constitutional: Negative.   HENT: Negative.   Eyes: Negative.   Respiratory: Negative.   Cardiovascular: Negative.   Gastrointestinal: Negative.   Genitourinary: Negative.   Musculoskeletal: Negative.   Skin: Negative.   Neurological: Negative.   Endo/Heme/Allergies: Negative.   Psychiatric/Behavioral: Positive for depression, suicidal ideas, hallucinations and substance abuse.  Negative for memory loss. The patient is nervous/anxious and has insomnia.     Blood pressure 113/71, pulse 106, temperature 97.8 F (36.6 C), temperature source Oral, resp. rate 20, height '5\' 4"'  (1.626 m), weight 80 kg (176 lb 5.9 oz), SpO2 99.00%.Body mass index is 30.26 kg/(m^2).  General Appearance: Disheveled  Eye Sport and exercise psychologist::  Fair  Speech:  Clear and Coherent  Volume:  Decreased  Mood:  Dysphoric and Irritable  Affect:  Blunt  Thought Process:  Circumstantial  Orientation:  Full (Time, Place, and Person)  Thought Content:  Hallucinations: Auditory Visual and Rumination  Suicidal Thoughts:  denies  Homicidal Thoughts: Yes  Memory:  Immediate;   Good Recent;   Good Remote;   Good  Judgement:  Impaired  Insight:  Shallow  Psychomotor Activity:  Decreased  Concentration:  Fair  Recall:  Good  Fund of Knowledge:Fair  Language: Good  Akathisia:  No  Handed:  Right  AIMS (if indicated):     Assets:  Communication Skills Desire for Improvement Leisure Time Physical Health Resilience Social Support  Sleep:  Number of Hours: 5.25   Musculoskeletal: Strength & Muscle Tone: within normal limits Gait & Station: normal Patient leans: N/A  Current Medications: Current Facility-Administered Medications  Medication Dose Route Frequency Provider Last Rate Last Dose  . acetaminophen (TYLENOL) tablet 650 mg  650 mg Oral Q6H PRN Dara Hoyer, PA-C      . alum & mag hydroxide-simeth (MAALOX/MYLANTA) 200-200-20 MG/5ML suspension 30 mL  30 mL Oral Q4H PRN Delfin Gant, NP      . Derrill Memo ON 12/22/2013] ARIPiprazole (ABILIFY) tablet 5 mg  5 mg  Oral Daily Anaisabel Pederson      . cloNIDine (CATAPRES) tablet 0.1 mg  0.1 mg Oral Q8H PRN Elmarie Shiley, NP      . enalapril (VASOTEC) tablet 20 mg  20 mg Oral Daily Dara Hoyer, PA-C   20 mg at 12/21/13 0623  . hydrochlorothiazide (MICROZIDE) capsule 12.5 mg  12.5 mg Oral Daily Dara Hoyer, PA-C   12.5 mg at 12/21/13 0800  . ibuprofen  (ADVIL,MOTRIN) tablet 600 mg  600 mg Oral Q8H PRN Delfin Gant, NP      . insulin aspart (novoLOG) injection 0-15 Units  0-15 Units Subcutaneous TID WC Delfin Gant, NP   8 Units at 12/21/13 [phone]  . insulin aspart (novoLOG) injection 0-5 Units  0-5 Units Subcutaneous QHS Dara Hoyer, PA-C   3 Units at 12/20/13 2126  . insulin aspart protamine- aspart (NOVOLOG MIX 70/30) injection 40 Units  40 Units Subcutaneous BID WC Elmarie Shiley, NP   40 Units at 12/21/13 0800  . LORazepam (ATIVAN) tablet 1 mg  1 mg Oral Q8H PRN Delfin Gant, NP   1 mg at 12/20/13 1430  . magnesium hydroxide (MILK OF MAGNESIA) suspension 30 mL  30 mL Oral Daily PRN Dara Hoyer, PA-C      . metFORMIN (GLUCOPHAGE) tablet 1,000 mg  1,000 mg Oral BID WC Delfin Gant, NP   1,000 mg at 12/21/13 0759  . OLANZapine zydis (ZYPREXA) disintegrating tablet 10 mg  10 mg Oral Q8H PRN Elmarie Shiley, NP   10 mg at 12/20/13 2127  . [START ON 12/22/2013] sertraline (ZOLOFT) tablet 100 mg  100 mg Oral Daily Jermane Brayboy        Lab Results:  Results for orders placed during the hospital encounter of 12/18/13 (from the past 48 hour(s))  GLUCOSE, CAPILLARY     Status: Abnormal   Collection Time    12/19/13 11:53 AM      Result Value Ref Range   Glucose-Capillary 465 (*) 70 - 99 mg/dL   Comment 1 Documented in Chart     Comment 2 Notify RN    GLUCOSE, CAPILLARY     Status: Abnormal   Collection Time    12/19/13  1:40 PM      Result Value Ref Range   Glucose-Capillary 383 (*) 70 - 99 mg/dL  GLUCOSE, CAPILLARY     Status: Abnormal   Collection Time    12/19/13  5:00 PM      Result Value Ref Range   Glucose-Capillary 408 (*) 70 - 99 mg/dL   Comment 1 Notify RN    GLUCOSE, CAPILLARY     Status: Abnormal   Collection Time    12/19/13  6:45 PM      Result Value Ref Range   Glucose-Capillary 324 (*) 70 - 99 mg/dL   Comment 1 Notify RN    GLUCOSE, CAPILLARY     Status: Abnormal   Collection Time    12/19/13   9:13 PM      Result Value Ref Range   Glucose-Capillary 222 (*) 70 - 99 mg/dL  GLUCOSE, CAPILLARY     Status: Abnormal   Collection Time    12/19/13 11:03 PM      Result Value Ref Range   Glucose-Capillary 234 (*) 70 - 99 mg/dL  GLUCOSE, CAPILLARY     Status: Abnormal   Collection Time    12/20/13  6:11 AM      Result Value Ref  Range   Glucose-Capillary 298 (*) 70 - 99 mg/dL   Comment 1 Notify RN    BASIC METABOLIC PANEL     Status: Abnormal   Collection Time    12/20/13  6:30 AM      Result Value Ref Range   Sodium 135 (*) 137 - 147 mEq/L   Potassium 4.5  3.7 - 5.3 mEq/L   Chloride 98  96 - 112 mEq/L   CO2 24  19 - 32 mEq/L   Glucose, Bld 337 (*) 70 - 99 mg/dL   BUN 16  6 - 23 mg/dL   Creatinine, Ser 1.06  0.50 - 1.35 mg/dL   Calcium 9.5  8.4 - 10.5 mg/dL   GFR calc non Af Amer 76 (*) >90 mL/min   GFR calc Af Amer 88 (*) >90 mL/min   Comment: (NOTE)     The eGFR has been calculated using the CKD EPI equation.     This calculation has not been validated in all clinical situations.     eGFR's persistently <90 mL/min signify possible Chronic Kidney     Disease.     Performed at [facility]  GLUCOSE, CAPILLARY     Status: Abnormal   Collection Time    12/20/13 11:43 AM      Result Value Ref Range   Glucose-Capillary 334 (*) 70 - 99 mg/dL   Comment 1 Documented in Chart     Comment 2 Notify RN    GLUCOSE, CAPILLARY     Status: Abnormal   Collection Time    12/20/13  4:45 PM      Result Value Ref Range   Glucose-Capillary 473 (*) 70 - 99 mg/dL   Comment 1 Documented in Chart     Comment 2 Notify RN    GLUCOSE, CAPILLARY     Status: Abnormal   Collection Time    12/20/13  6:27 PM      Result Value Ref Range   Glucose-Capillary 369 (*) 70 - 99 mg/dL  GLUCOSE, CAPILLARY     Status: Abnormal   Collection Time    12/20/13  8:43 PM      Result Value Ref Range   Glucose-Capillary 279 (*) 70 - 99 mg/dL   Comment 1 Notify RN    GLUCOSE, CAPILLARY      Status: Abnormal   Collection Time    12/21/13  6:29 AM      Result Value Ref Range   Glucose-Capillary 268 (*) 70 - 99 mg/dL    Physical Findings: AIMS: Facial and Oral Movements Muscles of Facial Expression: None, normal Lips and Perioral Area: None, normal Jaw: None, normal Tongue: None, normal,Extremity Movements Upper (arms, wrists, hands, fingers): None, normal Lower (legs, knees, ankles, toes): None, normal, Trunk Movements Neck, shoulders, hips: None, normal, Overall Severity Severity of abnormal movements (highest score from questions above): None, normal Incapacitation due to abnormal movements: None, normal,    CIWA:    COWS:     Treatment Plan Summary: Daily contact with patient to assess and evaluate symptoms and progress in treatment Medication management  Plan: 1. Continue crisis management and stabilization.  2. Medication management: Reviewed with patient who stated no untoward effects. Increase  Abilify to 5 mg daily for psychosis, Increase Zoloft to 100 mg daily for depressive symptoms. Continues Zyprexa Zydis 10 mg every eight hours prn psychosis.  3. Encouraged patient to attend groups and participate in group counseling sessions and activities.  4. Discharge  plan in progress.  5. Continue current treatment plan.  6. Address health issues: Awaiting diabetic recommendations due to significantly elevated blood sugars. Continue Vasotec 20 mg/HCTZ 12.5 mg daily for elevated blood pressure. Start Clonidine 0.1 mg every eight hours prn BP 160/90.   Medical Decision Making Problem Points:  Established problem, worsening (2), Review of last therapy session (1) and Review of psycho-social stressors (1) Data Points:  Review or order clinical lab tests (1) Review or order medicine tests (1) Review of medication regiment & side effects (2) Review of new medications or change in dosage (2)  I certify that inpatient services furnished can reasonably be expected to  improve the patient's condition.   Corena Pilgrim, MD 12/21/2013, 10:57 AM

## 2013-12-21 NOTE — Progress Notes (Signed)
Inpatient Diabetes Program Recommendations  AACE/ADA: New Consensus Statement on Inpatient Glycemic Control (2013)  Target Ranges:  Prepandial:   less than 140 mg/dL      Peak postprandial:   less than 180 mg/dL (1-2 hours)      Critically ill patients:  140 - 180 mg/dL   Reason for Visit: Hyperglycemia Diabetes history: DM2 Outpatient Diabetes medications: 70/30 40 units bid Current orders for Inpatient glycemic control: 70/30 40 units bid and Novolog moderate tidwc and hs ad metformin 1000 mg bid  Results for Justin KeensSTACEY, Benedetto B (MRN [id number]) as of 12/21/2013 11:48  Ref. Range 12/20/2013 06:11 12/20/2013 11:43 12/20/2013 16:45 12/20/2013 18:27 12/20/2013 20:43 12/21/2013 06:29  Glucose-Capillary Latest Range: 70-99 mg/dL 409298 (H) 811334 (H) 914473 (H) 369 (H) 279 (H) 268 (H)   Blood sugars improved this am since starting 70/30 insulin. Still elevated. Per RN, pt may be experiencing hypoglycemic symptoms, even with blood sugars >200 since pt is used to running very high blood sugars. Would benefit from increasing Novolog to resistant and 70/30 to 42 units bid. Gradually titrate until CBGs <200 mg/dL.  Inpatient Diabetes Program Recommendations Insulin - Basal: D/C Lantus. Begin 70/30 40 units bid. Correction (SSI): Continue with Novolog moderate tidwc and hs Insulin - Meal Coverage: D/C Novolog 4 units tidwc Oral Agents: Metformin 1000 mg bid HgbA1C: 13.0% - uncontrolled Diet: CHO mod med - Encourage pt to make healthy choices using portion control in the cafeteria and at snack time.  Note: Will follow daily. Discussed with RN. Thank you. Ailene Ardshonda Joaovictor Krone, RD, LDN, CDE Inpatient Diabetes Coordinator [phone]

## 2013-12-21 NOTE — Progress Notes (Signed)
Adult Psychoeducational Group Note  Date:  12/21/2013 Time:  10:15 PM  Group Topic/Focus:  Wrap-Up Group:   The focus of this group is to help patients review their daily goal of treatment and discuss progress on daily workbooks.  Participation Level:  Minimal  Participation Quality:  Appropriate  Affect:  Flat  Cognitive:  Lacking  Insight: Limited  Engagement in Group:  Limited  Modes of Intervention:  Education, Exploration and Socialization  Additional Comments:  Patient attended and participated in group tonight. He reports that his day was all right once her got focus and took some pills. He advised that he went to two of the groups because he believed that some of the group was not helpful for him, therefore he just walked out. For his recovery he plans to stay on his medication. He has an [facility] scheduled to [facility]. He needs some place to live.  Lita MainsFrancis, Yuriko Portales [facility] 12/21/2013, 10:15 PM
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
## 2013-12-21 NOTE — [facility] Group Notes (Signed)
[facility] Group Notes:  (Nursing/MHT/Case Management/Adjunct)  Date:  12/21/2013  Time:  0900 am  Type of Therapy:  Psychoeducational Skills  Participation Level:  Did Not Attend  [name], [name] 12/21/2013, 10:15 AM

## 2013-12-21 NOTE — [facility] Group Notes (Signed)
[facility] LCSW Group Therapy  12/21/2013 12:28 PM  Type of Therapy:  Group Therapy  Participation Level:  Minimal  Participation Quality:  Drowsy  Affect:  Blunted  Cognitive:  Alert and Oriented  Insight:  Limited  Engagement in Therapy:  Lacking  Modes of Intervention:  Discussion, Exploration, Problem-solving and Support  Summary of Progress/Problems:  Group today consisted of members discussing feelings around their diagnosis.  Group discussed negative, positive, stigmas and feelings about labels and how they associate with mental health.   [name] attended all of group and attempted to stay awake as best he could. He did not participate in the discussion, but was attentive and respectful to others who were processing.  Limited information provided by [name].  [name], [name] 12/21/2013, 12:28 PM

## 2013-12-22 LAB — URIC ACID: Uric Acid, Serum: 12.1 mg/dL — ABNORMAL HIGH (ref 4.0–7.8)

## 2013-12-22 LAB — GLUCOSE, CAPILLARY
GLUCOSE-CAPILLARY: 211 mg/dL — AB (ref 70–99)
GLUCOSE-CAPILLARY: 142 mg/dL — AB (ref 70–99)
Glucose-Capillary: 275 mg/dL — ABNORMAL HIGH (ref 70–99)
Glucose-Capillary: 249 mg/dL — ABNORMAL HIGH (ref 70–99)

## 2013-12-22 LAB — SEDIMENTATION RATE: SED RATE: 21 mm/h — AB (ref 0–16)

## 2013-12-22 MED ORDER — ALLOPURINOL 100 MG PO TABS
100.0000 mg | ORAL_TABLET | Freq: Every day | ORAL | Status: DC
  Administered 2013-12-22 – 2013-12-27 (×6): 100 mg via ORAL
  Filled 2013-12-22 (×2): qty 1
  Filled 2013-12-22 (×2): qty 14
  Filled 2013-12-22 (×5): qty 1

## 2013-12-22 MED ORDER — COLCHICINE 0.6 MG PO TABS
0.6000 mg | ORAL_TABLET | Freq: Two times a day (BID) | ORAL | Status: DC
  Administered 2013-12-22 – 2013-12-27 (×12): 0.6 mg via ORAL
  Filled 2013-12-22: qty 1
  Filled 2013-12-22 (×2): qty 25
  Filled 2013-12-22 (×3): qty 1
  Filled 2013-12-22: qty 25
  Filled 2013-12-22 (×8): qty 1
  Filled 2013-12-22: qty 25
  Filled 2013-12-22 (×2): qty 1
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
## 2013-12-22 NOTE — [facility] Group Notes (Signed)
[facility]   12/22/2013 10:47 AM  Participation Quality:  Resistant  Mood/Affect:  Flat  Depression Rating:    Anxiety Rating:    Thoughts of Suicide:  No Will you contract for safety?   Yes  Current AVH:  Yes  Voices of his father and seeing his father  Plan for Discharge/Comments:  Justin NajjarLarry reports he does not want to discuss this information in front of the group.  Will meet with patient 1:1   Transportation Means: Will assess  Supports:  Will assess  Justin SorrowCoble, [name]

## 2013-12-22 NOTE — Progress Notes (Signed)
Patient ID: Justin KeensLarry B Delacruz, male   DOB: 12/17/1955, 58 y.o.   MRN: [id number] D: patient continues to have auditory hallucinations.  He is hearing his father's voice.  He denies any SI/HI.  Patient has been attending groups with minimal participation.  He rates his depression as an 8; hopelessness as a 9.  Patient wrote on his inventory that he would like to "get a place to live, work part-time and draw my disability.  I plan to go to meetings and worship service."  Patient has been cooperative and is interacting well with staff and others.  A: continue to monitor medication management and MD orders.  Safety checks completed every 15 minutes per protocol.  R: patient has been receptive to staff; his behavior is appropriate.

## 2013-12-22 NOTE — Progress Notes (Signed)
The focus of this group is to help patients review their daily goal of treatment and discuss progress on daily workbooks. Pt attended the evening group and responded to all discussion prompts from the Writer. Pt shared that today was a good day on the unit due to his realizing several important things such as accepting other people and their opinions rather than always running away. Pt's only additional need from Nursing Staff this evening was to have his pencils sharpened, which was handled following group. Pt's affect was appropriate and he contributed positive comments to his peers in the wrap-up.
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
## 2013-12-22 NOTE — [facility] Group Notes (Signed)
[facility]  12/22/2013  12:46 PM  Type of Therapy:  [employer] Presentation   Participation Level:  Minimal  Participation Quality:  Drowsy  Affect:  Appropriate  Cognitive:  Appropriate  Insight:  Engaged  Engagement in Therapy:  Engaged  Modes of Intervention:  Discussion, Education and Socialization   Summary of Progress/Problems:  Onalee HuaDavid from [employer] came to present his recovery story and play the guitar.  [name] slept while the speaker told his story.   He nodded his head while the speaker played his music.  [name] stated that he enjoyed the music and thanked the speaker for playing his music.    [name]   12/22/2013  12:46 PM

## 2013-12-23 LAB — GLUCOSE, CAPILLARY
GLUCOSE-CAPILLARY: 323 mg/dL — AB (ref 70–99)
GLUCOSE-CAPILLARY: 263 mg/dL — AB (ref 70–99)
Glucose-Capillary: 170 mg/dL — ABNORMAL HIGH (ref 70–99)
Glucose-Capillary: 131 mg/dL — ABNORMAL HIGH (ref 70–99)

## 2013-12-23 MED ORDER — SERTRALINE HCL 50 MG PO TABS
150.0000 mg | ORAL_TABLET | Freq: Every day | ORAL | Status: AC
  Administered 2013-12-24 – 2013-12-25 (×2): 150 mg via ORAL
  Filled 2013-12-23 (×2): qty 1

## 2013-12-23 MED ORDER — TRAZODONE HCL 50 MG PO TABS
25.0000 mg | ORAL_TABLET | Freq: Every day | ORAL | Status: DC
  Administered 2013-12-23 – 2013-12-27 (×5): 25 mg via ORAL
  Filled 2013-12-23 (×3): qty 1
  Filled 2013-12-23: qty 7
  Filled 2013-12-23: qty 1
  Filled 2013-12-23: qty 7
  Filled 2013-12-23: qty 1

## 2013-12-23 MED ORDER — ARIPIPRAZOLE 15 MG PO TABS
7.5000 mg | ORAL_TABLET | Freq: Every day | ORAL | Status: DC
  Administered 2013-12-24 – 2013-12-27 (×4): 7.5 mg via ORAL
  Filled 2013-12-23: qty 7
  Filled 2013-12-23: qty 1
  Filled 2013-12-23: qty 7
  Filled 2013-12-23 (×3): qty 1

## 2013-12-23 NOTE — [facility] Group Notes (Signed)
[facility] Group Notes:  (Nursing/MHT/Case Management/Adjunct)  Date:  12/23/2013 Time:  1:56 PM  Type of Therapy:  Group Therapy   Participation Level:  Active  Participation Quality:  Appropriate  Affect:  Appropriate  Cognitive:  Appropriate  Insight:  Appropriate  Engagement in Group:  Engaged  Modes of Intervention: Discussion, Exploration and Socialization   Summary of Progress/Problems:  The topic for group was different personality that one will have to deal with others. These personality types are assertive, submissive, and aggressive.  Pt defines each personality types.   Pt participated in the discussion about which personality types they tend to be more comfortable using. Pt discussed ways to deal with others and where they can work on to get where they want to be.  [name] was attentive and engaged throughout today's therapy group.  He defined submissive as "humble."  He stated that he could not identify with what assertive, submissive, and aggressive meant, but relied on being humble to deal with others.  He was taught how to be humble from his grandfather.  He stated that his reactions to any situation depends on who is involved and the consequences.  [name] shows progress in the group setting and improving insight AEB his ability to process what members were discussing, contributing to group, and sharing his own experiences.    Simona Huhina Samayah Novinger MSW Intern  12/23/2013 1:56 PM

## 2013-12-23 NOTE — Progress Notes (Signed)
Adult Psychoeducational Group Note  Date:  12/23/2013 Time:  11:02 AM  Group Topic/Focus:  Overcoming Stress:   The focus of this group is to define stress and help patients assess their triggers.   Participation Level:  Active  Participation Quality:  Appropriate, Attentive and Sharing  Affect:  Appropriate  Cognitive:  Alert, Appropriate and Oriented  Insight: Appropriate and Good  Engagement in Group:  Engaged and Supportive  Modes of Intervention:  Discussion, Education, Socialization and Support  Additional Comments:  Pts talked about positive and negative ways they express themselves in stressful situations.  Pts also discussed that stress can be negative as well as positive depending on how the stress is handled.  Pt shared that he prefers to step away from stressful situations and come back to them after analyzing the situation so that he may be more comfortable.  Berlin Hunuttle, Lezlee Gills M 12/23/2013, 11:02 AM

## 2013-12-23 NOTE — Progress Notes (Signed)
Patient ID: [name], male   DOB: 1956-05-07, 58 y.o.   MRN: [id number] [facility] MD Progress Note  12/23/2013 9:44 AM LAVAL CAFARO  MRN:  [id number] Subjective:   Patient states "I am still hearing the voice of my dead father saying bad stuffs to me. The voice tells me I am no good, I should just hurt myself  But I am not going to listen to the voice, I am going to hold on to the lord and do what is right for me.'' Objective:  Patient reports decreased anxiety, mood swings and depressive symptoms, rated his depression and anxiety at 6/10. He endorsed ongoing racing thoughts, excessive worries and auditory hallucinations. Patient denies visual hallucinations, suicidal ideation, intent or plan. He also denies cravings for drugs and alcohol but endorsing intermittent homicidal thoughts to no one in particular. Patient reports that he does not feel safe to get back to the community and he is requesting for his medications to be adjusted to help resolving his symptoms. He has been visible and complying with the unit milieu. Patient is also compliant with his medications and did not endorsed any adverse effects. Diagnosis:   DSM5: Schizophrenia Disorders:  Schizophrenia (295.7) Obsessive-Compulsive Disorders:   Trauma-Stressor Disorders:   Substance/Addictive Disorders:   Depressive Disorders:   Total Time spent with patient: 30 minutes  Axis I: Schizophrenia Unspecified, Cocaine abuse             Depressive disorder, NOS Axis II: Deferred Axis III:  Past Medical History  Diagnosis Date  . Diabetes mellitus   . Hypertension   . Gout   . MI (myocardial infarction)   . Chronic pain     right elbow   Axis IV: economic problems, housing problems, occupational problems, other psychosocial or environmental problems and problems with primary support group Axis V: 31-40 impairment in reality testing  ADL's:  Intact  Sleep: Fair  Appetite:  Fair  Suicidal Ideation:  Passive SI no plan   Homicidal Ideation:  Denies  AEB (as evidenced by):  Psychiatric Specialty Exam: Physical Exam  Psychiatric: His speech is normal. His mood appears anxious. He is agitated and actively hallucinating. Cognition and memory are normal. He expresses impulsivity. He exhibits a depressed mood. He expresses homicidal ideation.    Review of Systems  Constitutional: Negative.   HENT: Negative.   Eyes: Negative.   Respiratory: Negative.   Cardiovascular: Negative.   Gastrointestinal: Negative.   Genitourinary: Negative.   Musculoskeletal: Negative.   Skin: Negative.   Neurological: Negative.   Endo/Heme/Allergies: Negative.   Psychiatric/Behavioral: Positive for hallucinations and substance abuse. Negative for memory loss. The patient is nervous/anxious and has insomnia.     Blood pressure 143/84, pulse 96, temperature 97.6 F (36.4 C), temperature source Oral, resp. rate 20, height 5\' 4"  (1.626 m), weight 80 kg (176 lb 5.9 oz), SpO2 99.00%.Body mass index is 30.26 kg/(m^2).  General Appearance: fairly groomed  Patent attorney::  Fair  Speech:  Clear and Coherent  Volume:  Decreased  Mood:  Dysphoric and Irritable  Affect:  Blunt  Thought Process:  Circumstantial  Orientation:  Full (Time, Place, and Person)  Thought Content:  Hallucinations: Auditory   Suicidal Thoughts:  denies  Homicidal Thoughts: Yes  Memory:  Immediate;   Good Recent;   Good Remote;   Good  Judgement:  Impaired  Insight:  Shallow  Psychomotor Activity:  Decreased  Concentration:  Fair  Recall:  Good  Fund of Knowledge:Fair  Language:  Good  Akathisia:  No  Handed:  Right  AIMS (if indicated):     Assets:  Communication Skills Desire for Improvement Leisure Time Physical Health Resilience Social Support  Sleep:  Number of Hours: 6.25   Musculoskeletal: Strength & Muscle Tone: within normal limits Gait & Station: normal Patient leans: N/A  Current Medications: Current Facility-Administered  Medications  Medication Dose Route Frequency Provider Last Rate Last Dose  . acetaminophen (TYLENOL) tablet 650 mg  650 mg Oral Q6H PRN Court Joyharles E Kober, PA-C      . allopurinol (ZYLOPRIM) tablet 100 mg  100 mg Oral Daily Kerry HoughSpencer E Simon, PA-C   100 mg at 12/23/13 0755  . alum & mag hydroxide-simeth (MAALOX/MYLANTA) 200-200-20 MG/5ML suspension 30 mL  30 mL Oral Q4H PRN Earney NavyJosephine C Onuoha, NP      . [START ON 12/24/2013] ARIPiprazole (ABILIFY) tablet 7.5 mg  7.5 mg Oral Q2200 Roosevelt Bisher      . cloNIDine (CATAPRES) tablet 0.1 mg  0.1 mg Oral Q8H PRN Fransisca KaufmannLaura Davis, NP      . colchicine tablet 0.6 mg  0.6 mg Oral BID Kerry HoughSpencer E Simon, PA-C   0.6 mg at 12/23/13 0755  . enalapril (VASOTEC) tablet 20 mg  20 mg Oral Daily Court Joyharles E Kober, PA-C   20 mg at 12/23/13 0754  . hydrochlorothiazide (MICROZIDE) capsule 12.5 mg  12.5 mg Oral Daily Court Joyharles E Kober, PA-C   12.5 mg at 12/23/13 0754  . ibuprofen (ADVIL,MOTRIN) tablet 600 mg  600 mg Oral Q8H PRN Earney NavyJosephine C Onuoha, NP      . insulin aspart (novoLOG) injection 0-20 Units  0-20 Units Subcutaneous TID WC Fransisca KaufmannLaura Davis, NP   4 Units at 12/23/13 0640  . insulin aspart (novoLOG) injection 0-5 Units  0-5 Units Subcutaneous QHS Fransisca KaufmannLaura Davis, NP      . insulin aspart protamine- aspart (NOVOLOG MIX 70/30) injection 42 Units  42 Units Subcutaneous BID WC Fransisca KaufmannLaura Davis, NP   42 Units at 12/23/13 0757  . LORazepam (ATIVAN) tablet 1 mg  1 mg Oral Q8H PRN Earney NavyJosephine C Onuoha, NP   1 mg at 12/20/13 1430  . magnesium hydroxide (MILK OF MAGNESIA) suspension 30 mL  30 mL Oral Daily PRN Court Joyharles E Kober, PA-C      . metFORMIN (GLUCOPHAGE) tablet 1,000 mg  1,000 mg Oral BID WC Earney NavyJosephine C Onuoha, NP   1,000 mg at 12/23/13 0754  . OLANZapine zydis (ZYPREXA) disintegrating tablet 10 mg  10 mg Oral Q8H PRN Fransisca KaufmannLaura Davis, NP   10 mg at 12/20/13 2127  . [START ON 12/24/2013] sertraline (ZOLOFT) tablet 150 mg  150 mg Oral Daily Tinya Cadogan      . traZODone (DESYREL) tablet 25 mg  25 mg  Oral QHS Nykayla Marcelli        Lab Results:  Results for orders placed during the hospital encounter of 12/18/13 (from the past 48 hour(s))  GLUCOSE, CAPILLARY     Status: Abnormal   Collection Time    12/21/13 11:56 AM      Result Value Ref Range   Glucose-Capillary 287 (*) 70 - 99 mg/dL  GLUCOSE, CAPILLARY     Status: Abnormal   Collection Time    12/21/13  4:58 PM      Result Value Ref Range   Glucose-Capillary 317 (*) 70 - 99 mg/dL  GLUCOSE, CAPILLARY     Status: Abnormal   Collection Time    12/21/13  9:38 PM  Result Value Ref Range   Glucose-Capillary 144 (*) 70 - 99 mg/dL   Comment 1 Notify RN    GLUCOSE, CAPILLARY     Status: Abnormal   Collection Time    12/22/13  6:09 AM      Result Value Ref Range   Glucose-Capillary 211 (*) 70 - 99 mg/dL   Comment 1 Notify RN    GLUCOSE, CAPILLARY     Status: Abnormal   Collection Time    12/22/13 11:58 AM      Result Value Ref Range   Glucose-Capillary 275 (*) 70 - 99 mg/dL  GLUCOSE, CAPILLARY     Status: Abnormal   Collection Time    12/22/13  4:59 PM      Result Value Ref Range   Glucose-Capillary 249 (*) 70 - 99 mg/dL  URIC ACID     Status: Abnormal   Collection Time    12/22/13  7:57 PM      Result Value Ref Range   Uric Acid, Serum 12.1 (*) 4.0 - 7.8 mg/dL   Comment: Performed at [facility]  SEDIMENTATION RATE     Status: Abnormal   Collection Time    12/22/13  7:57 PM      Result Value Ref Range   Sed Rate 21 (*) 0 - 16 mm/hr   Comment: Performed at [facility]  GLUCOSE, CAPILLARY     Status: Abnormal   Collection Time    12/22/13  8:40 PM      Result Value Ref Range   Glucose-Capillary 142 (*) 70 - 99 mg/dL   Comment 1 Notify RN    GLUCOSE, CAPILLARY     Status: Abnormal   Collection Time    12/23/13  6:17 AM      Result Value Ref Range   Glucose-Capillary 170 (*) 70 - 99 mg/dL    Physical Findings: AIMS: Facial and Oral Movements Muscles of Facial  Expression: None, normal Lips and Perioral Area: None, normal Jaw: None, normal Tongue: None, normal,Extremity Movements Upper (arms, wrists, hands, fingers): None, normal Lower (legs, knees, ankles, toes): None, normal, Trunk Movements Neck, shoulders, hips: None, normal, Overall Severity Severity of abnormal movements (highest score from questions above): None, normal Incapacitation due to abnormal movements: None, normal Patient's awareness of abnormal movements (rate only patient's report): No Awareness, Dental Status Current problems with teeth and/or dentures?: Yes Does patient usually wear dentures?: No  CIWA:    COWS:     Treatment Plan Summary: Daily contact with patient to assess and evaluate symptoms and progress in treatment Medication management  Plan: 1. Continue crisis management and stabilization.  2. Medication management: Reviewed with patient who stated no untoward effects. - Increase  Abilify to 7.5 mg daily for psychosis. - Increase Zoloft to 150 mg daily for depressive symptoms.  -Continues Zyprexa Zydis 10 mg every eight hours prn psychosis.  3. Encouraged patient to attend groups and participate in group counseling sessions and activities.  4. Discharge plan in progress.  5. Continue current treatment plan.  6. Address health issues: Awaiting diabetic recommendations due to significantly elevated blood sugars. Continue Vasotec 20 mg/HCTZ 12.5 mg daily for elevated blood pressure. Start Clonidine 0.1 mg every eight hours prn BP 160/90.   Medical Decision Making Problem Points:  Established problem, improving (2), Review of last therapy session (1) and Review of psycho-social stressors (1) Data Points:  Review or order clinical lab tests (1) Review or order medicine tests (  1) Review of medication regiment & side effects (2) Review of new medications or change in dosage (2)  I certify that inpatient services furnished can reasonably be expected to improve the  patient's condition.   Thedore Mins, MD 12/23/2013, 9:44 AM

## 2013-12-23 NOTE — Progress Notes (Signed)
Adult Psychoeducational Group Note  Date:  12/23/2013 Time:  10:45 PM  Group Topic/Focus:  Goals Group:   The focus of this group is to help patients establish daily goals to achieve during treatment and discuss how the patient can incorporate goal setting into their daily lives to aide in recovery.  Participation Level:  Active  Participation Quality:  Appropriate  Affect:  Appropriate  Cognitive:  Appropriate  Insight: Appropriate  Engagement in Group:  Engaged  Modes of Intervention:  Discussion  Additional Comments:  Pt stated that he had a good day , got very good new that he was going to have some placement set up for him when He leaves.   Terie PurserParker, Alyssia Heese R 12/23/2013, 10:45 PM

## 2013-12-23 NOTE — Progress Notes (Signed)
Patient ID: Justin KeensLarry B Delacruz, male   DOB: 06/05/1956, 58 y.o.   MRN: [id number] D: pt. Visible on the unit in the dayroom drawing reports of the AH, "the more the talk the more I keep pushing this pencil, not going to let it work me" "been a wonderful day" "suppose to leave Tuesday" A: Writer provided emotional support, encouraged client to continue to watch meals to keep BS under control. Pt. Encouraged to attend group. Staff will monitor q3515min for safety. R: Pt. Is safe on the unit. Pt. Attended group.
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
## 2013-12-23 NOTE — Progress Notes (Signed)
Patient ID: [name], male   DOB: 01/17/1956, 58 y.o.   MRN: [id number] D: pt. In dayroom drawing, reports "I got some hope today, my counselor she gave hope" "things gonna be a lot better, looking towards what she was saying" "trying to hold on to Jesus" "Christ suffered so what you think about us" "my grandfather told me a long time ago you gonna have go through some things" A: Writer provided emotional support encouraged client to continue with positive affirmations and continue to move forward. Staff will monitor q9115min for safety. R: pt. Is safe on the unit, pt. Attended group.
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
## 2013-12-23 NOTE — Progress Notes (Signed)
Recreation Therapy Notes  Date: 03.05.2015 Time: 11:30am Duration: 20  minutes Location: [address] Consult Room   Group Topic: 1:1  Recreation Therapy Goal Addressed:  Through the use of guided imagery patient will learn coping skill to use as alternate to drawing during admission.   Patient progressing towards goal? yes  Behavioral Response: Engaged, Appropriate   Education: Dischage Planning   Education Outcome: Acknowledges understanding  Clinical Observations/Feedback: Patient seated in dayroom when LRT entered unit, drawing. LRT approached LRT, upon approached patient smiled brightly and stated he was drawing something that he envisioned while practicing guided imagery technique learned during previous recreation therapy 1:1 session. LRT offered patient opportunity to talk to her 1:1 and continue practice with guided imagery. Patient receptive to this and joined LRT in consult room. Patient stated what he was drawing when LRT entered unit was inspired by a poem he wrote, patient stated he wrote the poem over the course of 6 years. LRT allowed patient to recite poem for her, poem was about emotions stranded on an [place] and the only thing able to help love off the deserted [place] was time, as that is the only thing that love can evolve from. Patient identified components of drawing that coinsided with his poem. Patient discussed with LRT his feeling regarding guided imagery and identified he feels it is a helpful technique. Patient was then asked to close his eyes and envision himself drawing, patient was instructed to start with the chair he is sitting in and the surface he is drawing on. LRT allowed patient to describe in specific detail what the room looked like and what he was drawing in. Patient able to do this with no issues. Patient smiled while envisioning this. Patient was then asked to identify what he would like to draw. Patient described a waterfall, with a rainbow. Patient  inserted a couple having their picture taken into his drawing and was able to describe scene to LRT complete with specific details about color and placement of scenery.   Patient identified feelings of calm and peaceful while participating in this activity. Patient body posture transitioned from sitting straight up to relaxing and sitting back in chair as he described the what he saw in his head. Patient again asked if he could take what he pictured in his head and draw if he has pencil and paper available. LRT encouraged this. LRT reiterated importance of using this technique when he is unable to draw or does not have appropriate supplies.    Patient verbalized understanding of ability to use technique post d/c. Patient instructed to continue to practice on his own. LRT will continue to work with patient during admission.   [name], LRT/CTRS  [name] 12/23/2013 7:22 PM

## 2013-12-23 NOTE — Progress Notes (Signed)
[name] is agreeable to referral for Group Home. [facility] in [place] has called back with three beds open and asking for more information. LCSW completed FL2 (as required) and also [facility] in which [facility]. [facility] and they will [facility] of Friday at 10am to meet patient and see if he is a good fit.  [name] will also follow up with [facility] and outpatient along with [facility].  Patient will be updated regarding status of appointment tomorrow and referral.  Ashley JacobsHannah Nail, MSW, LCSW Clinical Lead [phone]

## 2013-12-24 LAB — GLUCOSE, CAPILLARY
GLUCOSE-CAPILLARY: 187 mg/dL — AB (ref 70–99)
GLUCOSE-CAPILLARY: 76 mg/dL (ref 70–99)
Glucose-Capillary: 188 mg/dL — ABNORMAL HIGH (ref 70–99)
Glucose-Capillary: 138 mg/dL — ABNORMAL HIGH (ref 70–99)

## 2013-12-24 NOTE — [facility] Group Notes (Signed)
[facility] Planning [facility] Note   12/24/2013 10:06 AM  Participation Quality:  Active   Mood/Affect:  Flat  Depression Rating:  5  Anxiety Rating:  4  Thoughts of Suicide:  No Will you contract for safety?   Yes  Current AVH:  Yes  Voices of his father and seeing his father  Plan for Discharge/Comments:  Justin NajjarLarry indicated that he is sleeping well and feels good today.   Justin NajjarLarry was informed about a potential housing placement with [facility]; however, after talking to the CSW yesterday and a [facility], Justin [facility]aj[facility]Larry [facility] him after D/C.  Will call [facility] today for an intake appointment.  Likely discharge to be on Monday.    Transportation Means: None indicated   Supports:  None indicated   Simona HuhYang, Lilyrose Tanney

## 2013-12-24 NOTE — Progress Notes (Signed)
Adult Psychoeducational Group Note  Date:  12/24/2013 Time:  8:00 pm  Group Topic/Focus:  Wrap-Up Group:   The focus of this group is to help patients review their daily goal of treatment and discuss progress on daily workbooks.  Participation Level:  Active  Participation Quality:  Appropriate and Sharing  Affect:  Appropriate  Cognitive:  Appropriate  Insight: Appropriate  Engagement in Group:  Engaged  Modes of Intervention:  Discussion, Education, Socialization and Support  Additional Comments:  Pt stated that he had a great day and that he was thankful that the Lord woke him up this morning. Pt said that he is thankful for the staff and nurses that he has been able to work with. Pt said that his goal is to find residency and to continue taking his medication. Pt stated that his faith in [place] helps him to cope with his mental illness.   Laural BenesJohnson, Daneka Lantigua 12/24/2013, 8:47 PM
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
## 2013-12-24 NOTE — Progress Notes (Signed)
Patient ID: [name], male   DOB: Jul 20, 1956, 58 y.o.   MRN: [id number] [facility] MD Progress Note  12/24/2013 11:07 AM [name]  MRN:  [id number] Subjective:   Patient states "I'm not hearing my father's voice as much. But it still makes nasty comments such as you are a stupid idiot. My depression has improved since I'm planning out a solution to my housing problems."  Objective:  Patient reports decreased anxiety, mood swings and depressive symptoms, rated his depression and anxiety at 4/10. He endorsed ongoing racing thoughts, excessive worries and auditory hallucinations. Patient denies visual hallucinations, suicidal ideation, intent or plan. Rasaan feels as though his symptoms have improved steadily since his medications were recently adjusted. Patient observed drawing in the dayroom with peers. He showed Clinical research associate his drawings of a lion reporting they inspire him to be stronger in having the will to keep trying to cope with his psychiatric symptoms. Berley appears invested in his treatment and is attending groups. Nursing staff report that the patient is having conflict with his roommate and is pleasantly requesting a room change. Patient is worried that he will act out if the situation does not change soon.   Diagnosis:   DSM5: Schizophrenia Disorders:  Schizophrenia (295.7) Obsessive-Compulsive Disorders:   Trauma-Stressor Disorders:   Substance/Addictive Disorders:   Depressive Disorders:   Total Time spent with patient: 30 minutes  Axis I: Schizophrenia Unspecified, Cocaine abuse             Depressive disorder, NOS Axis II: Deferred Axis III:  Past Medical History  Diagnosis Date  . Diabetes mellitus   . Hypertension   . Gout   . MI (myocardial infarction)   . Chronic pain     right elbow   Axis IV: economic problems, housing problems, occupational problems, other psychosocial or environmental problems and problems with primary support group Axis V: 41-50 serious  symptoms  ADL's:  Intact  Sleep: Good  Appetite:  Fair  Suicidal Ideation:  Passive SI no plan  Homicidal Ideation:  Denies  AEB (as evidenced by):  Psychiatric Specialty Exam: Physical Exam  Psychiatric: His speech is normal. His mood appears anxious. He is agitated and actively hallucinating. Cognition and memory are normal. He expresses impulsivity. He exhibits a depressed mood. He expresses homicidal ideation.    Review of Systems  Constitutional: Negative.   HENT: Negative.   Eyes: Negative.   Respiratory: Negative.   Cardiovascular: Negative.   Gastrointestinal: Negative.   Genitourinary: Negative.   Musculoskeletal: Negative.   Skin: Negative.   Neurological: Negative.   Endo/Heme/Allergies: Negative.   Psychiatric/Behavioral: Positive for depression, hallucinations and substance abuse. Negative for suicidal ideas and memory loss. The patient is nervous/anxious and has insomnia.     Blood pressure 131/80, pulse 100, temperature 97.8 F (36.6 C), temperature source Oral, resp. rate 18, height 5\' 4"  (1.626 m), weight 80 kg (176 lb 5.9 oz), SpO2 99.00%.Body mass index is 30.26 kg/(m^2).  General Appearance: fairly groomed  Patent attorney::  Fair  Speech:  Clear and Coherent  Volume:  Decreased  Mood:  Anxious and Irritable  Affect:  Blunt  Thought Process:  Circumstantial  Orientation:  Full (Time, Place, and Person)  Thought Content:  Hallucinations: Auditory   Suicidal Thoughts:  denies  Homicidal Thoughts: Yes  Memory:  Immediate;   Good Recent;   Good Remote;   Good  Judgement:  Impaired  Insight:  Shallow  Psychomotor Activity:  Decreased  Concentration:  Fair  Recall:  [name] of Knowledge:Fair  Language: Good  Akathisia:  No  Handed:  Right  AIMS (if indicated):     Assets:  Communication Skills Desire for Improvement Leisure Time Physical Health Resilience Social Support  Sleep:  Number of Hours: 6   Musculoskeletal: Strength & Muscle  Tone: within normal limits Gait & Station: normal Patient leans: N/A  Current Medications: Current Facility-Administered Medications  Medication Dose Route Frequency Provider Last Rate Last Dose  . acetaminophen (TYLENOL) tablet 650 mg  650 mg Oral Q6H PRN Court Joy, PA-C      . allopurinol (ZYLOPRIM) tablet 100 mg  100 mg Oral Daily Kerry Hough, PA-C   100 mg at 12/24/13 0801  . alum & mag hydroxide-simeth (MAALOX/MYLANTA) 200-200-20 MG/5ML suspension 30 mL  30 mL Oral Q4H PRN Earney Navy, NP      . ARIPiprazole (ABILIFY) tablet 7.5 mg  7.5 mg Oral Q2200 Simrat Kendrick      . cloNIDine (CATAPRES) tablet 0.1 mg  0.1 mg Oral Q8H PRN Fransisca Kaufmann, NP      . colchicine tablet 0.6 mg  0.6 mg Oral BID Kerry Hough, PA-C   0.6 mg at 12/24/13 0801  . enalapril (VASOTEC) tablet 20 mg  20 mg Oral Daily Court Joy, PA-C   20 mg at 12/24/13 0800  . hydrochlorothiazide (MICROZIDE) capsule 12.5 mg  12.5 mg Oral Daily Court Joy, PA-C   12.5 mg at 12/24/13 0801  . ibuprofen (ADVIL,MOTRIN) tablet 600 mg  600 mg Oral Q8H PRN Earney Navy, NP      . insulin aspart (novoLOG) injection 0-20 Units  0-20 Units Subcutaneous TID WC Fransisca Kaufmann, NP   4 Units at 12/24/13 [phone]  . insulin aspart (novoLOG) injection 0-5 Units  0-5 Units Subcutaneous QHS Fransisca Kaufmann, NP      . insulin aspart protamine- aspart (NOVOLOG MIX 70/30) injection 42 Units  42 Units Subcutaneous BID WC Fransisca Kaufmann, NP   42 Units at 12/24/13 0801  . LORazepam (ATIVAN) tablet 1 mg  1 mg Oral Q8H PRN Earney Navy, NP   1 mg at 12/20/13 1430  . magnesium hydroxide (MILK OF MAGNESIA) suspension 30 mL  30 mL Oral Daily PRN Court Joy, PA-C      . metFORMIN (GLUCOPHAGE) tablet 1,000 mg  1,000 mg Oral BID WC Earney Navy, NP   1,000 mg at 12/24/13 0801  . OLANZapine zydis (ZYPREXA) disintegrating tablet 10 mg  10 mg Oral Q8H PRN Fransisca Kaufmann, NP   10 mg at 12/20/13 2127  . sertraline (ZOLOFT) tablet 150  mg  150 mg Oral Daily France Noyce   150 mg at 12/24/13 0801  . traZODone (DESYREL) tablet 25 mg  25 mg Oral QHS Prabhjot Piscitello   25 mg at 12/23/13 2213    Lab Results:  Results for orders placed during the hospital encounter of 12/18/13 (from the past 48 hour(s))  GLUCOSE, CAPILLARY     Status: Abnormal   Collection Time    12/22/13 11:58 AM      Result Value Ref Range   Glucose-Capillary 275 (*) 70 - 99 mg/dL  GLUCOSE, CAPILLARY     Status: Abnormal   Collection Time    12/22/13  4:59 PM      Result Value Ref Range   Glucose-Capillary 249 (*) 70 - 99 mg/dL  URIC ACID     Status: Abnormal   Collection Time  12/22/13  7:57 PM      Result Value Ref Range   Uric Acid, Serum 12.1 (*) 4.0 - 7.8 mg/dL   Comment: Performed at [facility]  SEDIMENTATION RATE     Status: Abnormal   Collection Time    12/22/13  7:57 PM      Result Value Ref Range   Sed Rate 21 (*) 0 - 16 mm/hr   Comment: Performed at [facility]  GLUCOSE, CAPILLARY     Status: Abnormal   Collection Time    12/22/13  8:40 PM      Result Value Ref Range   Glucose-Capillary 142 (*) 70 - 99 mg/dL   Comment 1 Notify RN    GLUCOSE, CAPILLARY     Status: Abnormal   Collection Time    12/23/13  6:17 AM      Result Value Ref Range   Glucose-Capillary 170 (*) 70 - 99 mg/dL  GLUCOSE, CAPILLARY     Status: Abnormal   Collection Time    12/23/13 11:26 AM      Result Value Ref Range   Glucose-Capillary 323 (*) 70 - 99 mg/dL  GLUCOSE, CAPILLARY     Status: Abnormal   Collection Time    12/23/13  5:09 PM      Result Value Ref Range   Glucose-Capillary 263 (*) 70 - 99 mg/dL   Comment 1 Documented in Chart     Comment 2 Notify RN    GLUCOSE, CAPILLARY     Status: Abnormal   Collection Time    12/23/13  8:42 PM      Result Value Ref Range   Glucose-Capillary 131 (*) 70 - 99 mg/dL  GLUCOSE, CAPILLARY     Status: Abnormal   Collection Time    12/24/13  6:27 AM      Result Value  Ref Range   Glucose-Capillary 187 (*) 70 - 99 mg/dL    Physical Findings: AIMS: Facial and Oral Movements Muscles of Facial Expression: None, normal Lips and Perioral Area: None, normal Jaw: None, normal Tongue: None, normal,Extremity Movements Upper (arms, wrists, hands, fingers): None, normal Lower (legs, knees, ankles, toes): None, normal, Trunk Movements Neck, shoulders, hips: None, normal, Overall Severity Severity of abnormal movements (highest score from questions above): None, normal Incapacitation due to abnormal movements: None, normal Patient's awareness of abnormal movements (rate only patient's report): No Awareness, Dental Status Current problems with teeth and/or dentures?: Yes Does patient usually wear dentures?: No  CIWA:    COWS:     Treatment Plan Summary: Daily contact with patient to assess and evaluate symptoms and progress in treatment Medication management  Plan: 1. Continue crisis management and stabilization.  2. Medication management: Reviewed with patient who stated no untoward effects. - Continue Abilify to 7.5 mg daily for psychosis. - Continue Zoloft to 150 mg daily for depressive symptoms.  -Continue Zyprexa Zydis 10 mg every eight hours prn psychosis.  3. Encouraged patient to attend groups and participate in group counseling sessions and activities.  4. Discharge plan in progress. Anticipate d/c Monday. Social worker trying to secure treatment at [facility].  5. Continue current treatment plan.  6. Address health issues:  Continue Vasotec 20 mg/HCTZ 12.5 mg daily for elevated blood pressure. Monitoring CBG which are showing stabilization after 70/30 was increased to 42 units twice daily.   Medical Decision Making Problem Points:  Established problem, improving (2), Review of last therapy session (1) and Review of psycho-social  stressors (1) Data Points:  Review or order clinical lab tests (1) Review or order medicine tests (1) Review of  medication regiment & side effects (2) Review of new medications or change in dosage (2)  I certify that inpatient services furnished can reasonably be expected to improve the patient's condition.   Fransisca KaufmannDAVIS, LAURA, NP-C 12/24/2013, 11:07 AM  Patient seen, evaluated and I agree with notes by Nurse Practitioner. Thedore MinsMojeed Aryel Edelen, MD

## 2013-12-24 NOTE — [facility] Suicide Risk Assessment (Signed)
[facility] INPATIENT:  Family/Significant Other Suicide Prevention Education  Suicide Prevention Education:  Patient Refusal for Family/Significant Other Suicide Prevention Education: The patient [name] has refused to provide written consent for family/significant other to be provided Family/Significant Other Suicide Prevention Education during admission and/or prior to discharge.  Physician notified.  [name], Jodie Leiner N 12/24/2013, 12:55 PM

## 2013-12-24 NOTE — Progress Notes (Addendum)
3:30pm: [name]. He has been coached on what he needs to do and to call the day before and verify bed. This will need to be done on Monday. [name] has worked hard identifying an ID as he has been in prison without any ID.  He has no family or supports at this time and has refused contact with any. LCSW has printed off a packet for [name] in efforts to help show proof of his address and [facility] and [facility]  [name] has all copies. Review of [name] will be on Monday with possible DC or if MD feels necessary hold until Tuesday morning for admission. LCSW has also called shelter in [place], but no return call regarding bed status.      [name] has decided to go into substance abuse treatment at [facility] and [facility]. Group Home reports patient is not an appropriate referral for their house and denied referral. [name] was made aware and agreeable. [name] has been in touch with his Kateri McUncle in [place] who will be supporting [name] financially, however [name] is not interested in moving and awaiting his disability check. He is agreeable if no admission bed for [facility] is available he will go to [facility] in [place].  LCSW will call for patient today to see if bed available. Patient is also agreeable to following up with [facility], [facility] and [facility].  Ashley JacobsHannah Nail, MSW, LCSW Clinical Lead [phone]

## 2013-12-24 NOTE — Progress Notes (Signed)
Patient ID: [name], male   DOB: 06/21/1956, 58 y.o.   MRN: [id number]  D: Patient has a flat affect on approach. Brightens when speaking but then becomes irritable when speaking about his roommate. Asking since last night to move to another room but no other rooms available at this time. Continues to ruminate about roommate. Depression "8" and hopelessness "6". Currently denies any SI.  A: Staff will monitor on q 15 minutes, follow treatment plan, and give meds as ordered. R: Cooperative on unit.

## 2013-12-24 NOTE — Progress Notes (Signed)
D. Pt has been up and has been visible in milieu this evening. Pt spoke about how he is feeling better however he still spoke about feeling depressed. Pt did not endorse any auditory hallucinations or suicidal thoughts. Pt spoke briefly about where he is to go when he gets discharged and spoke about [facility]. Pt has received all medications this evening without incident and did not verbalize any complaints of pain. A. Support and encouragement provided, medication education given. R. Pt verbalized understanding, safety maintained.

## 2013-12-25 LAB — GLUCOSE, CAPILLARY
GLUCOSE-CAPILLARY: 272 mg/dL — AB (ref 70–99)
Glucose-Capillary: 181 mg/dL — ABNORMAL HIGH (ref 70–99)
Glucose-Capillary: 113 mg/dL — ABNORMAL HIGH (ref 70–99)
Glucose-Capillary: 74 mg/dL (ref 70–99)

## 2013-12-25 NOTE — [facility] Group Notes (Signed)
[facility] Group Notes:  (Clinical Social Work)  12/25/2013  11:00-11:45AM  Summary of Progress/Problems:   The main focus of today's process group was to discuss how each patient is feeling today, and what challenges are facing them when they discharge.    The patient expressed some concerns about discharging Monday when he does not have a place to stay that night, and [facility]  Another patient may be in a hotel and able to provide a bed for that night.  He provided much support to other patients, was uplifting and encouraging to them.  Type of Therapy:  Group Therapy - Process  Participation Level:  Active  Participation Quality:  Attentive and Sharing  Affect:  Appropriate  Cognitive:  Appropriate  Insight:  Engaged  Engagement in Therapy:  Engaged  Modes of Intervention:  Clarification, Education, Exploration, Discussion  Ambrose MantleMareida Grossman-Orr, LCSW 12/25/2013, 12:55 PM

## 2013-12-25 NOTE — Progress Notes (Signed)
[facility] Group Notes:  (Nursing/MHT/Case Management/Adjunct)  Date:  12/25/2013  Time:  8:00 p.m.   Type of Therapy:  Psychoeducational Skills  Participation Level:  Active  Participation Quality:  Appropriate  Affect:  Blunted  Cognitive:  Appropriate  Insight:  Good  Engagement in Group:  Engaged  Modes of Intervention:  Education  Summary of Progress/Problems: The patient expressed in group that he had a good day overall. He expressed that he was grateful to God for allowing him to wake up this morning. He talked briefly about the need to "move forward" and that he was feeling better since being admitted to the hospital. As a theme for the day, his coping skill is to draw. He indicated that drawing helps him to take his mind off the voices that he hears.   Hazle CocaGOODMAN, Pinki Rottman S 12/25/2013, 9:12 PM

## 2013-12-25 NOTE — Progress Notes (Addendum)
12-25-13 NSG NOTE  7a-3p  D: Affect is depressed, but brightens on approach and with interaction.  Mood is depressed.  Behavior is cooperative with encouragement, direction and support.  Interacts appropriately with peers and staff.  Participated in RN group, counselor lead group.  Reports fair sleep, good appetite, normal energy levels and good ability to pay attention.  Focus for today healthy coping skills, and replacing negative coping skills with positive ones.  A:  Medications per MD order.  Support given throughout day.  1:1 time spent with pt.  R:  Following treatment plan.  Denies HI/SI, auditory or visual hallucinations.  Contracts for safety.

## 2013-12-25 NOTE — [facility] Group Notes (Signed)
[facility] Group Notes:  (Nursing/MHT/Case Management/Adjunct)  Date:  12/25/2013  Time:  11:03 AM  Type of Therapy:  Psychoeducational Skills  Participation Level:  Active  Participation Quality:  Appropriate  Affect:  Appropriate  Cognitive:  Oriented  Insight:  Appropriate  Engagement in Group:  Engaged  Modes of Intervention:  Discussion  Summary of Progress/Problems: Discussion and self inventory review.   Inda MerlinWilliams, Pershing R 12/25/2013, 11:03 AM

## 2013-12-25 NOTE — Progress Notes (Signed)
Patient ID: [name], male   DOB: [birth year], 58 y.o.   MRN: [id number] [facility] MD Progress Note  12/25/2013 10:39 AM [name]  MRN:  [id number] Subjective:   Patient states to hear voices but less intense.  Objective:  Patient reports decreased anxiety and hallucinations and tolerating meds. Still guarded and isolate or withdraw himself.   Diagnosis:   DSM5: Schizophrenia Disorders:  Schizophrenia (295.7) Obsessive-Compulsive Disorders:   Trauma-Stressor Disorders:   Substance/Addictive Disorders:   Depressive Disorders:   Total Time spent with patient:  Axis I: Schizophrenia Unspecified, Cocaine abuse             Depressive disorder, NOS Axis II: Deferred Axis III:  Past Medical History  Diagnosis Date  . Diabetes mellitus   . Hypertension   . Gout   . MI (myocardial infarction)   . Chronic pain     right elbow   Axis IV: economic problems, housing problems, occupational problems, other psychosocial or environmental problems and problems with primary support group Axis V: 41-50 serious symptoms  ADL's:  Intact  Sleep: Good  Appetite:  Fair  Suicidal Ideation:  Passive SI no plan  Homicidal Ideation:  Denies  AEB (as evidenced by):  Psychiatric Specialty Exam: Physical Exam  Psychiatric: His speech is normal. His mood appears anxious. He is agitated and actively hallucinating. Cognition and memory are normal. He expresses impulsivity. He exhibits a depressed mood. He expresses homicidal ideation.    Review of Systems  Constitutional: Negative.   HENT: Negative.   Eyes: Negative.   Respiratory: Negative.   Cardiovascular: Negative.   Gastrointestinal: Negative.   Genitourinary: Negative.   Musculoskeletal: Negative.   Skin: Negative.   Neurological: Negative.   Endo/Heme/Allergies: Negative.   Psychiatric/Behavioral: Positive for depression, hallucinations and substance abuse. Negative for suicidal ideas and memory loss. The patient is  nervous/anxious and has insomnia.     Blood pressure 138/79, pulse 94, temperature 97.3 F (36.3 C), temperature source Oral, resp. rate 18, height 5\' 4"  (1.626 m), weight 80 kg (176 lb 5.9 oz), SpO2 99.00%.Body mass index is 30.26 kg/(m^2).  General Appearance: fairly groomed  Patent attorney::  Fair  Speech:  Clear and Coherent  Volume:  Decreased  Mood:  Anxious and Irritable  Affect:  Blunt  Thought Process:  Circumstantial  Orientation:  Full (Time, Place, and Person)  Thought Content:  Hallucinations: Auditory   Suicidal Thoughts:  denies  Homicidal Thoughts: Yes  Memory:  Immediate;   Good Recent;   Good Remote;   Good  Judgement:  Impaired  Insight:  Shallow  Psychomotor Activity:  Decreased  Concentration:  Fair  Recall:  Good  Fund of Knowledge:Fair  Language: Good  Akathisia:  No  Handed:  Right  AIMS (if indicated):     Assets:  Communication Skills Desire for Improvement Leisure Time Physical Health Resilience Social Support  Sleep:  Number of Hours: 3.75   Musculoskeletal: Strength & Muscle Tone: within normal limits Gait & Station: normal Patient leans: N/A  Current Medications: Current Facility-Administered Medications  Medication Dose Route Frequency Provider Last Rate Last Dose  . acetaminophen (TYLENOL) tablet 650 mg  650 mg Oral Q6H PRN Court Joy, PA-C      . allopurinol (ZYLOPRIM) tablet 100 mg  100 mg Oral Daily Kerry Hough, PA-C   100 mg at 12/25/13 0830  . alum & mag hydroxide-simeth (MAALOX/MYLANTA) 200-200-20 MG/5ML suspension 30 mL  30 mL Oral Q4H PRN Josephine C  Onuoha, NP      . ARIPiprazole (ABILIFY) tablet 7.5 mg  7.5 mg Oral Q2200 Mojeed Akintayo   7.5 mg at 12/24/13 2148  . cloNIDine (CATAPRES) tablet 0.1 mg  0.1 mg Oral Q8H PRN Fransisca Kaufmann, NP      . colchicine tablet 0.6 mg  0.6 mg Oral BID Kerry Hough, PA-C   0.6 mg at 12/25/13 0831  . enalapril (VASOTEC) tablet 20 mg  20 mg Oral Daily Court Joy, PA-C   20 mg at  12/25/13 0830  . hydrochlorothiazide (MICROZIDE) capsule 12.5 mg  12.5 mg Oral Daily Court Joy, PA-C   12.5 mg at 12/25/13 0830  . ibuprofen (ADVIL,MOTRIN) tablet 600 mg  600 mg Oral Q8H PRN Earney Navy, NP      . insulin aspart (novoLOG) injection 0-20 Units  0-20 Units Subcutaneous TID WC Fransisca Kaufmann, NP   4 Units at 12/25/13 [phone]  . insulin aspart (novoLOG) injection 0-5 Units  0-5 Units Subcutaneous QHS Fransisca Kaufmann, NP      . insulin aspart protamine- aspart (NOVOLOG MIX 70/30) injection 42 Units  42 Units Subcutaneous BID WC Fransisca Kaufmann, NP   42 Units at 12/25/13 0829  . LORazepam (ATIVAN) tablet 1 mg  1 mg Oral Q8H PRN Earney Navy, NP   1 mg at 12/20/13 1430  . magnesium hydroxide (MILK OF MAGNESIA) suspension 30 mL  30 mL Oral Daily PRN Court Joy, PA-C      . metFORMIN (GLUCOPHAGE) tablet 1,000 mg  1,000 mg Oral BID WC Earney Navy, NP   1,000 mg at 12/25/13 0830  . OLANZapine zydis (ZYPREXA) disintegrating tablet 10 mg  10 mg Oral Q8H PRN Fransisca Kaufmann, NP   10 mg at 12/20/13 2127  . traZODone (DESYREL) tablet 25 mg  25 mg Oral QHS Mojeed Akintayo   25 mg at 12/24/13 2149    Lab Results:  Results for orders placed during the hospital encounter of 12/18/13 (from the past 48 hour(s))  GLUCOSE, CAPILLARY     Status: Abnormal   Collection Time    12/23/13 11:26 AM      Result Value Ref Range   Glucose-Capillary 323 (*) 70 - 99 mg/dL  GLUCOSE, CAPILLARY     Status: Abnormal   Collection Time    12/23/13  5:09 PM      Result Value Ref Range   Glucose-Capillary 263 (*) 70 - 99 mg/dL   Comment 1 Documented in Chart     Comment 2 Notify RN    GLUCOSE, CAPILLARY     Status: Abnormal   Collection Time    12/23/13  8:42 PM      Result Value Ref Range   Glucose-Capillary 131 (*) 70 - 99 mg/dL  GLUCOSE, CAPILLARY     Status: Abnormal   Collection Time    12/24/13  6:27 AM      Result Value Ref Range   Glucose-Capillary 187 (*) 70 - 99 mg/dL  GLUCOSE,  CAPILLARY     Status: Abnormal   Collection Time    12/24/13 11:54 AM      Result Value Ref Range   Glucose-Capillary 188 (*) 70 - 99 mg/dL  GLUCOSE, CAPILLARY     Status: Abnormal   Collection Time    12/24/13  5:20 PM      Result Value Ref Range   Glucose-Capillary 138 (*) 70 - 99 mg/dL   Comment 1 Notify RN  GLUCOSE, CAPILLARY     Status: None   Collection Time    12/24/13  9:11 PM      Result Value Ref Range   Glucose-Capillary 76  70 - 99 mg/dL    Physical Findings: AIMS: Facial and Oral Movements Muscles of Facial Expression: None, normal Lips and Perioral Area: None, normal Jaw: None, normal Tongue: None, normal,Extremity Movements Upper (arms, wrists, hands, fingers): None, normal Lower (legs, knees, ankles, toes): None, normal, Trunk Movements Neck, shoulders, hips: None, normal, Overall Severity Severity of abnormal movements (highest score from questions above): None, normal Incapacitation due to abnormal movements: None, normal Patient's awareness of abnormal movements (rate only patient's report): No Awareness, Dental Status Current problems with teeth and/or dentures?: Yes Does patient usually wear dentures?: No  CIWA:    COWS:     Treatment Plan Summary: Daily contact with patient to assess and evaluate symptoms and progress in treatment Medication management  Plan: 1. Continue crisis management and stabilization.  2. Medication management: Reviewed with patient who stated no untoward effects. - Continue Abilify to 7.5 mg daily for psychosis. - Continue Zoloft to 150 mg daily for depressive symptoms.  -Continue Zyprexa Zydis 10 mg every eight hours prn psychosis.  3. Encouraged patient to attend groups and participate in group counseling sessions and activities.  4. Discharge plan in progress. Anticipate d/c Monday. Social worker trying to secure treatment [facility].  5. Continue current treatment plan.  6. Address health issues:  Continue Vasotec 20  mg/HCTZ 12.5 mg daily for elevated blood pressure. Monitoring CBG which are showing stabilization after 70/30 was increased to 42 units twice daily.   Medical Decision Making Problem Points:  Established problem, improving (2), Review of last therapy session (1) and Review of psycho-social stressors (1) Data Points:  Review or order clinical lab tests (1) Review or order medicine tests (1) Review of medication regiment & side effects (2) Review of new medications or change in dosage (2)  I certify that inpatient services furnished can reasonably be expected to improve the patient's condition.   Thresa RossAKHTAR, Fines Kimberlin, MD  12/25/2013, 10:39 AM

## 2013-12-25 NOTE — [facility] Group Notes (Signed)
[facility] Group Notes:  (Nursing/MHT/Case Management/Adjunct)  Date:  12/25/2013  Time:  11:22 AM  Type of Therapy:  Psychoeducational Skills  Participation Level:  Active  Participation Quality:  Appropriate  Affect:  Appropriate  Cognitive:  Appropriate  Insight:  Appropriate  Engagement in Group:  Engaged  Modes of Intervention:  Discussion  Summary of Progress/Problems:Discussion on healthy coping skills and replacing negative coping skills with positive ones.  Inda MerlinWilliams, Rameen R 12/25/2013, 11:22 AM

## 2013-12-26 LAB — GLUCOSE, CAPILLARY
GLUCOSE-CAPILLARY: 282 mg/dL — AB (ref 70–99)
GLUCOSE-CAPILLARY: 164 mg/dL — AB (ref 70–99)
GLUCOSE-CAPILLARY: 133 mg/dL — AB (ref 70–99)
Glucose-Capillary: 173 mg/dL — ABNORMAL HIGH (ref 70–99)
Glucose-Capillary: 177 mg/dL — ABNORMAL HIGH (ref 70–99)

## 2013-12-26 NOTE — [facility] Group Notes (Signed)
[facility] Group Notes:  (Nursing/MHT/Case Management/Adjunct)  Date:  12/26/2013  Time:  0900  Type of Therapy:  Psychoeducational Skills-self inventory review with Rn  Participation Level:  Active  Participation Quality:  Appropriate  Affect:  Appropriate  Cognitive:  Appropriate  Insight:  Appropriate  Engagement in Group:  Engaged and Improving  Modes of Intervention:  Discussion, Education and Exploration  Summary of Progress/Problems:  Self inventory review with RN  Malva LimesStrader, Clarivel Callaway 12/26/2013, 11:16 AM

## 2013-12-26 NOTE — Progress Notes (Signed)
Patient ID: Justin KeensLarry B Delacruz, male   DOB: 11/08/1955, 58 y.o.   MRN: [id number] D. The patient had a anxious mood and affect. Discussed his recovery discharge plans. Stated that he he was being discharged tomorrow but [facility]. He will attend there 28 day program but is feeling anxious regarding what will happen after that. He shared that he had been in prison for 39 years and has no experience paying bills or finding a place to live. He does not know how he will support himself. A. Encouraged the patient to attend evening group. Verbal support provided. Administered HS medication. R. The patient attended and actively participate in evening group. He was very receptive to suggestions and ideas that his peers shared with him. Compliant with medications.

## 2013-12-26 NOTE — Progress Notes (Signed)
Patient ID: [name], male   DOB: Mar 16, 1956, 58 y.o.   MRN: [id number] [facility] MD Progress Note  12/26/2013 11:18 AM [name]  MRN:  [id number] Subjective:   Patient states to hear voices but less intense.  Objective:  Patient reports decreased anxiety and hallucinations and tolerating meds. Still guarded but not isolating himself. Attending groups. Slept better. No command hallucinations and affect is improved.  Diagnosis:   DSM5: Schizophrenia Disorders:  Schizophrenia (295.7) Obsessive-Compulsive Disorders:   Trauma-Stressor Disorders:   Substance/Addictive Disorders:   Depressive Disorders:   Total Time spent with patient:  Axis I: Schizophrenia Unspecified, Cocaine abuse             Depressive disorder, NOS Axis II: Deferred Axis III:  Past Medical History  Diagnosis Date  . Diabetes mellitus   . Hypertension   . Gout   . MI (myocardial infarction)   . Chronic pain     right elbow   Axis IV: economic problems, housing problems, occupational problems, other psychosocial or environmental problems and problems with primary support group Axis V: 41-50 serious symptoms  ADL's:  Intact  Sleep: Good  Appetite:  Fair  Suicidal Ideation:  Passive SI no plan  Homicidal Ideation:  Denies  AEB (as evidenced by):  Psychiatric Specialty Exam: Physical Exam  Psychiatric: His speech is normal. His mood appears anxious. He is agitated and actively hallucinating. Cognition and memory are normal. He expresses impulsivity. He exhibits a depressed mood. He expresses homicidal ideation.    Review of Systems  Constitutional: Negative.   HENT: Negative.   Eyes: Negative.   Respiratory: Negative.   Cardiovascular: Negative.   Gastrointestinal: Negative.   Genitourinary: Negative.   Musculoskeletal: Negative.   Skin: Negative.   Neurological: Negative.   Endo/Heme/Allergies: Negative.   Psychiatric/Behavioral: Positive for depression, hallucinations and substance  abuse. Negative for suicidal ideas and memory loss. The patient is nervous/anxious and has insomnia.     Blood pressure 131/77, pulse 103, temperature 97 F (36.1 C), temperature source Oral, resp. rate 20, height 5\' 4"  (1.626 m), weight 80 kg (176 lb 5.9 oz), SpO2 99.00%.Body mass index is 30.26 kg/(m^2).  General Appearance: fairly groomed  Patent attorney::  Fair  Speech:  Clear and Coherent  Volume:  Decreased  Mood:  Anxious and Irritable  Affect:  Blunt  Thought Process:  Circumstantial  Orientation:  Full (Time, Place, and Person)  Thought Content:  Hallucinations: Auditory   Suicidal Thoughts:  denies  Homicidal Thoughts: Yes  Memory:  Immediate;   Good Recent;   Good Remote;   Good  Judgement:  Impaired  Insight:  Shallow  Psychomotor Activity:  Decreased  Concentration:  Fair  Recall:  Good  Fund of Knowledge:Fair  Language: Good  Akathisia:  No  Handed:  Right  AIMS (if indicated):     Assets:  Communication Skills Desire for Improvement Leisure Time Physical Health Resilience Social Support  Sleep:  Number of Hours: 5.75   Musculoskeletal: Strength & Muscle Tone: within normal limits Gait & Station: normal Patient leans: N/A  Current Medications: Current Facility-Administered Medications  Medication Dose Route Frequency Provider Last Rate Last Dose  . acetaminophen (TYLENOL) tablet 650 mg  650 mg Oral Q6H PRN Court Joy, PA-C      . allopurinol (ZYLOPRIM) tablet 100 mg  100 mg Oral Daily Kerry Hough, PA-C   100 mg at 12/26/13 7829  . alum & mag hydroxide-simeth (MAALOX/MYLANTA) 200-200-20 MG/5ML suspension 30  mL  30 mL Oral Q4H PRN Earney NavyJosephine C Onuoha, NP      . ARIPiprazole (ABILIFY) tablet 7.5 mg  7.5 mg Oral Q2200 Mojeed Akintayo   7.5 mg at 12/25/13 2207  . cloNIDine (CATAPRES) tablet 0.1 mg  0.1 mg Oral Q8H PRN Fransisca KaufmannLaura Davis, NP      . colchicine tablet 0.6 mg  0.6 mg Oral BID Kerry HoughSpencer E Simon, PA-C   0.6 mg at 12/26/13 [id number]  . enalapril (VASOTEC)  tablet 20 mg  20 mg Oral Daily Court Joyharles E Kober, PA-C   20 mg at 12/26/13 [id number]  . hydrochlorothiazide (MICROZIDE) capsule 12.5 mg  12.5 mg Oral Daily Court Joyharles E Kober, PA-C   12.5 mg at 12/26/13 [id number]  . ibuprofen (ADVIL,MOTRIN) tablet 600 mg  600 mg Oral Q8H PRN Earney NavyJosephine C Onuoha, NP   600 mg at 12/25/13 1727  . insulin aspart (novoLOG) injection 0-20 Units  0-20 Units Subcutaneous TID WC Fransisca KaufmannLaura Davis, NP   4 Units at 12/26/13 0636  . insulin aspart (novoLOG) injection 0-5 Units  0-5 Units Subcutaneous QHS Fransisca KaufmannLaura Davis, NP      . insulin aspart protamine- aspart (NOVOLOG MIX 70/30) injection 42 Units  42 Units Subcutaneous BID WC Fransisca KaufmannLaura Davis, NP   42 Units at 12/26/13 0820  . LORazepam (ATIVAN) tablet 1 mg  1 mg Oral Q8H PRN Earney NavyJosephine C Onuoha, NP   1 mg at 12/20/13 1430  . magnesium hydroxide (MILK OF MAGNESIA) suspension 30 mL  30 mL Oral Daily PRN Court Joyharles E Kober, PA-C      . metFORMIN (GLUCOPHAGE) tablet 1,000 mg  1,000 mg Oral BID WC Earney NavyJosephine C Onuoha, NP   1,000 mg at 12/26/13 0824  . OLANZapine zydis (ZYPREXA) disintegrating tablet 10 mg  10 mg Oral Q8H PRN Fransisca KaufmannLaura Davis, NP   10 mg at 12/20/13 2127  . traZODone (DESYREL) tablet 25 mg  25 mg Oral QHS Mojeed Akintayo   25 mg at 12/25/13 2207    Lab Results:  Results for orders placed during the hospital encounter of 12/18/13 (from the past 48 hour(s))  GLUCOSE, CAPILLARY     Status: Abnormal   Collection Time    12/24/13 11:54 AM      Result Value Ref Range   Glucose-Capillary 188 (*) 70 - 99 mg/dL  GLUCOSE, CAPILLARY     Status: Abnormal   Collection Time    12/24/13  5:20 PM      Result Value Ref Range   Glucose-Capillary 138 (*) 70 - 99 mg/dL   Comment 1 Notify RN    GLUCOSE, CAPILLARY     Status: None   Collection Time    12/24/13  9:11 PM      Result Value Ref Range   Glucose-Capillary 76  70 - 99 mg/dL  GLUCOSE, CAPILLARY     Status: Abnormal   Collection Time    12/25/13  5:47 AM      Result Value Ref Range    Glucose-Capillary 181 (*) 70 - 99 mg/dL  GLUCOSE, CAPILLARY     Status: Abnormal   Collection Time    12/25/13 12:07 PM      Result Value Ref Range   Glucose-Capillary 272 (*) 70 - 99 mg/dL   Comment 1 Documented in Chart     Comment 2 Notify RN    GLUCOSE, CAPILLARY     Status: Abnormal   Collection Time    12/25/13  5:11 PM      Result  Value Ref Range   Glucose-Capillary 113 (*) 70 - 99 mg/dL   Comment 1 Documented in Chart     Comment 2 Notify RN    GLUCOSE, CAPILLARY     Status: None   Collection Time    12/25/13  8:54 PM      Result Value Ref Range   Glucose-Capillary 74  70 - 99 mg/dL  GLUCOSE, CAPILLARY     Status: Abnormal   Collection Time    12/26/13  6:21 AM      Result Value Ref Range   Glucose-Capillary 173 (*) 70 - 99 mg/dL  GLUCOSE, CAPILLARY     Status: Abnormal   Collection Time    12/26/13  8:04 AM      Result Value Ref Range   Glucose-Capillary 282 (*) 70 - 99 mg/dL    Physical Findings: AIMS: Facial and Oral Movements Muscles of Facial Expression: None, normal Lips and Perioral Area: None, normal Jaw: None, normal Tongue: None, normal,Extremity Movements Upper (arms, wrists, hands, fingers): None, normal Lower (legs, knees, ankles, toes): None, normal, Trunk Movements Neck, shoulders, hips: None, normal, Overall Severity Severity of abnormal movements (highest score from questions above): None, normal Incapacitation due to abnormal movements: None, normal Patient's awareness of abnormal movements (rate only patient's report): No Awareness, Dental Status Current problems with teeth and/or dentures?: Yes Does patient usually wear dentures?: No  CIWA:    COWS:     Treatment Plan Summary: Daily contact with patient to assess and evaluate symptoms and progress in treatment Medication management Not endorsing thoughts of harming self or other now. Possible discharge Monday. Was requesting Tuesday discharge so he can follow up directly with  [facility].  Plan: 1. Continue crisis management and stabilization.  2. Medication management: Reviewed with patient who stated no untoward effects. - Continue Abilify to 7.5 mg daily for psychosis. - Continue Zoloft to 150 mg daily for depressive symptoms.  -Continue Zyprexa Zydis 10 mg every eight hours prn psychosis.  3. Encouraged patient to attend groups and participate in group counseling sessions and activities.  4. Discharge plan in progress. Anticipate d/c Monday. Social worker trying to secure treatment at [facility].  5. Continue current treatment plan.  6. Address health issues:  Continue Vasotec 20 mg/HCTZ 12.5 mg daily for elevated blood pressure. Monitoring CBG which are showing stabilization after 70/30 was increased to 42 units twice daily.    Medical Decision Making Problem Points:  Established problem, improving (2), Review of last therapy session (1) and Review of psycho-social stressors (1) Data Points:  Review or order clinical lab tests (1) Review or order medicine tests (1) Review of medication regiment & side effects (2) Review of new medications or change in dosage (2)  I certify that inpatient services furnished can reasonably be expected to improve the patient's condition.   Thresa Ross, MD  12/26/2013, 11:18 AM

## 2013-12-26 NOTE — [facility] Group Notes (Signed)
[facility] Group Notes:  (Nursing/MHT/Case Management/Adjunct)  Date:  12/26/2013  Time:  11:17 AM  Type of Therapy:  Psychoeducational Skills--Healthy Support Systems  Participation Level:  Active  Participation Quality:  Appropriate  Affect:  Appropriate  Cognitive:  Appropriate  Insight:  Appropriate  Engagement in Group:  Engaged  Modes of Intervention:  Discussion, Education and Exploration  Summary of Progress/Problems:Psychoeducational Skills Review with a focus on healthy support systems. Discussed identifying negative supports/triggers to replace with healthy support systems for recovery in mental illness.    Malva LimesStrader, Adriona Kaney 12/26/2013, 11:17 AM

## 2013-12-26 NOTE — [facility] Group Notes (Signed)
[facility] Group Notes:  (Clinical Social Work)  12/26/2013   11:15am-12:00pm  Summary of Progress/Problems:  The main focus of today's process group was to listen to a variety of genres of music and to identify that different types of music provoke different responses.  The patient then was able to identify personally what was soothing for them, as well as energizing.  Handouts were used to record feelings evoked, as well as how patient can personally use this knowledge in sleep habits, with depression, and with other symptoms.  The patient expressed understanding of concepts, as well as knowledge of how each type of music affected him and how this can be used at home as a wellness/recovery tool.  He was familiar with this group from last week and was comfortable in requesting songs, singing along.  Was very happy throughout group.  Type of Therapy:  Music Therapy   Participation Level:  Active  Participation Quality:  Attentive and Sharing  Affect:  Appropriate  Cognitive:  Oriented  Insight:  Engaged  Engagement in Therapy:  Engaged  Modes of Intervention:   Activity, Exploration  Ambrose MantleMareida Grossman-Orr, LCSW 12/26/2013, 12:30pm

## 2013-12-26 NOTE — Progress Notes (Signed)
D:  +ve AH- contracts.  Pt denies SI/HI/VH. Pt is pleasant and cooperative. Pt stated he was thankful that he had something to look forward to. Pt [facility].   A: Pt was offered support and encouragement. Pt was given scheduled medications. Pt was encourage to attend groups. Q 15 minute checks were done for safety.   R:Pt attends groups and interacts well with peers and staff. Pt is taking medication. Pt has no complaints at this time.Pt receptive to treatment and safety maintained on unit.

## 2013-12-26 NOTE — Progress Notes (Signed)
[facility] Group Notes:  (Nursing/MHT/Case Management/Adjunct)  Date:  12/26/2013  Time:  8:00 p.m.   Type of Therapy:  Psychoeducational Skills  Participation Level:  Active  Participation Quality:  Attentive  Affect:  Depressed  Cognitive:  Appropriate  Insight:  Improving  Engagement in Group:  Developing/Improving  Modes of Intervention:  Education  Summary of Progress/Problems: The patient shared in group this evening that he had a good day thanks to God for allowing him to wake up. In terms of the theme for the day, his support system will consist of God and himself. The patient has a deep mistrust of others who promise to do things for him.   Shariece Viveiros S 12/26/2013, 11:03 PM

## 2013-12-27 DIAGNOSIS — F332 Major depressive disorder, recurrent severe without psychotic features: Secondary | ICD-10-CM

## 2013-12-27 LAB — GLUCOSE, CAPILLARY
Glucose-Capillary: 125 mg/dL — ABNORMAL HIGH (ref 70–99)
Glucose-Capillary: 240 mg/dL — ABNORMAL HIGH (ref 70–99)
Glucose-Capillary: 210 mg/dL — ABNORMAL HIGH (ref 70–99)
Glucose-Capillary: 173 mg/dL — ABNORMAL HIGH (ref 70–99)

## 2013-12-27 MED ORDER — TRAZODONE 25 MG HALF TABLET: 25.0000 mg | ORAL_TABLET | Freq: Every day | ORAL | Status: DC

## 2013-12-27 MED ORDER — ALLOPURINOL 100 MG PO TABS: 100.0000 mg | ORAL_TABLET | Freq: Every day | ORAL | Status: DC

## 2013-12-27 MED ORDER — ARIPIPRAZOLE 15 MG PO TABS: 7.5000 mg | ORAL_TABLET | Freq: Every day | ORAL | Status: DC

## 2013-12-27 MED ORDER — INSULIN NPH ISOPHANE & REGULAR (70-30) 100 UNIT/ML ~~LOC~~ SUSP: 40.0000 [IU] | Freq: Two times a day (BID) | SUBCUTANEOUS | Status: DC

## 2013-12-27 MED ORDER — COLCHICINE 0.6 MG PO TABS: 0.6000 mg | ORAL_TABLET | Freq: Two times a day (BID) | ORAL | Status: DC

## 2013-12-27 MED ORDER — INSULIN NPH ISOPHANE & REGULAR (70-30) 100 UNIT/ML ~~LOC~~ SUSP
40.0000 [IU] | Freq: Two times a day (BID) | SUBCUTANEOUS | Status: DC
  Filled 2013-12-27 (×2): qty 10

## 2013-12-27 MED ORDER — INSULIN REGULAR HUMAN 100 UNIT/ML IJ SOLN: 2.0000 [IU] | Freq: Three times a day (TID) | INTRAMUSCULAR | Status: DC

## 2013-12-27 MED ORDER — HYDROCHLOROTHIAZIDE 12.5 MG PO CAPS: 12.5000 mg | ORAL_CAPSULE | Freq: Every day | ORAL | Status: DC

## 2013-12-27 MED ORDER — METFORMIN HCL 1000 MG PO TABS: 1000.0000 mg | ORAL_TABLET | Freq: Two times a day (BID) | ORAL | Status: DC

## 2013-12-27 MED ORDER — INSULIN ASPART 100 UNIT/ML ~~LOC~~ SOLN
2.0000 [IU] | Freq: Three times a day (TID) | SUBCUTANEOUS | Status: DC
Start: 2013-12-27 — End: 2013-12-27
  Filled 2013-12-27: qty 0.15

## 2013-12-27 MED ORDER — ENALAPRIL MALEATE 20 MG PO TABS: 20.0000 mg | ORAL_TABLET | Freq: Every day | ORAL | Status: DC

## 2013-12-27 MED ORDER — INSULIN ASPART PROT & ASPART (70-30 MIX) 100 UNIT/ML ~~LOC~~ SUSP
40.0000 [IU] | Freq: Two times a day (BID) | SUBCUTANEOUS | Status: DC
  Filled 2013-12-27: qty 0.4

## 2013-12-27 NOTE — [facility] Suicide Risk Assessment (Signed)
Suicide Risk Assessment  Discharge Assessment     Demographic Factors:  Male  Total Time spent with patient: 45 minutes  Psychiatric Specialty Exam:     Blood pressure 152/85, pulse 88, temperature 98.7 F (37.1 C), temperature source Oral, resp. rate 20, height 5\' 4"  (1.626 m), weight 80 kg (176 lb 5.9 oz), SpO2 99.00%.Body mass index is 30.26 kg/(m^2).  General Appearance: Fairly Groomed  Patent attorneyye Contact::  Fair  Speech:  Clear and Coherent  Volume:  Decreased  Mood:  Euthymic  Affect:  Restricted  Thought Process:  Coherent and Goal Directed  Orientation:  Full (Time, Place, and Person)  Thought Content:  worries, concerns, relspse prevention plan  Suicidal Thoughts:  No  Homicidal Thoughts:  No  Memory:  Immediate;   Fair Recent;   Fair Remote;   Fair  Judgement:  Fair  Insight:  Present  Psychomotor Activity:  Normal  Concentration:  Fair  Recall:  FiservFair  Fund of Knowledge:NA  Language: Fair  Akathisia:  No  Handed:    AIMS (if indicated):     Assets:  Desire for Improvement  Sleep:  Number of Hours: 4.5    Musculoskeletal: Strength & Muscle Tone: within normal limits Gait & Station: normal Patient leans: N/A   Mental Status Per Nursing Assessment::   On Admission:  Self-harm thoughts  Current Mental Status by Physician: In full contact with reality. There are no active S/S of withdrawal. Looking forward to [facility]   Loss Factors: Decline in physical health  Historical Factors: NA  Risk Reduction Factors:   Living with another person, especially a relative and Positive social support  Continued Clinical Symptoms:  Alcohol/Substance Abuse/Dependencies Medical Diagnoses and Treatments/Surgeries  Cognitive Features That Contribute To Risk:  Closed-mindedness Polarized thinking Thought constriction (tunnel vision)    Suicide Risk:  Minimal: No identifiable suicidal ideation.  Patients presenting with no risk factors but with morbid ruminations; may  be classified as minimal risk based on the severity of the depressive symptoms  Discharge Diagnoses:   AXIS I:  Cocaine Dependence Psychotic Disorder AXIS II:  Deferred AXIS III:   Past Medical History  Diagnosis Date  . Diabetes mellitus   . Hypertension   . Gout   . MI (myocardial infarction)   . Chronic pain     right elbow  . Major depression, chronic    AXIS IV:  other psychosocial or environmental problems AXIS V:  61-70 mild symptoms  Plan Of Care/Follow-up recommendations:  Activity:  as tolerated Diet:  as per nutritionist Follow up [facility] Is patient on multiple antipsychotic therapies at discharge:  No   Has Patient had three or more failed trials of antipsychotic monotherapy by history:  No  Recommended Plan for Multiple Antipsychotic Therapies: NA    Latonia Conrow A 12/27/2013, 7:46 PM

## 2013-12-27 NOTE — Progress Notes (Addendum)
All of pts paperwork is completed for him to leave by 6:15am . All RX are in his packet along with a bus pass. Pt is very pleasant and cooperative. He has been in the dayroom drawing . Pt was instructed to make sure breakfast is brought back for him early in the am before he catches the bus. He denies SI and HI and is very excited about leaving in the am.

## 2013-12-27 NOTE — Progress Notes (Signed)
Recreation Therapy Notes  Date: 03.09.2015 Time: 11:55am Duration: 15 minutes   Group Topic: 1:1  Recreation Therapy Goal Addressed:  Through the use of guided imagery patient will learn coping skill to use as alternate to drawing during admission.   Patient progressing towards goal? yes  Behavioral Response: Engaged, Attentive, Appropriate   Education: [place] Planning   Education Outcome: Acknowledges understanding  Clinical Observations/Feedback: Patient showed LRT drawings he worked on over the weekend and dicussed with LRT practice of guided imagery over the weekend. Patient shared that he practiced as requested, patient stated he is learning to truly focus his attention on positive thoughts and his custom designed guided imagery. Patient expressed he had some difficulty over the weekend, but attributed this to not being in a fully focused state. Patient stated that once he was able to really focus his attention on imagining what he would like to draw he was able to successfully practice guided imagery. Patient commented on the amount of focus this took and disclosed positive feelings associated with this technique. LRT and patient discussed times he can use this technique. Patient identified when he is feeling depressed or anxious. Patient encouraged to continue to practice this technique post d/c, patient agreed to do so.   Patient spoke about his hopes for his treatment at [facility] and appeared happy and encouraged to continue his treatment after d/c.   Recreation therapy goal met during 1:1 treatment sessions with LRT.   Laureen Ochs Makeya Hilgert, LRT/CTRS  Lane Hacker 12/27/2013 4:29 PM

## 2013-12-27 NOTE — Progress Notes (Signed)
Patient ID: [name], male   DOB: April 05, 1956, 58 y.o.   MRN: [id number]  [facility] MD Progress Note  12/27/2013 11:23 AM [name]  MRN:  [id number] Subjective:   Patient states "I'm not hearing any voices today. Saturday was a very rough day because they were bad but have been decreasing since. I don't feel like hurting myself. I'm really grateful for all the help that I received here. I'm hopeful that [facility] will be able to help with my problems."  Objective:  Patient reports resolving symptoms of psychosis and depression. Rates his depression at two today. Thomson feels that his medications have helped to stabilize his symptoms. He also reports learning many useful coping skills during his time here. Patient is observed showing his artwork to his peers in the dayroom. Elwyn reports that he is trying to find meaning in the struggles that he has faced. Patient will be leaving early tomorrow morning for [facility].   Diagnosis:   DSM5: Schizophrenia Disorders:  Schizophrenia (295.7) Obsessive-Compulsive Disorders:   Trauma-Stressor Disorders:   Substance/Addictive Disorders:   Depressive Disorders:   Total Time spent with patient: 15 min  Axis I: Schizophrenia Unspecified, Cocaine abuse             Major Depressive Disorder, Recurrent Severe  Axis II: Deferred Axis III:  Past Medical History  Diagnosis Date  . Diabetes mellitus   . Hypertension   . Gout   . MI (myocardial infarction)   . Chronic pain     right elbow   Axis IV: economic problems, housing problems, occupational problems, other psychosocial or environmental problems and problems with primary support group Axis V: 51-60 moderate symptoms  ADL's:  Intact  Sleep: Good  Appetite:  Fair  Suicidal Ideation:  Passive SI no plan  Homicidal Ideation:  Denies  AEB (as evidenced by):  Psychiatric Specialty Exam: Physical Exam  Psychiatric: His speech is normal. His mood appears anxious. He is agitated and actively  hallucinating. Cognition and memory are normal. He expresses impulsivity. He exhibits a depressed mood. He expresses homicidal ideation.    Review of Systems  Constitutional: Negative.   HENT: Negative.   Eyes: Negative.   Respiratory: Negative.   Cardiovascular: Negative.   Gastrointestinal: Negative.   Genitourinary: Negative.   Musculoskeletal: Negative.   Skin: Negative.   Neurological: Negative.   Endo/Heme/Allergies: Negative.   Psychiatric/Behavioral: Positive for depression, hallucinations and substance abuse. Negative for suicidal ideas and memory loss. The patient is nervous/anxious and has insomnia.     Blood pressure 152/85, pulse 88, temperature 98.7 F (37.1 C), temperature source Oral, resp. rate 20, height 5\' 4"  (1.626 m), weight 80 kg (176 lb 5.9 oz), SpO2 99.00%.Body mass index is 30.26 kg/(m^2).  General Appearance: fairly groomed  Patent attorney::  Fair  Speech:  Clear and Coherent  Volume:  Decreased  Mood:  Depressed  Affect:  Appropriate  Thought Process:  Circumstantial  Orientation:  Full (Time, Place, and Person)  Thought Content:  Hallucinations: Auditory   Suicidal Thoughts:  denies  Homicidal Thoughts: Yes  Memory:  Immediate;   Good Recent;   Good Remote;   Good  Judgement:  Fair  Insight:  Shallow  Psychomotor Activity:  Decreased  Concentration:  Fair  Recall:  Good  Fund of Knowledge:Fair  Language: Good  Akathisia:  No  Handed:  Right  AIMS (if indicated):     Assets:  Communication Skills Desire for Improvement Leisure Time Physical Health Resilience Social  Support  Sleep:  Number of Hours: 4.5   Musculoskeletal: Strength & Muscle Tone: within normal limits Gait & Station: normal Patient leans: N/A  Current Medications: Current Facility-Administered Medications  Medication Dose Route Frequency Provider Last Rate Last Dose  . acetaminophen (TYLENOL) tablet 650 mg  650 mg Oral Q6H PRN Court Joy, PA-C      . allopurinol  (ZYLOPRIM) tablet 100 mg  100 mg Oral Daily Kerry Hough, PA-C   100 mg at 12/27/13 0757  . alum & mag hydroxide-simeth (MAALOX/MYLANTA) 200-200-20 MG/5ML suspension 30 mL  30 mL Oral Q4H PRN Earney Navy, NP      . ARIPiprazole (ABILIFY) tablet 7.5 mg  7.5 mg Oral Q2200 Axel Meas   7.5 mg at 12/26/13 2141  . cloNIDine (CATAPRES) tablet 0.1 mg  0.1 mg Oral Q8H PRN Fransisca Kaufmann, NP      . colchicine tablet 0.6 mg  0.6 mg Oral BID Kerry Hough, PA-C   0.6 mg at 12/27/13 0757  . enalapril (VASOTEC) tablet 20 mg  20 mg Oral Daily Court Joy, PA-C   20 mg at 12/27/13 0757  . hydrochlorothiazide (MICROZIDE) capsule 12.5 mg  12.5 mg Oral Daily Court Joy, PA-C   12.5 mg at 12/27/13 0757  . ibuprofen (ADVIL,MOTRIN) tablet 600 mg  600 mg Oral Q8H PRN Earney Navy, NP   600 mg at 12/26/13 1324  . insulin aspart (novoLOG) injection 0-20 Units  0-20 Units Subcutaneous TID WC Fransisca Kaufmann, NP   3 Units at 12/27/13 [phone]  . insulin aspart (novoLOG) injection 0-5 Units  0-5 Units Subcutaneous QHS Fransisca Kaufmann, NP      . insulin aspart protamine- aspart (NOVOLOG MIX 70/30) injection 42 Units  42 Units Subcutaneous BID WC Fransisca Kaufmann, NP   42 Units at 12/27/13 0800  . LORazepam (ATIVAN) tablet 1 mg  1 mg Oral Q8H PRN Earney Navy, NP   1 mg at 12/20/13 1430  . magnesium hydroxide (MILK OF MAGNESIA) suspension 30 mL  30 mL Oral Daily PRN Court Joy, PA-C      . metFORMIN (GLUCOPHAGE) tablet 1,000 mg  1,000 mg Oral BID WC Earney Navy, NP   1,000 mg at 12/27/13 0757  . OLANZapine zydis (ZYPREXA) disintegrating tablet 10 mg  10 mg Oral Q8H PRN Fransisca Kaufmann, NP   10 mg at 12/20/13 2127  . traZODone (DESYREL) tablet 25 mg  25 mg Oral QHS Jacquelene Kopecky   25 mg at 12/26/13 2140    Lab Results:  Results for orders placed during the hospital encounter of 12/18/13 (from the past 48 hour(s))  GLUCOSE, CAPILLARY     Status: Abnormal   Collection Time    12/25/13 12:07 PM       Result Value Ref Range   Glucose-Capillary 272 (*) 70 - 99 mg/dL   Comment 1 Documented in Chart     Comment 2 Notify RN    GLUCOSE, CAPILLARY     Status: Abnormal   Collection Time    12/25/13  5:11 PM      Result Value Ref Range   Glucose-Capillary 113 (*) 70 - 99 mg/dL   Comment 1 Documented in Chart     Comment 2 Notify RN    GLUCOSE, CAPILLARY     Status: None   Collection Time    12/25/13  8:54 PM      Result Value Ref Range   Glucose-Capillary 74  70 - 99 mg/dL  GLUCOSE, CAPILLARY     Status: Abnormal   Collection Time    12/26/13  6:21 AM      Result Value Ref Range   Glucose-Capillary 173 (*) 70 - 99 mg/dL  GLUCOSE, CAPILLARY     Status: Abnormal   Collection Time    12/26/13  8:04 AM      Result Value Ref Range   Glucose-Capillary 282 (*) 70 - 99 mg/dL  GLUCOSE, CAPILLARY     Status: Abnormal   Collection Time    12/26/13 12:11 PM      Result Value Ref Range   Glucose-Capillary 164 (*) 70 - 99 mg/dL   Comment 1 Notify RN    GLUCOSE, CAPILLARY     Status: Abnormal   Collection Time    12/26/13  5:09 PM      Result Value Ref Range   Glucose-Capillary 177 (*) 70 - 99 mg/dL  GLUCOSE, CAPILLARY     Status: Abnormal   Collection Time    12/26/13  9:06 PM      Result Value Ref Range   Glucose-Capillary 133 (*) 70 - 99 mg/dL  GLUCOSE, CAPILLARY     Status: Abnormal   Collection Time    12/27/13  6:03 AM      Result Value Ref Range   Glucose-Capillary 125 (*) 70 - 99 mg/dL   Comment 1 Notify RN      Physical Findings: AIMS: Facial and Oral Movements Muscles of Facial Expression: None, normal Lips and Perioral Area: None, normal Jaw: None, normal Tongue: None, normal,Extremity Movements Upper (arms, wrists, hands, fingers): None, normal Lower (legs, knees, ankles, toes): None, normal, Trunk Movements Neck, shoulders, hips: None, normal, Overall Severity Severity of abnormal movements (highest score from questions above): None, normal Incapacitation due to  abnormal movements: None, normal Patient's awareness of abnormal movements (rate only patient's report): No Awareness, Dental Status Current problems with teeth and/or dentures?: Yes Does patient usually wear dentures?: No  CIWA:    COWS:     Treatment Plan Summary: Daily contact with patient to assess and evaluate symptoms and progress in treatment Medication management  Plan: 1. Continue crisis management and stabilization.  2. Medication management: Reviewed with patient who stated no untoward effects. - Continue Abilify to 7.5 mg daily for psychosis. - Continue Zoloft to 150 mg daily for depressive symptoms.  -Continue Zyprexa Zydis 10 mg every eight hours prn psychosis.  3. Encouraged patient to attend groups and participate in group counseling sessions and activities.  4. Discharge plan in progress. Anticipate d/c to [facility] on 12/28/13.  5. Continue current treatment plan.  6. Address health issues:  Continue Vasotec 20 mg/HCTZ 12.5 mg daily for elevated blood pressure. Monitoring CBG's which are showing improvement on Metformin 1,000 mg BID, Novlolog resistant SSI and 70/30 42 units BID.  Medical Decision Making Problem Points:  Established problem, improving (2), Review of last therapy session (1) and Review of psycho-social stressors (1) Data Points:  Review or order clinical lab tests (1) Review or order medicine tests (1) Review of medication regiment & side effects (2) Review of new medications or change in dosage (2)  I certify that inpatient services furnished can reasonably be expected to improve the patient's condition.   Fransisca KaufmannDAVIS, LAURA, NP-C 12/27/2013, 11:23 AM   Patient seen, evaluated and I agree with notes by Nurse Practitioner. Thedore MinsMojeed Aamya Orellana, MD

## 2013-12-27 NOTE — Progress Notes (Signed)
Patient ID: Coralie KeensLarry B Barnett, male   DOB: 12/23/1955, 58 y.o.   MRN: [id number] D: Pt. Lying in bed eyes closed, respirations even. A: Writer observed for s/s of distress. Staff will monitor q7315min for safety. R: pt. Is safe on the unit, no distress noted.

## 2013-12-27 NOTE — Progress Notes (Signed)
Adult Psychoeducational Group Note  Date:  12/27/2013 Time:  9:44 PM  Group Topic/Focus:  Wrap-Up Group:   The focus of this group is to help patients review their daily goal of treatment and discuss progress on daily workbooks.  Participation Level:  Minimal  Participation Quality:  Appropriate  Affect:  Flat  Cognitive:  Appropriate  Insight: Limited  Engagement in Group:  Limited  Modes of Intervention:  Education, Exploration, Socialization and Support  Additional Comments:  Patient attended and participated in group tonight. He reports having a good day. He was emotional today in one of the groups. He eat his meals which was brought back to the unit to him. For him, wellness is when he is doing things positive.  Lita MainsFrancis, Abigale Dorow [facility] 12/27/2013, 9:44 PM

## 2013-12-27 NOTE — Progress Notes (Signed)
[facility] :  Will you be returning to the same living situation after discharge: No. At discharge, do you have transportation home?:Yes,  bus pass, then [facility] will pick up Do you have the ability to pay for your medications:Yes,  MCD  Release of information consent forms completed and in the chart;  Patient's signature needed at discharge.  Patient to Follow up at: Follow-up Information   [facility] [place]  On [facility]. ( [facility] between 8am-11am for your hosptial follow up appointment.  )    Contact information:   [address] [place], [place] [address] [phone]       [facility]  [facility] 12/[facility]/2013. (Admission into [facility] on Tuesday morning at 8AM.  You must also have a valid ID [facility] address before admission. )    Contact information:   [address] [place], [place] [address] [phone]      Schedule an appointment as soon as possible [facility] (When needing follow up before [facility] appointment, please make contact with [facility]. You may walk in 8am-3pm Monday-Friday.  The earlier you arrive the shorter your wait time will be.  This will be for medicaiton follow up. )    Contact information:   [address] [place], [place] [address] [phone]      Patient denies SI/HI:   Yes,  yes    Safety Planning and Suicide Prevention discussed:  Yes,  yes  Ida Rogueorth, Edin Skarda B 12/27/2013, 5:31 PM

## 2013-12-27 NOTE — Progress Notes (Signed)
Adult Psychoeducational Group Note  Date:  12/27/2013 Time:  11:01 AM  Group Topic/Focus:  Wellness Toolbox:   The focus of this group is to discuss various aspects of wellness, balancing those aspects and exploring ways to increase the ability to experience wellness.  Patients will create a wellness toolbox for use upon discharge.  Participation Level:  Active  Participation Quality:  Appropriate  Affect:  Appropriate  Cognitive:  Appropriate  Insight: Appropriate  Engagement in Group:  Engaged  Modes of Intervention:  Discussion  Additional Comments:    Geffrey Michaelsen A 12/27/2013, 11:01 AM 

## 2013-12-27 NOTE — [facility] Group Notes (Signed)
[facility] LCSW Group Therapy  12/27/2013 1:15 pm  Type of Therapy: Process Group Therapy  Participation Level:  Active  Participation Quality:  Appropriate  Affect:  Flat  Cognitive:  Oriented  Insight:  Improving  Engagement in Group:  Limited  Engagement in Therapy:  Limited  Modes of Intervention:  Activity, Clarification, Education, Problem-solving and Support  Summary of Progress/Problems: Today's group addressed the issue of overcoming obstacles.  Patients were asked to identify their biggest obstacle post d/c that stands in the way of their on-going success, and then problem solve as to how to manage this.  Declared that his biggest obstacle to his continued well being is figuring out how to adjust to society after living in prison for 39 years.  Went on a long speech in preaching fashion.  Embedded were themes of taking responsibility for self, not blaming others and doing one's best.  His contributions affected the rest of the group positively, and caused others to drop their themes of "poor me" and blame.  [name], [name] B 12/27/2013   4:32 PM

## 2013-12-27 NOTE — Tx Team (Signed)
  Interdisciplinary Treatment Plan Update   Date Reviewed:  12/27/2013  Time Reviewed:  5:13 PM  Progress in Treatment:   Attending groups: Yes Participating in groups: Yes Taking medication as prescribed: Yes  Tolerating medication: Yes Family/Significant other contact made: Yes  Patient understands diagnosis: Yes  Discussing patient identified problems/goals with staff: Yes Medical problems stabilized or resolved: Yes Denies suicidal/homicidal ideation: Yes Patient has not harmed self or others: Yes  For review of initial/current patient goals, please see plan of care.  Estimated Length of Stay:  D/C tomorrow AM at 6AM  Reason for Continuation of Hospitalization:   New Problems/Goals identified:  N/A  Discharge Plan or Barriers:   [facility]  Additional Comments:  Attendees:  Signature: Thedore MinsMojeed Akintayo, MD 12/27/2013 5:13 PM   Signature: Richelle Itood Hargun Spurling, LCSW 12/27/2013 5:13 PM  Signature: Fransisca KaufmannLaura Davis, NP 12/27/2013 5:13 PM  Signature: Joslyn Devonaroline Beaudry, RN 12/27/2013 5:13 PM  Signature: Liborio NixonPatrice White, RN 12/27/2013 5:13 PM  Signature:  12/27/2013 5:13 PM  Signature:   12/27/2013 5:13 PM  Signature:    Signature:    Signature:    Signature:    Signature:    Signature:      Scribe for Treatment Team:   Richelle Itood Liliahna Cudd, LCSW  12/27/2013 5:13 PM

## 2013-12-27 NOTE — Progress Notes (Signed)
Patient ID: Justin KeensLarry B Delacruz, male   DOB: 04/27/1956, 58 y.o.   MRN: [id number] D- Patient reports he slept well and his appetite is good.  His energy level is normal and his ability to pay attention is good.  He is rating his depression at 3/10 and hopelessness at 3/10,  He denies thoughts of self harm . A- Suppodrted patient.  R- He is planning for discharge tomorrow .  He continues to have gout pain in his ankles but has been an active participant in unit activities

## 2013-12-28 LAB — GLUCOSE, CAPILLARY: Glucose-Capillary: 172 mg/dL — ABNORMAL HIGH (ref 70–99)

## 2013-12-28 NOTE — Progress Notes (Signed)
Patient ID: Justin KeensLarry B Dames, male   DOB: 08/03/1956, 58 y.o.   MRN: [id number] D: Pt. Visible on the unit in dayroom, interacting on the phone and with other clients and staff. Pt. Reports some anxiety about [facility], "But I'm willing to do what it takes"  Pt. Denies SHI. A: Writer encouraged pt. To look forward to the future, use coping skills and continue medications. Staff will monitor q1315min for safety. R: Pt. Is safe on the unit.

## 2013-12-28 NOTE — Progress Notes (Addendum)
Patient ID: Coralie KeensLarry B Behunin, male   DOB: 04/03/1956, 58 y.o.   MRN: [id number] D: pt. Reports a little anxiety but ready to get a new start. Pt. Discharge to go to [facility], will travel there by bus. Pt. Denies SHI. A: Writer reviewed discharge instructions,transportation, bus ticket given, retrieved items from locker and gave pt sandwich/chips. R: pt. Affect bright, smiling. Pt. Walked off unit in foyer by Clinical research associatewriter.
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
## 2013-12-31 NOTE — Progress Notes (Signed)
Patient Discharge Instructions:  After Visit Summary (AVS):   Faxed to:  12/31/13 Discharge Summary Note:   Faxed to:  12/31/13 Psychiatric Admission Assessment Note:   Faxed to:  12/31/13 Suicide Risk Assessment - Discharge Assessment:   Faxed to:  12/31/13 Faxed/Sent to the Next Level Care provider:  12/31/13 [facility] to [facility] @ [phone] [facility]ed to [facility] @ [phone] F[facility] to [facility] @ [phone]  Jerelene ReddenSheena E Waseca, 12/31/2013, 3:12 PM

## 2014-01-24 ENCOUNTER — Encounter (HOSPITAL_COMMUNITY): Payer: Self-pay | Admitting: Emergency Medicine

## 2014-01-24 ENCOUNTER — Emergency Department (HOSPITAL_COMMUNITY)
Admission: EM | Admit: 2014-01-24 | Discharge: 2014-01-26 | Disposition: A | Discharge status: Home / Self Care | Payer: Medicaid Other | Attending: Emergency Medicine | Admitting: Emergency Medicine

## 2014-01-24 ENCOUNTER — Emergency Department (HOSPITAL_COMMUNITY): Payer: Medicaid Other

## 2014-01-24 DIAGNOSIS — F29 Unspecified psychosis not due to a substance or known physiological condition: Secondary | ICD-10-CM

## 2014-01-24 DIAGNOSIS — E1100 Type 2 diabetes mellitus with hyperosmolarity without nonketotic hyperglycemic-hyperosmolar coma (NKHHC): Secondary | ICD-10-CM

## 2014-01-24 DIAGNOSIS — F209 Schizophrenia, unspecified: Secondary | ICD-10-CM | POA: Insufficient documentation

## 2014-01-24 DIAGNOSIS — F1410 Cocaine abuse, uncomplicated: Secondary | ICD-10-CM | POA: Insufficient documentation

## 2014-01-24 DIAGNOSIS — Z87891 Personal history of nicotine dependence: Secondary | ICD-10-CM | POA: Insufficient documentation

## 2014-01-24 DIAGNOSIS — E1165 Type 2 diabetes mellitus with hyperglycemia: Secondary | ICD-10-CM | POA: Diagnosis present

## 2014-01-24 DIAGNOSIS — R197 Diarrhea, unspecified: Secondary | ICD-10-CM | POA: Insufficient documentation

## 2014-01-24 DIAGNOSIS — Y939 Activity, unspecified: Secondary | ICD-10-CM | POA: Insufficient documentation

## 2014-01-24 DIAGNOSIS — S298XXA Other specified injuries of thorax, initial encounter: Secondary | ICD-10-CM | POA: Insufficient documentation

## 2014-01-24 DIAGNOSIS — IMO0001 Reserved for inherently not codable concepts without codable children: Secondary | ICD-10-CM | POA: Insufficient documentation

## 2014-01-24 DIAGNOSIS — F14259 Cocaine dependence with cocaine-induced psychotic disorder, unspecified: Secondary | ICD-10-CM

## 2014-01-24 DIAGNOSIS — IMO0002 Reserved for concepts with insufficient information to code with codable children: Secondary | ICD-10-CM | POA: Diagnosis present

## 2014-01-24 DIAGNOSIS — Y9289 Other specified places as the place of occurrence of the external cause: Secondary | ICD-10-CM | POA: Insufficient documentation

## 2014-01-24 DIAGNOSIS — R079 Chest pain, unspecified: Secondary | ICD-10-CM

## 2014-01-24 DIAGNOSIS — I252 Old myocardial infarction: Secondary | ICD-10-CM | POA: Insufficient documentation

## 2014-01-24 DIAGNOSIS — M109 Gout, unspecified: Secondary | ICD-10-CM | POA: Insufficient documentation

## 2014-01-24 DIAGNOSIS — R739 Hyperglycemia, unspecified: Secondary | ICD-10-CM

## 2014-01-24 DIAGNOSIS — I2510 Atherosclerotic heart disease of native coronary artery without angina pectoris: Secondary | ICD-10-CM

## 2014-01-24 DIAGNOSIS — Z91199 Patient's noncompliance with other medical treatment and regimen due to unspecified reason: Secondary | ICD-10-CM

## 2014-01-24 DIAGNOSIS — R112 Nausea with vomiting, unspecified: Secondary | ICD-10-CM

## 2014-01-24 DIAGNOSIS — I10 Essential (primary) hypertension: Secondary | ICD-10-CM | POA: Diagnosis present

## 2014-01-24 DIAGNOSIS — Z9861 Coronary angioplasty status: Secondary | ICD-10-CM | POA: Insufficient documentation

## 2014-01-24 DIAGNOSIS — Z794 Long term (current) use of insulin: Secondary | ICD-10-CM | POA: Insufficient documentation

## 2014-01-24 DIAGNOSIS — G8929 Other chronic pain: Secondary | ICD-10-CM | POA: Diagnosis present

## 2014-01-24 DIAGNOSIS — Z9119 Patient's noncompliance with other medical treatment and regimen: Secondary | ICD-10-CM

## 2014-01-24 DIAGNOSIS — F1994 Other psychoactive substance use, unspecified with psychoactive substance-induced mood disorder: Secondary | ICD-10-CM

## 2014-01-24 DIAGNOSIS — R4182 Altered mental status, unspecified: Secondary | ICD-10-CM

## 2014-01-24 DIAGNOSIS — W010XXA Fall on same level from slipping, tripping and stumbling without subsequent striking against object, initial encounter: Secondary | ICD-10-CM | POA: Insufficient documentation

## 2014-01-24 DIAGNOSIS — F329 Major depressive disorder, single episode, unspecified: Secondary | ICD-10-CM | POA: Diagnosis present

## 2014-01-24 DIAGNOSIS — R7309 Other abnormal glucose: Secondary | ICD-10-CM

## 2014-01-24 DIAGNOSIS — Z79899 Other long term (current) drug therapy: Secondary | ICD-10-CM | POA: Insufficient documentation

## 2014-01-24 HISTORY — DX: Schizophrenia, unspecified: F20.9

## 2014-01-24 HISTORY — DX: Bipolar disorder, unspecified: F31.9

## 2014-01-24 LAB — CBC
HEMATOCRIT: 36.2 % — AB (ref 39.0–52.0)
HEMOGLOBIN: 12.5 g/dL — AB (ref 13.0–17.0)
MCH: 29.8 pg (ref 26.0–34.0)
MCHC: 34.5 g/dL (ref 30.0–36.0)
MCV: 86.4 fL (ref 78.0–100.0)
Platelets: 260 10*3/uL (ref 150–400)
RBC: 4.19 MIL/uL — ABNORMAL LOW (ref 4.22–5.81)
RDW: 13.2 % (ref 11.5–15.5)
WBC: 7 10*3/uL (ref 4.0–10.5)

## 2014-01-24 LAB — I-STAT TROPONIN, ED: TROPONIN I, POC: 0.01 ng/mL (ref 0.00–0.08)

## 2014-01-24 LAB — URINALYSIS, ROUTINE W REFLEX MICROSCOPIC
Bilirubin Urine: NEGATIVE
Hgb urine dipstick: NEGATIVE
Ketones, ur: NEGATIVE mg/dL
LEUKOCYTES UA: NEGATIVE
NITRITE: NEGATIVE
PH: 7 (ref 5.0–8.0)
Protein, ur: 30 mg/dL — AB
SPECIFIC GRAVITY, URINE: 1.027 (ref 1.005–1.030)
Urobilinogen, UA: 1 mg/dL (ref 0.0–1.0)

## 2014-01-24 LAB — COMPREHENSIVE METABOLIC PANEL
ALBUMIN: 3.4 g/dL — AB (ref 3.5–5.2)
ALT: 29 U/L (ref 0–53)
AST: 21 U/L (ref 0–37)
Alkaline Phosphatase: 67 U/L (ref 39–117)
BUN: 20 mg/dL (ref 6–23)
CALCIUM: 8.9 mg/dL (ref 8.4–10.5)
CO2: 22 mEq/L (ref 19–32)
Chloride: 98 mEq/L (ref 96–112)
Creatinine, Ser: 1.34 mg/dL (ref 0.50–1.35)
GFR calc Af Amer: 66 mL/min — ABNORMAL LOW (ref >90)
GFR calc non Af Amer: 57 mL/min — ABNORMAL LOW (ref >90)
Glucose, Bld: 583 mg/dL (ref 70–99)
Potassium: 4.4 mEq/L (ref 3.7–5.3)
Sodium: 134 mEq/L — ABNORMAL LOW (ref 137–147)
TOTAL PROTEIN: 7 g/dL (ref 6.0–8.3)
Total Bilirubin: 0.3 mg/dL (ref 0.3–1.2)

## 2014-01-24 LAB — RAPID URINE DRUG SCREEN, HOSP PERFORMED
Amphetamines: NOT DETECTED
Barbiturates: NOT DETECTED
Benzodiazepines: NOT DETECTED
Cocaine: POSITIVE — AB
Opiates: NOT DETECTED
Tetrahydrocannabinol: NOT DETECTED

## 2014-01-24 LAB — CBG MONITORING, ED
GLUCOSE-CAPILLARY: 397 mg/dL — AB (ref 70–99)
GLUCOSE-CAPILLARY: 309 mg/dL — AB (ref 70–99)
GLUCOSE-CAPILLARY: 362 mg/dL — AB (ref 70–99)
GLUCOSE-CAPILLARY: 382 mg/dL — AB (ref 70–99)
GLUCOSE-CAPILLARY: 371 mg/dL — AB (ref 70–99)
Glucose-Capillary: 408 mg/dL — ABNORMAL HIGH (ref 70–99)
Glucose-Capillary: 404 mg/dL — ABNORMAL HIGH (ref 70–99)
Glucose-Capillary: 433 mg/dL — ABNORMAL HIGH (ref 70–99)
Glucose-Capillary: 349 mg/dL — ABNORMAL HIGH (ref 70–99)

## 2014-01-24 LAB — ETHANOL

## 2014-01-24 LAB — SALICYLATE LEVEL

## 2014-01-24 LAB — URINE MICROSCOPIC-ADD ON

## 2014-01-24 LAB — ACETAMINOPHEN LEVEL

## 2014-01-24 MED ORDER — OXYCODONE-ACETAMINOPHEN 5-325 MG PO TABS
2.0000 | ORAL_TABLET | Freq: Once | ORAL | Status: AC
Start: 2014-01-24 — End: 2014-01-24
  Administered 2014-01-24: 2 via ORAL
  Filled 2014-01-24: qty 2

## 2014-01-24 MED ORDER — ACETAMINOPHEN 325 MG PO TABS
650.0000 mg | ORAL_TABLET | Freq: Four times a day (QID) | ORAL | Status: DC | PRN
  Administered 2014-01-25 – 2014-01-26 (×3): 650 mg via ORAL
  Filled 2014-01-24 (×3): qty 2

## 2014-01-24 MED ORDER — INSULIN ASPART 100 UNIT/ML ~~LOC~~ SOLN
0.0000 [IU] | Freq: Three times a day (TID) | SUBCUTANEOUS | Status: DC
Start: 2014-01-24 — End: 2014-01-24
  Administered 2014-01-24: 9 [IU] via SUBCUTANEOUS
  Filled 2014-01-24: qty 1

## 2014-01-24 MED ORDER — INSULIN ASPART 100 UNIT/ML ~~LOC~~ SOLN
5.0000 [IU] | Freq: Once | SUBCUTANEOUS | Status: AC
  Administered 2014-01-24: 5 [IU] via SUBCUTANEOUS

## 2014-01-24 MED ORDER — INSULIN NPH (HUMAN) (ISOPHANE) 100 UNIT/ML ~~LOC~~ SUSP
30.0000 [IU] | Freq: Two times a day (BID) | SUBCUTANEOUS | Status: DC
  Administered 2014-01-24: 30 [IU] via SUBCUTANEOUS
  Filled 2014-01-24: qty 10

## 2014-01-24 MED ORDER — HYDROCHLOROTHIAZIDE 12.5 MG PO CAPS
12.5000 mg | ORAL_CAPSULE | Freq: Every day | ORAL | Status: DC
  Administered 2014-01-24 – 2014-01-26 (×3): 12.5 mg via ORAL
  Filled 2014-01-24 (×4): qty 1

## 2014-01-24 MED ORDER — COLCHICINE 0.6 MG PO TABS
0.6000 mg | ORAL_TABLET | Freq: Two times a day (BID) | ORAL | Status: DC
  Administered 2014-01-24 – 2014-01-26 (×5): 0.6 mg via ORAL
  Filled 2014-01-24 (×8): qty 1

## 2014-01-24 MED ORDER — SODIUM CHLORIDE 0.9 % IV SOLN
INTRAVENOUS | Status: DC
  Administered 2014-01-24: 3.2 [IU]/h via INTRAVENOUS
  Filled 2014-01-24: qty 1

## 2014-01-24 MED ORDER — INSULIN ASPART 100 UNIT/ML ~~LOC~~ SOLN
0.0000 [IU] | Freq: Three times a day (TID) | SUBCUTANEOUS | Status: DC
  Administered 2014-01-24 – 2014-01-25 (×2): 15 [IU] via SUBCUTANEOUS
  Administered 2014-01-25: 11 [IU] via SUBCUTANEOUS
  Administered 2014-01-25 – 2014-01-26 (×2): 8 [IU] via SUBCUTANEOUS
  Administered 2014-01-26: 5 [IU] via SUBCUTANEOUS
  Filled 2014-01-24 (×6): qty 1

## 2014-01-24 MED ORDER — SODIUM CHLORIDE 0.9 % IV SOLN
1000.0000 mL | INTRAVENOUS | Status: DC
  Administered 2014-01-24 (×2): 1000 mL via INTRAVENOUS

## 2014-01-24 MED ORDER — METFORMIN HCL 500 MG PO TABS
1000.0000 mg | ORAL_TABLET | Freq: Two times a day (BID) | ORAL | Status: DC
  Filled 2014-01-24 (×2): qty 2

## 2014-01-24 MED ORDER — INSULIN ASPART 100 UNIT/ML ~~LOC~~ SOLN
8.0000 [IU] | Freq: Once | SUBCUTANEOUS | Status: DC
  Filled 2014-01-24: qty 1

## 2014-01-24 MED ORDER — ENALAPRIL MALEATE 20 MG PO TABS
20.0000 mg | ORAL_TABLET | Freq: Every day | ORAL | Status: DC
  Administered 2014-01-24 – 2014-01-26 (×3): 20 mg via ORAL
  Filled 2014-01-24 (×4): qty 1

## 2014-01-24 MED ORDER — INSULIN NPH (HUMAN) (ISOPHANE) 100 UNIT/ML ~~LOC~~ SUSP
40.0000 [IU] | Freq: Two times a day (BID) | SUBCUTANEOUS | Status: DC
  Administered 2014-01-24 – 2014-01-25 (×2): 40 [IU] via SUBCUTANEOUS
  Filled 2014-01-24: qty 10

## 2014-01-24 MED ORDER — SODIUM CHLORIDE 0.9 % IV SOLN
1000.0000 mL | Freq: Once | INTRAVENOUS | Status: AC
Start: 2014-01-24 — End: 2014-01-24
  Administered 2014-01-24: 1000 mL via INTRAVENOUS

## 2014-01-24 MED ORDER — ALLOPURINOL 100 MG PO TABS
100.0000 mg | ORAL_TABLET | Freq: Every day | ORAL | Status: DC
  Administered 2014-01-24 – 2014-01-26 (×3): 100 mg via ORAL
  Filled 2014-01-24 (×4): qty 1

## 2014-01-24 MED ORDER — ARIPIPRAZOLE 15 MG PO TABS
7.5000 mg | ORAL_TABLET | Freq: Every day | ORAL | Status: DC
  Administered 2014-01-24 – 2014-01-25 (×2): 7.5 mg via ORAL
  Filled 2014-01-24 (×4): qty 1

## 2014-01-24 NOTE — ED Notes (Signed)
Patient is resting comfortably. 

## 2014-01-24 NOTE — ED Notes (Signed)
Pt became agitated about the wait process. Stated that he has some important concern to address in regards to his disability. He has threatened to pull out his IV and leave the facility. [facility]  CSW and MD aware.
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
## 2014-01-24 NOTE — Progress Notes (Signed)
   CARE MANAGEMENT ED NOTE 01/24/2014  Patient:  [name],[name]   Account Number:  [id number]  Date Initiated:  01/24/2014  Documentation initiated by:  Edd ArbourGIBBS,KIMBERLY  Subjective/Objective Assessment:   57 yr [place]  [place] access [place] without pcp listed  No scanned medicaid card in EPIC to indicate pcp name     Subjective/Objective Assessment Detail:   Pt asleep when Cm went to see pt at 1100  Pt awake at 1324 an states he does not have a pcp     Action/Plan:   Cm provided pt with a list of [place] accepting providers in [place]   Action/Plan Detail:   Pt encouraged to Please contact the Dept of Social services case worker to have a local medicaid accepting primary care doctor entered pror to going to one from list   Anticipated DC Date:       Status Recommendation to Physician:   Result of Recommendation:    Other ED Services  Consult Working Psychologist, educationallan    DC Planning Services  Other  Outpatient Services - Pt will follow up  PCP issues    Choice offered to / List presented to:            Status of service:  Completed, signed off  ED Comments:   ED Comments Detail:

## 2014-01-24 NOTE — ED Notes (Signed)
Psych ED wants us to check patient's CBG again before bringing him over to make sure it is trending down.

## 2014-01-24 NOTE — ED Notes (Addendum)
Pt again agitated that he has to stay per protocol 24 hours in holding [facility]. Protocol verified Pt is stating he is going to leave. MD and  charge nurse.   Pt is aware of the POC.

## 2014-01-24 NOTE — ED Notes (Signed)
Patient cleared medically by internist to go to psych unit.  Charge nurse notified.

## 2014-01-24 NOTE — ED Notes (Signed)
Patient transported to X-ray 

## 2014-01-24 NOTE — Consult Note (Signed)
[facility] Medical Consultation  Coralie KeensLarry B Vanhise ZHG:[id number] DOB: 10/02/1956 DOA: 01/24/2014 PCP: No PCP Per Patient   Requesting physician:  Date of consultation:  Reason for consultation: hyperglycemia   Impression/Recommendations Active Problems:   Major depression, chronic   Chronic pain   Hypertension   Noncompliance   DM (diabetes mellitus), type 2, uncontrolled  58 y/o male with PMH of HTN, HPL, IDDM non compliance to medications, schizophrenia history of schizophrenia he has been hearing voices. Voices occasionally tell him to hurt himself or hurt someone else. hoispitralist was consulted for hyperglycemia  1. IDDM with hyperglycemia due to non compliance; patient was not taking meds at home  -no s/s of HHS or DKA; hyperglycemia improved on IVF, insulin regimen  -HA1C->10.0 likely due to ongoing noncomplaince  -resume NPH 70/30 BID 40 units, add ISS; titrate as needed outpatient; hold metformin due to intermittent AKI;  reemphasized medication adherence  2. HTN not at goal due to non compliance  -resume home meds; titrate outpatient as needed   Patient is stable for inpatient psychiatry admission;     Please contact me if I can be of assistance in the meanwhile. Thank you for this consultation.  Chief Complaint: hyperglycemia   HPI:  58 y/o male with PMH of HTN, HPL, IDDM non compliance to medications, schizophrenia history of schizophrenia he has been hearing voices. Voices occasionally tell him to hurt himself or hurt someone else. hoispitralist was consulted for hyperglycemia; patient was seen by ED started on insulin regimen, IVF; hyperglycemia improved   -patient reports not taking medications, including insulin for several days; denies polyuria, or polydipsia; no fever, no chest pain, no focal neurological symptoms;       Review of Systems:  Review of Systems  Constitutional: Negative for fever, chills and weight loss.  HENT: Negative for congestion and  sore throat.   Eyes: Negative for blurred vision.  Respiratory: Negative for cough, hemoptysis and sputum production.   Cardiovascular: Negative for chest pain, palpitations, orthopnea, claudication and leg swelling.  Gastrointestinal: Negative for nausea, vomiting, diarrhea and constipation.  Genitourinary: Negative for dysuria and urgency.  Musculoskeletal: Negative for myalgias.  Skin: Negative for rash.  Neurological: Negative for dizziness, sensory change, speech change, focal weakness, seizures, loss of consciousness, weakness and headaches.  Psychiatric/Behavioral: Positive for hallucinations and substance abuse.     Past Medical History  Diagnosis Date  . Diabetes mellitus   . Hypertension   . Gout   . MI (myocardial infarction)   . Chronic pain     right elbow  . Major depression, chronic   . Bipolar disorder   . Schizophrenia    Past Surgical History  Procedure Laterality Date  . Coronary angioplasty with stent placement      2008  . Knee surgery      both   Social History:  reports that he quit smoking about 12 months ago. He has never used smokeless tobacco. He reports that he uses illicit drugs (Cocaine). He reports that he does not drink alcohol.  Allergies  Allergen Reactions  . Fish Allergy     All seafood makes his throat swell  . Tomato     Cannot breathe  . Lactose Intolerance (Gi)    Family History  Problem Relation Age of Onset  . Diabetic kidney disease Mother   . Hypertension Mother   . Gout Mother   . Diabetic kidney disease Father   . Heart attack Brother 37    Prior  to Admission medications   Medication Sig Start Date End Date Taking? Authorizing Provider  ARIPiprazole (ABILIFY) 15 MG tablet Take 0.5 tablets (7.5 mg total) by mouth daily at 10 pm. 12/27/13  Yes Fransisca Kaufmann, NP  colchicine 0.6 MG tablet Take 1 tablet (0.6 mg total) by mouth 2 (two) times daily. 12/27/13  Yes Fransisca Kaufmann, NP  enalapril (VASOTEC) 20 MG tablet Take 1 tablet (20  mg total) by mouth daily. 12/27/13  Yes Fransisca Kaufmann, NP  hydrochlorothiazide (MICROZIDE) 12.5 MG capsule Take 1 capsule (12.5 mg total) by mouth daily. 12/27/13  Yes Fransisca Kaufmann, NP  insulin NPH-regular Human (NOVOLIN 70/30) (70-30) 100 UNIT/ML injection Inject 40 Units into the skin 2 (two) times daily with a meal. 12/27/13  Yes Fransisca Kaufmann, NP  insulin regular (NOVOLIN R,HUMULIN R) 100 units/mL injection Inject 0.02-0.15 mLs (2-15 Units total) into the skin 3 (three) times daily before meals. Sliding scale insulin 12/27/13  Yes Fransisca Kaufmann, NP  metFORMIN (GLUCOPHAGE) 1000 MG tablet Take 1 tablet (1,000 mg total) by mouth 2 (two) times daily with a meal. 12/27/13  Yes Fransisca Kaufmann, NP  sertraline (ZOLOFT) 50 MG tablet Take 50 mg by mouth daily.   Yes Historical Provider, MD  traZODone (DESYREL) 25 mg TABS tablet Take 0.5 tablets (25 mg total) by mouth at bedtime. 12/27/13  Yes Fransisca Kaufmann, NP  allopurinol (ZYLOPRIM) 100 MG tablet Take 1 tablet (100 mg total) by mouth daily. 12/27/13   Fransisca Kaufmann, NP   Physical Exam: Blood pressure 162/93, pulse 85, temperature 98.9 F (37.2 C), temperature source Oral, resp. rate 16, SpO2 96.00%. Filed Vitals:   01/24/14 1245  BP: 162/93  Pulse:   Temp:   Resp: 16     General:  alert  Eyes: EOM-i  ENT: no oral ulcers   Neck: supple   Cardiovascular: s1,s2 rrr  Respiratory: CTA BL  Abdomen: soft, nt,nd   Skin: no rash  Musculoskeletal: no LE edema  Psychiatric: hallucinations +  Neurologic: CN 2-12 intact; motor 5/5 BL  Labs on Admission:  Basic Metabolic Panel:  Recent Labs Lab 01/24/14 0659  NA 134*  K 4.4  CL 98  CO2 22  GLUCOSE 583*  BUN 20  CREATININE 1.34  CALCIUM 8.9   Liver Function Tests:  Recent Labs Lab 01/24/14 0659  AST 21  ALT 29  ALKPHOS 67  BILITOT 0.3  PROT 7.0  ALBUMIN 3.4*   No results found for this basename: LIPASE, AMYLASE,  in the last 168 hours No results found for this basename: AMMONIA,  in the last  168 hours CBC:  Recent Labs Lab 01/24/14 0659  WBC 7.0  HGB 12.5*  HCT 36.2*  MCV 86.4  PLT 260   Cardiac Enzymes: No results found for this basename: CKTOTAL, CKMB, CKMBINDEX, TROPONINI,  in the last 168 hours BNP: No components found with this basename: POCBNP,  CBG:  Recent Labs Lab 01/24/14 0632 01/24/14 0821 01/24/14 1018 01/24/14 1204  GLUCAP >600* 397* 309* 362*    Radiological Exams on Admission: Dg Ribs Unilateral W/chest Left  01/24/2014   CLINICAL DATA:  Pain post trauma  EXAM: LEFT RIBS AND CHEST - 3+ VIEW  COMPARISON:  Chest radiograph April 21, 2013  FINDINGS: Chest radiograph as well as bilateral oblique and cone-down lower rib images were obtained. Lungs are clear. Heart is upper normal in size with normal pulmonary vascularity. No adenopathy. There is no effusion or pneumothorax. There is no apparent rib fracture.  IMPRESSION:  No apparent rib fracture.  Lungs clear.   Electronically Signed   By: Bretta Bang M.D.   On: 01/24/2014 07:46    EKG: Independently reviewed.   Time spent: >35 minutes   Esperanza Sheets [facility] Pager [phone]  If 7PM-7AM, please contact night-coverage [email or web address] Password [email or web address] 01/24/2014, 4:30 PM

## 2014-01-24 NOTE — Progress Notes (Signed)
CSW spoke with patient at bedside to explain [facility].  Patient acknowledges the senstivity of his medical stability and the affect its having on his admission.  Patient expressed his appreciate for the CSW to explain the situation and he agreed to try to stay calm and await the transfer.      Maryelizabeth Rowanressa Chrisoula Zegarra, MSW, [place], 01/24/2014 Evening Clinical Social Worker [phone]

## 2014-01-24 NOTE — ED Notes (Signed)
[facility] pt needs [facility].

## 2014-01-24 NOTE — ED Notes (Signed)
Dr. York SpanielBuriev notified of patient's CBG result of 433.  Orders placed.  MD reported it will take a while to get his blood sugars down but patient is not in DKA and eligible for admission to psych.

## 2014-01-24 NOTE — ED Provider Notes (Addendum)
CSN: [id number]     Arrival date & time 01/24/14  0548 History   First MD Initiated Contact with Patient 01/24/14 0703     Chief Complaint  Patient presents with  . Medical Clearance  . Fall    HPI Pt states he rolled down an embankment sat night.  Since then he has been having pain in his left rib cage.  It is a severe pain. Hurts to take a deep breath.  Patient states the pain also increases whenever someone presses on his left side. He denies any vomiting. He denies any abdominal pain. He has had some trouble with occasional loose stools. He denies any fevers or coughing. Patient also has a history of schizophrenia. He has been hearing voices. Voices occasionally tell him to hurt himself or hurt someone else. Patient does not feel suicidal and does not want to harm anyone.  Patient did use crack a few days ago.  Past Medical History  Diagnosis Date  . Diabetes mellitus   . Hypertension   . Gout   . MI (myocardial infarction)   . Chronic pain     right elbow  . Major depression, chronic   . Bipolar disorder   . Schizophrenia    Past Surgical History  Procedure Laterality Date  . Coronary angioplasty with stent placement      2008  . Knee surgery      both   Family History  Problem Relation Age of Onset  . Diabetic kidney disease Mother   . Hypertension Mother   . Gout Mother   . Diabetic kidney disease Father   . Heart attack Brother 37   History  Substance Use Topics  . Smoking status: Former Smoker    Quit date: 01/07/2013  . Smokeless tobacco: Never Used  . Alcohol Use: No    Review of Systems  All other systems reviewed and are negative.      Allergies  Fish allergy; Tomato; and Lactose intolerance (gi)  Home Medications   Current Outpatient Rx  Name  Route  Sig  Dispense  Refill  . allopurinol (ZYLOPRIM) 100 MG tablet   Oral   Take 1 tablet (100 mg total) by mouth daily.   30 tablet   0   . ARIPiprazole (ABILIFY) 15 MG tablet   Oral   Take 0.5  tablets (7.5 mg total) by mouth daily at 10 pm.   30 tablet   0   . colchicine 0.6 MG tablet   Oral   Take 1 tablet (0.6 mg total) by mouth 2 (two) times daily.   60 tablet   0   . enalapril (VASOTEC) 20 MG tablet   Oral   Take 1 tablet (20 mg total) by mouth daily.         . hydrochlorothiazide (MICROZIDE) 12.5 MG capsule   Oral   Take 1 capsule (12.5 mg total) by mouth daily.         . insulin NPH-regular Human (NOVOLIN 70/30) (70-30) 100 UNIT/ML injection   Subcutaneous   Inject 40 Units into the skin 2 (two) times daily with a meal.   10 mL   11   . insulin regular (NOVOLIN R,HUMULIN R) 100 units/mL injection   Subcutaneous   Inject 0.02-0.15 mLs (2-15 Units total) into the skin 3 (three) times daily before meals. Sliding scale insulin   10 mL   11   . metFORMIN (GLUCOPHAGE) 1000 MG tablet   Oral   Take  1 tablet (1,000 mg total) by mouth 2 (two) times daily with a meal.         . traZODone (DESYREL) 25 mg TABS tablet   Oral   Take 0.5 tablets (25 mg total) by mouth at bedtime.   30 tablet   0    BP 172/93  Pulse 99  Temp(Src) 98.7 F (37.1 C) (Oral)  Resp 24  SpO2 96% Physical Exam  Nursing note and vitals reviewed. Constitutional: No distress.  Somnolent but  HENT:  Head: Normocephalic and atraumatic.  Right Ear: External ear normal.  Left Ear: External ear normal.  Eyes: Conjunctivae are normal. Right eye exhibits no discharge. Left eye exhibits no discharge. No scleral icterus.  Neck: Neck supple. No tracheal deviation present.  Cardiovascular: Normal rate, regular rhythm and intact distal pulses.   Pulmonary/Chest: Effort normal and breath sounds normal. No stridor. No respiratory distress. He has no wheezes. He has no rales. He exhibits tenderness (left-sided) and bony tenderness.  Abdominal: Soft. Bowel sounds are normal. He exhibits no distension. There is no tenderness. There is no rebound and no guarding.  Musculoskeletal: He exhibits no  edema and no tenderness.  Neurological: He is alert. He has normal strength. No cranial nerve deficit (no facial droop, extraocular movements intact, no slurred speech) or sensory deficit. He exhibits normal muscle tone. He displays no seizure activity. Coordination normal.  Skin: Skin is warm and dry. No rash noted. He is not diaphoretic.  Psychiatric: He has a normal mood and affect.    ED Course  Procedures (including critical care time) Labs Review Labs Reviewed  CBC - Abnormal; Notable for the following:    RBC 4.19 (*)    Hemoglobin 12.5 (*)    HCT 36.2 (*)    All other components within normal limits  COMPREHENSIVE METABOLIC PANEL - Abnormal; Notable for the following:    Sodium 134 (*)    Glucose, Bld 583 (*)    Albumin 3.4 (*)    GFR calc non Af Amer 57 (*)    GFR calc Af Amer 66 (*)    All other components within normal limits  URINALYSIS, ROUTINE W REFLEX MICROSCOPIC - Abnormal; Notable for the following:    Glucose, UA >1000 (*)    Protein, ur 30 (*)    All other components within normal limits  SALICYLATE LEVEL - Abnormal; Notable for the following:    Salicylate Lvl <2.0 (*)    All other components within normal limits  URINE RAPID DRUG SCREEN (HOSP PERFORMED) - Abnormal; Notable for the following:    Cocaine POSITIVE (*)    All other components within normal limits  CBG MONITORING, ED - Abnormal; Notable for the following:    Glucose-Capillary >600 (*)    All other components within normal limits  CBG MONITORING, ED - Abnormal; Notable for the following:    Glucose-Capillary 397 (*)    All other components within normal limits  ACETAMINOPHEN LEVEL  ETHANOL  URINE MICROSCOPIC-ADD ON  I-STAT TROPOININ, ED   Imaging Review Dg Ribs Unilateral W/chest Left  01/24/2014   CLINICAL DATA:  Pain post trauma  EXAM: LEFT RIBS AND CHEST - 3+ VIEW  COMPARISON:  Chest radiograph April 21, 2013  FINDINGS: Chest radiograph as well as bilateral oblique and cone-down lower rib  images were obtained. Lungs are clear. Heart is upper normal in size with normal pulmonary vascularity. No adenopathy. There is no effusion or pneumothorax. There is no apparent rib fracture.  IMPRESSION: No apparent rib fracture.  Lungs clear.   Electronically Signed   By: Bretta Bang M.D.   On: 01/24/2014 07:46     EKG Interpretation   Date/Time:  Monday January 24 2014 08:27:20 EDT Ventricular Rate:  85 PR Interval:  162 QRS Duration: 87 QT Interval:  367 QTC Calculation: 436 R Axis:   27 Text Interpretation:  Sinus rhythm Left ventricular hypertrophy Borderline  T abnormalities, inferior leads No significant change since last tracing  Confirmed by Heberto Sturdevant  MD-J, Ilia Dimaano ([id number]) on 01/24/2014 8:45:16 AM     Medications  0.9 %  sodium chloride infusion (1,000 mLs Intravenous New Bag/Given 01/24/14 0800)    Followed by  0.9 %  sodium chloride infusion (1,000 mLs Intravenous New Bag/Given 01/24/14 0842)  allopurinol (ZYLOPRIM) tablet 100 mg (not administered)  ARIPiprazole (ABILIFY) tablet 7.5 mg (not administered)  colchicine tablet 0.6 mg (not administered)  enalapril (VASOTEC) tablet 20 mg (not administered)  hydrochlorothiazide (MICROZIDE) capsule 12.5 mg (not administered)  metFORMIN (GLUCOPHAGE) tablet 1,000 mg (not administered)  insulin aspart (novoLOG) injection 0-9 Units (not administered)  1610  Discussed findings with patient a short time ago.  Pt states he is concerned about the voices.  He has been to jail in the past because of violent behavior.  The voices need to stop.  He does not know what he will do.  MDM   Final diagnoses:  Cocaine abuse  DM (diabetes mellitus), type 2, uncontrolled  Hyperglycemia  Schizophrenia    Pt is hyperglycemic.  He has been concompliant.  Normal Bicarb.  No signs of DKA.  Blood sugar has improved with insulin and fluids.  Will start him on his insulin regimen and a sliding scale.  Will continue to need to monitor his blood sugar but no  emergency medical condition.  Pt has had left rib pain since his fall this weekend.  No sign of rib fracture or PTX.  Will ask psych to assess concerning his hallucinations and concern that he is becoming out of control.   Celene Kras, MD 01/24/14 [phone]  Pt is pending psych bed transfer.  Blood sugar is elevated.  Will consult medicine for management recommendations.    Celene Kras, MD 01/24/14 [phone]

## 2014-01-24 NOTE — Consult Note (Signed)
[facility] Face-to-Face Psychiatry Consult   Reason for Consult:  Hearing voices. Referring Physician:  ED physician  [name] is an 58 y.o. male. Total Time spent with patient: 30 minutes  Assessment: AXIS I:  Substance Induced Mood Disorder and Psychosis NOS. R/O Schizoaffective disorder AXIS II:  Deferred AXIS III:   Past Medical History  Diagnosis Date  . Diabetes mellitus   . Hypertension   . Gout   . MI (myocardial infarction)   . Chronic pain     right elbow  . Major depression, chronic   . Bipolar disorder   . Schizophrenia    AXIS IV:  economic problems, housing problems and problems related to social environment AXIS V:  41-50 serious symptoms  Plan:  Recommend psychiatric Inpatient admission when medically cleared.  Subjective:   Justin STROTHERS is a 58 y.o. male patient admitted with pain related to his rolling over and hurting his chest. Also started endorsing hearing voices.  HPI:  Pt states he rolled down an embankment sat night. Since then he has been having pain in his left rib cage. It is a severe pain. Hurts to take a deep breath. Patient states the pain also increases whenever someone presses on his left side. While in ED he started talking about his voices and depression. Has been recently admitted to [facility] and was stabilized with abilify. Says he is not too compliant with meds. Started using cocaine and shows poor judjement that it can make voices worse. Endorsing toughts of harming people no body in particular and that voices are putting him down and he may harm himself.  This appears to be a repeat story of his last admission as per notes review from last admission.  HPI Elements:   Location:  voices. Quality:  moderate. Severity:  recurrent.  Past Psychiatric History: Past Medical History  Diagnosis Date  . Diabetes mellitus   . Hypertension   . Gout   . MI (myocardial infarction)   . Chronic pain     right elbow  . Major depression, chronic   .  Bipolar disorder   . Schizophrenia     reports that he quit smoking about 12 months ago. He has never used smokeless tobacco. He reports that he uses illicit drugs (Cocaine). He reports that he does not drink alcohol. Family History  Problem Relation Age of Onset  . Diabetic kidney disease Mother   . Hypertension Mother   . Gout Mother   . Diabetic kidney disease Father   . Heart attack Brother 37           Allergies:   Allergies  Allergen Reactions  . Fish Allergy     All seafood makes his throat swell  . Tomato     Cannot breathe  . Lactose Intolerance (Gi)     ACT Assessment Complete:  Yes:    Educational Status    Risk to Self: Risk to self Is patient at risk for suicide?: Yes Substance abuse history and/or treatment for substance abuse?: Yes  Risk to Others:    Abuse:    Prior Inpatient Therapy:    Prior Outpatient Therapy:    Additional Information:                    Objective: Blood pressure 178/84, pulse 85, temperature 98.9 F (37.2 C), temperature source Oral, resp. rate 26, SpO2 96.00%.There is no weight on file to calculate BMI. Results for orders placed during the  hospital encounter of 01/24/14 (from the past 72 hour(s))  CBG MONITORING, ED     Status: Abnormal   Collection Time    01/24/14  6:32 AM      Result Value Ref Range   Glucose-Capillary >600 (*) 70 - 99 mg/dL   Comment 1 Notify RN    CBC     Status: Abnormal   Collection Time    01/24/14  6:59 AM      Result Value Ref Range   WBC 7.0  4.0 - 10.5 K/uL   RBC 4.19 (*) 4.22 - 5.81 MIL/uL   Hemoglobin 12.5 (*) 13.0 - 17.0 g/dL   HCT 36.2 (*) 39.0 - 52.0 %   MCV 86.4  78.0 - 100.0 fL   MCH 29.8  26.0 - 34.0 pg   MCHC 34.5  30.0 - 36.0 g/dL   RDW 13.2  11.5 - 15.5 %   Platelets 260  150 - 400 K/uL  COMPREHENSIVE METABOLIC PANEL     Status: Abnormal   Collection Time    01/24/14  6:59 AM      Result Value Ref Range   Sodium 134 (*) 137 - 147 mEq/L   Potassium 4.4  3.7 -  5.3 mEq/L   Chloride 98  96 - 112 mEq/L   CO2 22  19 - 32 mEq/L   Glucose, Bld 583 (*) 70 - 99 mg/dL   Comment: CRITICAL RESULT CALLED TO, READ BACK BY AND VERIFIED WITH:     CYNTHIA CARNEAL RN @ [phone] ON 01/24/2014 BY BOVELL, TERSA.   BUN 20  6 - 23 mg/dL   Creatinine, Ser 1.34  0.50 - 1.35 mg/dL   Calcium 8.9  8.4 - 10.5 mg/dL   Total Protein 7.0  6.0 - 8.3 g/dL   Albumin 3.4 (*) 3.5 - 5.2 g/dL   AST 21  0 - 37 U/L   Comment: NO VISIBLE HEMOLYSIS   ALT 29  0 - 53 U/L   Alkaline Phosphatase 67  39 - 117 U/L   Total Bilirubin 0.3  0.3 - 1.2 mg/dL   GFR calc non Af Amer 57 (*) >90 mL/min   GFR calc Af Amer 66 (*) >90 mL/min   Comment: (NOTE)     The eGFR has been calculated using the CKD EPI equation.     This calculation has not been validated in all clinical situations.     eGFR's persistently <90 mL/min signify possible Chronic Kidney     Disease.  ACETAMINOPHEN LEVEL     Status: None   Collection Time    01/24/14  6:59 AM      Result Value Ref Range   Acetaminophen (Tylenol), Serum <15.0  10 - 30 ug/mL   Comment:            THERAPEUTIC CONCENTRATIONS VARY     SIGNIFICANTLY. A RANGE OF 10-30     ug/mL MAY BE AN EFFECTIVE     CONCENTRATION FOR MANY PATIENTS.     HOWEVER, SOME ARE BEST TREATED     AT CONCENTRATIONS OUTSIDE THIS     RANGE.     ACETAMINOPHEN CONCENTRATIONS     >150 ug/mL AT 4 HOURS AFTER     INGESTION AND >50 ug/mL AT 12     HOURS AFTER INGESTION ARE     OFTEN ASSOCIATED WITH TOXIC     REACTIONS.  ETHANOL     Status: None   Collection Time    01/24/14  6:59  AM      Result Value Ref Range   Alcohol, Ethyl (B) <11  0 - 11 mg/dL   Comment:            LOWEST DETECTABLE LIMIT FOR     SERUM ALCOHOL IS 11 mg/dL     FOR MEDICAL PURPOSES ONLY  SALICYLATE LEVEL     Status: Abnormal   Collection Time    01/24/14  6:59 AM      Result Value Ref Range   Salicylate Lvl <9.4 (*) 2.8 - 20.0 mg/dL  URINALYSIS, ROUTINE W REFLEX MICROSCOPIC     Status: Abnormal    Collection Time    01/24/14  7:02 AM      Result Value Ref Range   Color, Urine YELLOW  YELLOW   APPearance CLEAR  CLEAR   Specific Gravity, Urine 1.027  1.005 - 1.030   pH 7.0  5.0 - 8.0   Glucose, UA >1000 (*) NEGATIVE mg/dL   Hgb urine dipstick NEGATIVE  NEGATIVE   Bilirubin Urine NEGATIVE  NEGATIVE   Ketones, ur NEGATIVE  NEGATIVE mg/dL   Protein, ur 30 (*) NEGATIVE mg/dL   Urobilinogen, UA 1.0  0.0 - 1.0 mg/dL   Nitrite NEGATIVE  NEGATIVE   Leukocytes, UA NEGATIVE  NEGATIVE  URINE RAPID DRUG SCREEN (HOSP PERFORMED)     Status: Abnormal   Collection Time    01/24/14  7:02 AM      Result Value Ref Range   Opiates NONE DETECTED  NONE DETECTED   Cocaine POSITIVE (*) NONE DETECTED   Benzodiazepines NONE DETECTED  NONE DETECTED   Amphetamines NONE DETECTED  NONE DETECTED   Tetrahydrocannabinol NONE DETECTED  NONE DETECTED   Barbiturates NONE DETECTED  NONE DETECTED   Comment:            DRUG SCREEN FOR MEDICAL PURPOSES     ONLY.  IF CONFIRMATION IS NEEDED     FOR ANY PURPOSE, NOTIFY LAB     WITHIN 5 DAYS.                LOWEST DETECTABLE LIMITS     FOR URINE DRUG SCREEN     Drug Class       Cutoff (ng/mL)     Amphetamine      1000     Barbiturate      200     Benzodiazepine   585     Tricyclics       929     Opiates          300     Cocaine          300     THC              50  URINE MICROSCOPIC-ADD ON     Status: None   Collection Time    01/24/14  7:02 AM      Result Value Ref Range   RBC / HPF 0-2  <3 RBC/hpf  I-STAT TROPOININ, ED     Status: None   Collection Time    01/24/14  8:06 AM      Result Value Ref Range   Troponin i, poc 0.01  0.00 - 0.08 ng/mL   Comment 3            Comment: Due to the release kinetics of cTnI,     a negative result within the first hours     of the onset of symptoms  does not rule out     myocardial infarction with certainty.     If myocardial infarction is still suspected,     repeat the test at appropriate intervals.  CBG  MONITORING, ED     Status: Abnormal   Collection Time    01/24/14  8:21 AM      Result Value Ref Range   Glucose-Capillary 397 (*) 70 - 99 mg/dL  CBG MONITORING, ED     Status: Abnormal   Collection Time    01/24/14 10:18 AM      Result Value Ref Range   Glucose-Capillary 309 (*) 70 - 99 mg/dL   Comment 1 Notify RN     Labs are reviewed and are pertinent for cocaine.  Current Facility-Administered Medications  Medication Dose Route Frequency Provider Last Rate Last Dose  . 0.9 %  sodium chloride infusion  1,000 mL Intravenous Continuous Kathalene Frames, MD 125 mL/hr at 01/24/14 0842 1,000 mL at 01/24/14 0842  . acetaminophen (TYLENOL) tablet 650 mg  650 mg Oral Q6H PRN Kathalene Frames, MD      . allopurinol (ZYLOPRIM) tablet 100 mg  100 mg Oral Daily Kathalene Frames, MD   100 mg at 01/24/14 1054  . ARIPiprazole (ABILIFY) tablet 7.5 mg  7.5 mg Oral Q2200 Kathalene Frames, MD      . colchicine tablet 0.6 mg  0.6 mg Oral BID Kathalene Frames, MD   0.6 mg at 01/24/14 1054  . enalapril (VASOTEC) tablet 20 mg  20 mg Oral Daily Kathalene Frames, MD   20 mg at 01/24/14 1055  . hydrochlorothiazide (MICROZIDE) capsule 12.5 mg  12.5 mg Oral Daily Kathalene Frames, MD   12.5 mg at 01/24/14 1055  . insulin aspart (novoLOG) injection 0-9 Units  0-9 Units Subcutaneous TID WC Kathalene Frames, MD      . insulin NPH Human (HUMULIN N,NOVOLIN N) injection 30 Units  30 Units Subcutaneous BID AC & HS Kathalene Frames, MD   30 Units at 01/24/14 1055  . metFORMIN (GLUCOPHAGE) tablet 1,000 mg  1,000 mg Oral BID WC Kathalene Frames, MD       Current Outpatient Prescriptions  Medication Sig Dispense Refill  . ARIPiprazole (ABILIFY) 15 MG tablet Take 0.5 tablets (7.5 mg total) by mouth daily at 10 pm.  30 tablet  0  . colchicine 0.6 MG tablet Take 1 tablet (0.6 mg total) by mouth 2 (two) times daily.  60 tablet  0  . enalapril (VASOTEC) 20 MG tablet Take 1 tablet (20 mg total) by mouth daily.      . hydrochlorothiazide (MICROZIDE) 12.5 MG capsule Take 1  capsule (12.5 mg total) by mouth daily.      . insulin NPH-regular Human (NOVOLIN 70/30) (70-30) 100 UNIT/ML injection Inject 40 Units into the skin 2 (two) times daily with a meal.  10 mL  11  . insulin regular (NOVOLIN R,HUMULIN R) 100 units/mL injection Inject 0.02-0.15 mLs (2-15 Units total) into the skin 3 (three) times daily before meals. Sliding scale insulin  10 mL  11  . metFORMIN (GLUCOPHAGE) 1000 MG tablet Take 1 tablet (1,000 mg total) by mouth 2 (two) times daily with a meal.      . sertraline (ZOLOFT) 50 MG tablet Take 50 mg by mouth daily.      . traZODone (DESYREL) 25 mg TABS tablet Take 0.5 tablets (25 mg total) by mouth at bedtime.  30 tablet  0  .  allopurinol (ZYLOPRIM) 100 MG tablet Take 1 tablet (100 mg total) by mouth daily.  30 tablet  0    Psychiatric Specialty Exam:     Blood pressure 178/84, pulse 85, temperature 98.9 F (37.2 C), temperature source Oral, resp. rate 26, SpO2 96.00%.There is no weight on file to calculate BMI.  General Appearance: Disheveled  Eye Sport and exercise psychologist::  Fair  Speech:  Slow  Volume:  Increased  Mood:  Irritable  Affect:  Labile  Thought Process:  Disorganized  Orientation:  Full (Time, Place, and Person)  Thought Content:  Hallucinations: Auditory Command:  derogatory  Suicidal Thoughts:  Yes.  without intent/plan  Homicidal Thoughts:  Yes.  without intent/plan  Memory:  Recent;   Fair  Judgement:  Poor  Insight:  Lacking  Psychomotor Activity:  Decreased  Concentration:  Fair  Recall:  [place]: Fair  Akathisia:  Negative  Handed:  Right  AIMS (if indicated):     Assets:  Desire for Improvement Leisure Time  Sleep:      Musculoskeletal: Strength & Muscle Tone: within normal limits Gait & Station: normal Patient leans: Front  Treatment Plan Summary: Daily contact with patient to assess and evaluate symptoms and progress in treatment Medication management Admit to Inpatient for stabiilzation  when medically cleared.  Merian Capron MD 01/24/2014 12:05 PM

## 2014-01-24 NOTE — ED Notes (Signed)
MD ordered insulin drip stopped.  Patient to be transferred to behavioral health.

## 2014-01-25 ENCOUNTER — Inpatient Hospital Stay (HOSPITAL_COMMUNITY)
Admission: EM | Admit: 2014-01-25 | Payer: Federal, State, Local not specified - Other | Source: Intra-hospital | Admitting: Psychiatry

## 2014-01-25 ENCOUNTER — Encounter (HOSPITAL_COMMUNITY): Payer: Self-pay | Admitting: Registered Nurse

## 2014-01-25 DIAGNOSIS — F1910 Other psychoactive substance abuse, uncomplicated: Secondary | ICD-10-CM

## 2014-01-25 LAB — CBG MONITORING, ED
GLUCOSE-CAPILLARY: 421 mg/dL — AB (ref 70–99)
GLUCOSE-CAPILLARY: 432 mg/dL — AB (ref 70–99)
GLUCOSE-CAPILLARY: 440 mg/dL — AB (ref 70–99)
GLUCOSE-CAPILLARY: 453 mg/dL — AB (ref 70–99)
Glucose-Capillary: 387 mg/dL — ABNORMAL HIGH (ref 70–99)
Glucose-Capillary: 269 mg/dL — ABNORMAL HIGH (ref 70–99)
Glucose-Capillary: 300 mg/dL — ABNORMAL HIGH (ref 70–99)
Glucose-Capillary: 405 mg/dL — ABNORMAL HIGH (ref 70–99)
Glucose-Capillary: 322 mg/dL — ABNORMAL HIGH (ref 70–99)

## 2014-01-25 MED ORDER — LORAZEPAM 1 MG PO TABS
1.0000 mg | ORAL_TABLET | Freq: Four times a day (QID) | ORAL | Status: DC | PRN
  Administered 2014-01-25: 1 mg via ORAL
  Filled 2014-01-25: qty 1

## 2014-01-25 MED ORDER — METFORMIN HCL 500 MG PO TABS
1000.0000 mg | ORAL_TABLET | Freq: Two times a day (BID) | ORAL | Status: DC
Start: 2014-01-25 — End: 2014-01-26
  Administered 2014-01-25 – 2014-01-26 (×2): 1000 mg via ORAL
  Filled 2014-01-25 (×4): qty 2

## 2014-01-25 MED ORDER — INSULIN NPH (HUMAN) (ISOPHANE) 100 UNIT/ML ~~LOC~~ SUSP
45.0000 [IU] | Freq: Two times a day (BID) | SUBCUTANEOUS | Status: DC
  Administered 2014-01-25 – 2014-01-26 (×2): 45 [IU] via SUBCUTANEOUS

## 2014-01-25 MED ORDER — IBUPROFEN 200 MG PO TABS
600.0000 mg | ORAL_TABLET | Freq: Four times a day (QID) | ORAL | Status: DC | PRN
  Administered 2014-01-25 (×2): 600 mg via ORAL
  Filled 2014-01-25 (×2): qty 3

## 2014-01-25 NOTE — ED Provider Notes (Addendum)
Pt remains in the ED today awaiting psych placement.  He appears stable in no distress.   Some what angry about the blood tests and frequent blood sugar checking.  Blood sugar continues to fluctuate.  200s to 400s.  No vomiting.    Will increase his insulin nph to 45 from 40  Celene KrasJon R Alia Parsley, MD 01/25/14 1007  Celene KrasJon R Galadriel Shroff, MD 01/25/14 1010

## 2014-01-25 NOTE — ED Notes (Signed)
Notified EDP of pt.'s CBG of 421.  New medication orders have been placed. Will continue to monotor pt.

## 2014-01-25 NOTE — ED Provider Notes (Signed)
Glucoses continue to run high, was >400 now.  Pt is not on his usual home glucophage of 1000 mg BID.  Will add that now, and if by tomorrow, glucoses are still unable to be controlled, would consider a medicine consultation to review hsi glucoses for further long term management.    Justin PoundMichael Y. Ormond Lazo, MD 01/25/14 1759
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
## 2014-01-25 NOTE — BH Assessment (Signed)
[facility]. Spoke to AC-Eric this am and he will have nurse's redraw levels in a few hours to determine if patient is stable [facility]ev[facility].

## 2014-01-25 NOTE — Consult Note (Signed)
Face to face evaluation and I agree with this note 

## 2014-01-25 NOTE — ED Notes (Signed)
Patient denies SI, HI at present. States that AH is "is always there but not demanding". Rates anxiety at 4, depression 2. CBG 440. Given Glucophage along with sandwich. Pain left rib currently 2/10, patient reminded of fall precautions.  Encouragement offered. Environment adjusted.   Patient safety maintained, Q 15 checks continue.

## 2014-01-26 DIAGNOSIS — F1410 Cocaine abuse, uncomplicated: Secondary | ICD-10-CM

## 2014-01-26 LAB — CBG MONITORING, ED
GLUCOSE-CAPILLARY: 244 mg/dL — AB (ref 70–99)
Glucose-Capillary: 323 mg/dL — ABNORMAL HIGH (ref 70–99)
Glucose-Capillary: 302 mg/dL — ABNORMAL HIGH (ref 70–99)
Glucose-Capillary: 262 mg/dL — ABNORMAL HIGH (ref 70–99)

## 2014-01-26 MED ORDER — INSULIN ASPART 100 UNIT/ML ~~LOC~~ SOLN
6.0000 [IU] | Freq: Three times a day (TID) | SUBCUTANEOUS | Status: DC
  Administered 2014-01-26: 6 [IU] via SUBCUTANEOUS
  Filled 2014-01-26: qty 1

## 2014-01-26 MED ORDER — INSULIN ASPART PROT & ASPART (70-30 MIX) 100 UNIT/ML ~~LOC~~ SUSP: 6.0000 [IU] | Freq: Three times a day (TID) | SUBCUTANEOUS | Status: DC

## 2014-01-26 NOTE — ED Notes (Signed)
Notified Carline RN of current vital signs.

## 2014-01-26 NOTE — ED Provider Notes (Signed)
Dietitian recommended increasing his NovoLog 6 units 3 times a day with meals and at bedtime if he eats over 50% of his meal.  Justin AlbeIva Albirtha Grinage, MD, Armando GangFACEP   Ward GivensIva L Lorieann Argueta, MD 01/26/14 1234

## 2014-01-26 NOTE — ED Notes (Signed)
D/C instructions reviewed. No prescriptions written. Home insulin returned-placed in belongings bag per his request. Denies pain. No complaints voiced. Belongings bag x1 returned. Ambulatory without difficulty. Taxi cab [place]vou[place]. Escorted by mental health tech to front of hospital.

## 2014-01-26 NOTE — Progress Notes (Signed)
CSW spoke with Wellsite geologistMedical director, who stated that he would manage patient blood sugars [facility]. [facility] would share with charge rn.   CSW received information that patient BP is also a concern. CSW awaiting to speak with medical director regarding medical admission vs. [facility]. CSW consulted RN CM who is discussing with EDP at this time.   Byrd HesselbachKristen Shawneen Deetz, LCSW [phone]  ED CSW 01/26/2014 1057am

## 2014-01-26 NOTE — Progress Notes (Signed)
01/26/14  Clinton GallantKimberly L Kaysan Peixoto, RN, CCM [phone] Received a call from Medical director Re reviewed concerns  Pt [facility] Pt voiced to Cm that his [facility] or to home with great aunt with increase in abilify He [facility] about increase in Abilify today 01/26/14 CM reviewed pt's statements to [facility]

## 2014-01-26 NOTE — Consult Note (Signed)
  Mr Justin StanleyStacey says he is very frustrated with [facility] because he wants  help with his depression.  If not, I'd be better off home, he says.  [facility] of his [facility]

## 2014-01-26 NOTE — ED Notes (Signed)
[facility]Caroline [facility]. Accepted by Dr.Akintayo to room 402-1.

## 2014-01-26 NOTE — Progress Notes (Signed)
01/26/14 1115 Clinton GallantKimberly L Ceejay Kegley, RN, CCM [phone]  ED Cm left Dr Quillian Quince Aronson a voice message reviewing all information below including contact for [facility] Akintayo [phone]) for peer to peer as needed   ED CM consulted by ED SW [facility] CM spoke with SW, [facility] NP, EDP, Lynelle DoctorKnapp after re reviewing pt clinicals in EPIC and about the further recommendations made by DM coordinator on today 01/26/14 She will assist with these recommendations Noted in EPIC pt since admission with BP range 138/76-183/97 HR 84-105 Sats in high 90s temp WNL   cbg values: 01/24/14--397, 309, 362, 408, 433, 404, 382, 371, 349, 01/25/14 387, 269, 300, 405, 322, 421, -nursing reported to Dr Oletta LamasGhim -Pt is not on his usual home glucophage of 1000 mg BID was added- 440, 432, 453, 323, 302,   01/26/14 at 0816 cbg 262 Pt seen by DM coordinator who provided recommendations as following Inpatient Diabetes Program Recommendations Correction (SSI): Increase Novolog to resistant tidwc and hs Insulin - Meal Coverage: Add Novolog 6 units tidwc for meal coverage insulin if pt eats >50% meal HgbA1C: 13.0% - uncontrolled Outpatient Referral: OP Diabetes Education consult for uncontrolled DM Diet: CHO mod med - RN please encourage pt to make healthy choices using portion control in the cafeteria.  Note: According to med rec, pt is on 70/30 40 units bid. Will call pharmacy.

## 2014-01-26 NOTE — ED Notes (Signed)
Notified by Rayfield Citizenaroline RN that he was being reconsidered due to elevated BP and CBG. The docs would discuss it and advise.

## 2014-01-26 NOTE — Consult Note (Signed)
  Mr [name] this afternoon says  He wants to go home.  Denies any wish to hurt himself or anyone else.  Psychiatric Inpatient facilities are reluctant to admit him because of the problems regulating his blood sugar and his hypertension.  He has been here for 4 days and is looking and doing better and wants to go home.  He will stay with his mother for now and made the arrangements in our presence.  Will discharge home today to be followed outpatient.

## 2014-01-26 NOTE — Progress Notes (Signed)
Inpatient Diabetes Program Recommendations  AACE/ADA: New Consensus Statement on Inpatient Glycemic Control (2013)  Target Ranges:  Prepandial:   less than 140 mg/dL      Peak postprandial:   less than 180 mg/dL (1-2 hours)      Critically ill patients:  140 - 180 mg/dL   Reason for Assessment: Hyperglycemia  Diabetes history: DM2 Outpatient Diabetes medications: 70/30 40 units bid, Regular s/s, metformin 1000 mg bid Current orders for Inpatient glycemic control: NPH 45 units bid, Novolog moderate tidwc, metformin 1000 mg bid  Inpatient Diabetes Program Recommendations Correction (SSI): Increase Novolog to resistant tidwc and hs Insulin - Meal Coverage: Add Novolog 6 units tidwc for meal coverage insulin if pt eats >50% meal HgbA1C: 13.0% - uncontrolled Outpatient Referral: OP Diabetes Education consult for uncontrolled DM Diet: CHO mod med - RN please encourage pt to make healthy choices using portion control in the cafeteria.  Note: According to med rec, pt is on 70/30 40 units bid. Will call pharmacy.  Continue to follow. Thank you. Ailene Ardshonda Teria Khachatryan, RD, LDN, CDE Inpatient Diabetes Coordinator [phone]

## 2014-01-26 NOTE — BHH Counselor (Addendum)
2:44 pm Pt is going to be d/c home and [facility] not be [facility]. IVC was rescinded by Dr. Ladona Ridgelaylor per CSW [place].   [facility] Julieanne Cottonina [facility] pt [facility] been [facility] to bed 402-1. Pt completed support paperwork earlier today.   Evette Cristalaroline Paige Annalisa Colonna, [place]CSWA Assessment Counselor

## 2014-01-27 ENCOUNTER — Observation Stay (HOSPITAL_COMMUNITY)
Admission: EM | Admit: 2014-01-27 | Discharge: 2014-01-29 | Disposition: A | Discharge status: Home / Self Care | Payer: Medicaid Other | Attending: Internal Medicine | Admitting: Internal Medicine

## 2014-01-27 ENCOUNTER — Encounter (HOSPITAL_COMMUNITY): Payer: Self-pay | Admitting: Emergency Medicine

## 2014-01-27 DIAGNOSIS — F329 Major depressive disorder, single episode, unspecified: Secondary | ICD-10-CM | POA: Diagnosis present

## 2014-01-27 DIAGNOSIS — I252 Old myocardial infarction: Secondary | ICD-10-CM | POA: Insufficient documentation

## 2014-01-27 DIAGNOSIS — Z794 Long term (current) use of insulin: Secondary | ICD-10-CM | POA: Insufficient documentation

## 2014-01-27 DIAGNOSIS — I2510 Atherosclerotic heart disease of native coronary artery without angina pectoris: Secondary | ICD-10-CM | POA: Diagnosis present

## 2014-01-27 DIAGNOSIS — E1165 Type 2 diabetes mellitus with hyperglycemia: Principal | ICD-10-CM

## 2014-01-27 DIAGNOSIS — IMO0001 Reserved for inherently not codable concepts without codable children: Principal | ICD-10-CM | POA: Insufficient documentation

## 2014-01-27 DIAGNOSIS — Z79899 Other long term (current) drug therapy: Secondary | ICD-10-CM

## 2014-01-27 DIAGNOSIS — Z9119 Patient's noncompliance with other medical treatment and regimen: Secondary | ICD-10-CM | POA: Insufficient documentation

## 2014-01-27 DIAGNOSIS — Z87891 Personal history of nicotine dependence: Secondary | ICD-10-CM | POA: Insufficient documentation

## 2014-01-27 DIAGNOSIS — G8929 Other chronic pain: Secondary | ICD-10-CM | POA: Diagnosis present

## 2014-01-27 DIAGNOSIS — F3189 Other bipolar disorder: Secondary | ICD-10-CM | POA: Insufficient documentation

## 2014-01-27 DIAGNOSIS — I10 Essential (primary) hypertension: Secondary | ICD-10-CM | POA: Diagnosis present

## 2014-01-27 DIAGNOSIS — R739 Hyperglycemia, unspecified: Secondary | ICD-10-CM | POA: Diagnosis present

## 2014-01-27 DIAGNOSIS — R7309 Other abnormal glucose: Secondary | ICD-10-CM

## 2014-01-27 DIAGNOSIS — F209 Schizophrenia, unspecified: Secondary | ICD-10-CM | POA: Diagnosis present

## 2014-01-27 DIAGNOSIS — M109 Gout, unspecified: Secondary | ICD-10-CM | POA: Insufficient documentation

## 2014-01-27 DIAGNOSIS — E119 Type 2 diabetes mellitus without complications: Secondary | ICD-10-CM

## 2014-01-27 DIAGNOSIS — Z91199 Patient's noncompliance with other medical treatment and regimen due to unspecified reason: Secondary | ICD-10-CM

## 2014-01-27 DIAGNOSIS — E1100 Type 2 diabetes mellitus with hyperosmolarity without nonketotic hyperglycemic-hyperosmolar coma (NKHHC): Secondary | ICD-10-CM | POA: Diagnosis present

## 2014-01-27 LAB — URINE MICROSCOPIC-ADD ON

## 2014-01-27 LAB — CBC WITH DIFFERENTIAL/PLATELET
BASOS PCT: 0 % (ref 0–1)
Basophils Absolute: 0 10*3/uL (ref 0.0–0.1)
Eosinophils Absolute: 0.1 10*3/uL (ref 0.0–0.7)
Eosinophils Relative: 1 % (ref 0–5)
HCT: 36.9 % — ABNORMAL LOW (ref 39.0–52.0)
Hemoglobin: 12.3 g/dL — ABNORMAL LOW (ref 13.0–17.0)
LYMPHS ABS: 2.2 10*3/uL (ref 0.7–4.0)
Lymphocytes Relative: 32 % (ref 12–46)
MCH: 29.4 pg (ref 26.0–34.0)
MCHC: 33.3 g/dL (ref 30.0–36.0)
MCV: 88.1 fL (ref 78.0–100.0)
Monocytes Absolute: 0.4 10*3/uL (ref 0.1–1.0)
Monocytes Relative: 5 % (ref 3–12)
NEUTROS ABS: 4.1 10*3/uL (ref 1.7–7.7)
NEUTROS PCT: 61 % (ref 43–77)
Platelets: 309 10*3/uL (ref 150–400)
RBC: 4.19 MIL/uL — AB (ref 4.22–5.81)
RDW: 13.4 % (ref 11.5–15.5)
WBC: 6.7 10*3/uL (ref 4.0–10.5)

## 2014-01-27 LAB — RAPID URINE DRUG SCREEN, HOSP PERFORMED
Amphetamines: NOT DETECTED
Barbiturates: NOT DETECTED
Benzodiazepines: NOT DETECTED
Cocaine: NOT DETECTED
Opiates: NOT DETECTED
Tetrahydrocannabinol: NOT DETECTED

## 2014-01-27 LAB — URINALYSIS, ROUTINE W REFLEX MICROSCOPIC
BILIRUBIN URINE: NEGATIVE
Glucose, UA: >1000 mg/dL — AB
Ketones, ur: NEGATIVE mg/dL
Leukocytes, UA: NEGATIVE
Nitrite: NEGATIVE
Protein, ur: 100 mg/dL — AB
SPECIFIC GRAVITY, URINE: 1.015 (ref 1.005–1.030)
Urobilinogen, UA: 0.2 mg/dL (ref 0.0–1.0)
pH: 6 (ref 5.0–8.0)

## 2014-01-27 LAB — GLUCOSE, CAPILLARY: Glucose-Capillary: 256 mg/dL — ABNORMAL HIGH (ref 70–99)

## 2014-01-27 LAB — BASIC METABOLIC PANEL
BUN: 26 mg/dL — ABNORMAL HIGH (ref 6–23)
CO2: 25 mEq/L (ref 19–32)
Calcium: 9.3 mg/dL (ref 8.4–10.5)
Chloride: 96 mEq/L (ref 96–112)
Creatinine, Ser: 1.1 mg/dL (ref 0.50–1.35)
GFR calc Af Amer: 84 mL/min — ABNORMAL LOW (ref >90)
GFR calc non Af Amer: 72 mL/min — ABNORMAL LOW (ref >90)
Glucose, Bld: 588 mg/dL (ref 70–99)
Potassium: 4 mEq/L (ref 3.7–5.3)
Sodium: 134 mEq/L — ABNORMAL LOW (ref 137–147)

## 2014-01-27 LAB — ETHANOL: Alcohol, Ethyl (B): <11 mg/dL (ref 0–11)

## 2014-01-27 LAB — CBG MONITORING, ED
GLUCOSE-CAPILLARY: 271 mg/dL — AB (ref 70–99)
Glucose-Capillary: 409 mg/dL — ABNORMAL HIGH (ref 70–99)
Glucose-Capillary: 395 mg/dL — ABNORMAL HIGH (ref 70–99)
Glucose-Capillary: 346 mg/dL — ABNORMAL HIGH (ref 70–99)

## 2014-01-27 MED ORDER — ACETAMINOPHEN 650 MG RE SUPP: 650.0000 mg | Freq: Four times a day (QID) | RECTAL | Status: DC | PRN

## 2014-01-27 MED ORDER — ONDANSETRON HCL 4 MG/2ML IJ SOLN: 4.0000 mg | Freq: Four times a day (QID) | INTRAMUSCULAR | Status: DC | PRN

## 2014-01-27 MED ORDER — TRAZODONE HCL 50 MG PO TABS
25.0000 mg | ORAL_TABLET | Freq: Every day | ORAL | Status: DC
  Administered 2014-01-27 – 2014-01-28 (×2): 25 mg via ORAL
  Filled 2014-01-27 (×2): qty 1

## 2014-01-27 MED ORDER — ALUM & MAG HYDROXIDE-SIMETH 200-200-20 MG/5ML PO SUSP: 30.0000 mL | Freq: Four times a day (QID) | ORAL | Status: DC | PRN

## 2014-01-27 MED ORDER — METFORMIN HCL 500 MG PO TABS
1000.0000 mg | ORAL_TABLET | Freq: Once | ORAL | Status: AC
  Administered 2014-01-27: 1000 mg via ORAL
  Filled 2014-01-27: qty 2

## 2014-01-27 MED ORDER — SODIUM CHLORIDE 0.9 % IV SOLN
INTRAVENOUS | Status: DC
Start: 2014-01-27 — End: 2014-01-28
  Administered 2014-01-27: 1000 mL via INTRAVENOUS

## 2014-01-27 MED ORDER — INSULIN ASPART 100 UNIT/ML ~~LOC~~ SOLN
0.0000 [IU] | Freq: Every day | SUBCUTANEOUS | Status: DC
  Administered 2014-01-27: 3 [IU] via SUBCUTANEOUS

## 2014-01-27 MED ORDER — INSULIN ASPART 100 UNIT/ML ~~LOC~~ SOLN
0.0000 [IU] | Freq: Three times a day (TID) | SUBCUTANEOUS | Status: DC
  Administered 2014-01-28: 8 [IU] via SUBCUTANEOUS
  Administered 2014-01-28: 15 [IU] via SUBCUTANEOUS
  Administered 2014-01-29: 5 [IU] via SUBCUTANEOUS
  Administered 2014-01-29: 8 [IU] via SUBCUTANEOUS
  Filled 2014-01-27: qty 1

## 2014-01-27 MED ORDER — INSULIN ASPART 100 UNIT/ML ~~LOC~~ SOLN
6.0000 [IU] | Freq: Three times a day (TID) | SUBCUTANEOUS | Status: DC
  Administered 2014-01-28 (×2): 6 [IU] via SUBCUTANEOUS

## 2014-01-27 MED ORDER — METOPROLOL TARTRATE 25 MG PO TABS
25.0000 mg | ORAL_TABLET | Freq: Two times a day (BID) | ORAL | Status: DC
  Administered 2014-01-27 – 2014-01-29 (×4): 25 mg via ORAL
  Filled 2014-01-27 (×4): qty 1

## 2014-01-27 MED ORDER — SERTRALINE HCL 50 MG PO TABS
50.0000 mg | ORAL_TABLET | Freq: Every day | ORAL | Status: DC
  Administered 2014-01-28 – 2014-01-29 (×2): 50 mg via ORAL
  Filled 2014-01-27 (×3): qty 1

## 2014-01-27 MED ORDER — ENALAPRIL MALEATE 5 MG PO TABS
20.0000 mg | ORAL_TABLET | Freq: Every day | ORAL | Status: DC
  Administered 2014-01-27 – 2014-01-29 (×3): 20 mg via ORAL
  Filled 2014-01-27 (×3): qty 4

## 2014-01-27 MED ORDER — ONDANSETRON HCL 4 MG PO TABS: 4.0000 mg | ORAL_TABLET | Freq: Four times a day (QID) | ORAL | Status: DC | PRN

## 2014-01-27 MED ORDER — INSULIN ASPART PROT & ASPART (70-30 MIX) 100 UNIT/ML ~~LOC~~ SUSP
SUBCUTANEOUS | Status: AC
  Filled 2014-01-27: qty 10

## 2014-01-27 MED ORDER — COLCHICINE 0.6 MG PO TABS
0.6000 mg | ORAL_TABLET | Freq: Two times a day (BID) | ORAL | Status: DC
  Administered 2014-01-27 – 2014-01-29 (×4): 0.6 mg via ORAL
  Filled 2014-01-27 (×4): qty 1

## 2014-01-27 MED ORDER — HYDROCODONE-ACETAMINOPHEN 5-325 MG PO TABS
1.0000 | ORAL_TABLET | ORAL | Status: DC | PRN
  Administered 2014-01-27 – 2014-01-28 (×2): 1 via ORAL
  Administered 2014-01-29: 2 via ORAL
  Filled 2014-01-27: qty 1
  Filled 2014-01-27: qty 2
  Filled 2014-01-27: qty 1

## 2014-01-27 MED ORDER — ENOXAPARIN SODIUM 40 MG/0.4ML ~~LOC~~ SOLN
40.0000 mg | SUBCUTANEOUS | Status: DC
  Administered 2014-01-27 – 2014-01-28 (×2): 40 mg via SUBCUTANEOUS
  Filled 2014-01-27 (×2): qty 0.4

## 2014-01-27 MED ORDER — INSULIN ASPART PROT & ASPART (70-30 MIX) 100 UNIT/ML ~~LOC~~ SUSP
50.0000 [IU] | Freq: Two times a day (BID) | SUBCUTANEOUS | Status: DC
  Administered 2014-01-27 – 2014-01-29 (×4): 50 [IU] via SUBCUTANEOUS
  Filled 2014-01-27: qty 10

## 2014-01-27 MED ORDER — SODIUM CHLORIDE 0.9 % IV BOLUS (SEPSIS)
1000.0000 mL | Freq: Once | INTRAVENOUS | Status: AC
  Administered 2014-01-27: 1000 mL via INTRAVENOUS

## 2014-01-27 MED ORDER — ALLOPURINOL 100 MG PO TABS
100.0000 mg | ORAL_TABLET | Freq: Every day | ORAL | Status: DC
  Administered 2014-01-28 – 2014-01-29 (×2): 100 mg via ORAL
  Filled 2014-01-27 (×3): qty 1

## 2014-01-27 MED ORDER — ACETAMINOPHEN 325 MG PO TABS
650.0000 mg | ORAL_TABLET | Freq: Four times a day (QID) | ORAL | Status: DC | PRN
Start: 2014-01-27 — End: 2014-01-29

## 2014-01-27 MED ORDER — SODIUM CHLORIDE 0.9 % IV SOLN
INTRAVENOUS | Status: DC
  Administered 2014-01-27: 21:00:00 via INTRAVENOUS

## 2014-01-27 MED ORDER — INSULIN ASPART 100 UNIT/ML ~~LOC~~ SOLN
10.0000 [IU] | Freq: Once | SUBCUTANEOUS | Status: AC
  Administered 2014-01-27: 10 [IU] via SUBCUTANEOUS
  Filled 2014-01-27: qty 1

## 2014-01-27 NOTE — ED Notes (Signed)
CRITICAL VALUE ALERT  Critical value received: Blood sugar 588  Date of notification:  01/27/14  Time of notification:  1335  Critical value read back:yes  Nurse who received alert:  Tarri Glennrystal Orlin Kann RN  MD notified (1st page):  Dr Freida BusmanAllen  Time of first page:  1336  MD notified (2nd page):  Time of second page:  Responding MD:  Dr Freida BusmanAllen   Time MD responded:  1336
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
## 2014-01-27 NOTE — H&P (Signed)
[facility] History and Physical  [name] MVH:[id number] DOB: [birth year] DOA: 01/27/2014  Referring physician:  PCP: No PCP Per Patient   Chief Complaint: hyperglycemia  HPI: [name] is a 58 y.o. male with a past medical history of hypertension hyperlipidemia insulin-dependent diabetes with noncompliance schizophrenia recently discharged from behavioral health presents to the emergency department complaining of auditory hallucinations that began yesterday. He reports his symptoms are usually well-controlled by medications but states last night he began hearing voices. He called behavioral health and was advised to come to the emergency department. During that evaluation he was found to have serum glucose of 588. He denies suicidal ideation or attempted suicide. He does take Abilify and reports taken an extra dose because of the worsening symptoms yesterday. He will need to be readmitted to behavioral health once his hyperglycemia is resolved. In the emergency department he is given 2 L of normal saline, thousand milligrams of metformin and 10 units of insulin subcutaneously. He is hemodynamically stable and not hypoxic.    Review of Systems:  10 point review of systems completed and all systems are negative except as indicated by history of present illness   Past Medical History  Diagnosis Date  . Diabetes mellitus   . Hypertension   . Gout   . MI (myocardial infarction)   . Chronic pain     right elbow  . Major depression, chronic   . Bipolar disorder   . Schizophrenia    Past Surgical History  Procedure Laterality Date  . Coronary angioplasty with stent placement      2008  . Knee surgery      both   Social History:  reports that he quit smoking about 12 months ago. His smoking use included Cigarettes. He smoked 0.00 packs per day for .1 years. He has never used smokeless tobacco. He reports that he does not drink alcohol or use illicit drugs. He most recently  was living with his mother for the last 2 days prior to that was recently discharged from behavioral health facility after an involuntary commitment cancelled.  Allergies  Allergen Reactions  . Fish Allergy     All seafood makes his throat swell  . Tomato     Cannot breathe  . Lactose Intolerance (Gi)     Family History  Problem Relation Age of Onset  . Diabetic kidney disease Mother   . Hypertension Mother   . Gout Mother   . Diabetic kidney disease Father   . Heart attack Brother 37     Prior to Admission medications   Medication Sig Start Date End Date Taking? Authorizing Provider  allopurinol (ZYLOPRIM) 100 MG tablet Take 1 tablet (100 mg total) by mouth daily. 12/27/13  Yes Fransisca Kaufmann, NP  ARIPiprazole (ABILIFY) 15 MG tablet Take 0.5 tablets (7.5 mg total) by mouth daily at 10 pm. 12/27/13  Yes Fransisca Kaufmann, NP  colchicine 0.6 MG tablet Take 1 tablet (0.6 mg total) by mouth 2 (two) times daily. 12/27/13  Yes Fransisca Kaufmann, NP  enalapril (VASOTEC) 20 MG tablet Take 1 tablet (20 mg total) by mouth daily. 12/27/13  Yes Fransisca Kaufmann, NP  hydrochlorothiazide (MICROZIDE) 12.5 MG capsule Take 1 capsule (12.5 mg total) by mouth daily. 12/27/13  Yes Fransisca Kaufmann, NP  insulin NPH-regular Human (NOVOLIN 70/30) (70-30) 100 UNIT/ML injection Inject 40 Units into the skin 2 (two) times daily with a meal. 12/27/13  Yes Fransisca Kaufmann, NP  insulin regular (NOVOLIN R,HUMULIN R)  100 units/mL injection Inject 0.02-0.15 mLs (2-15 Units total) into the skin 3 (three) times daily before meals. Sliding scale insulin 12/27/13  Yes Fransisca Kaufmann, NP  metFORMIN (GLUCOPHAGE) 1000 MG tablet Take 1 tablet (1,000 mg total) by mouth 2 (two) times daily with a meal. 12/27/13  Yes Fransisca Kaufmann, NP  sertraline (ZOLOFT) 50 MG tablet Take 50 mg by mouth daily.   Yes Historical Provider, MD  traZODone (DESYREL) 25 mg TABS tablet Take 0.5 tablets (25 mg total) by mouth at bedtime. 12/27/13  Yes Fransisca Kaufmann, NP   Physical Exam: Filed Vitals:    01/27/14 1228  BP: 172/81  Pulse: 93  Temp: 97.9 F (36.6 C)  Resp: 18    BP 172/81  Pulse 93  Temp(Src) 97.9 F (36.6 C) (Oral)  Resp 18  Ht 5\' 6"  (1.676 m)  Wt 82.101 kg (181 lb)  BMI 29.23 kg/m2  SpO2 98%  General:  Appears calm and comfortable Eyes: PERRL, normal lids, irises & conjunctiva ENT: grossly normal hearing, lips & tongue mucous membranes slightly dry Neck: no LAD, masses or thyromegaly Cardiovascular: RRR, no m/r/g. No LE edema. Telemetry: SR, no arrhythmias  Respiratory: CTA bilaterally, no w/r/r. Normal respiratory effort. Abdomen: soft, ntnd Skin: no rash or induration seen on limited exam Musculoskeletal: grossly normal tone BUE/BLE Psychiatric: grossly normal mood and affect, speech fluent and appropriate Neurologic: grossly non-focal.          Labs on Admission:  Basic Metabolic Panel:  Recent Labs Lab 01/24/14 0659 01/27/14 1309  NA 134* 134*  K 4.4 4.0  CL 98 96  CO2 22 25  GLUCOSE 583* 588*  BUN 20 26*  CREATININE 1.34 1.10  CALCIUM 8.9 9.3   Liver Function Tests:  Recent Labs Lab 01/24/14 0659  AST 21  ALT 29  ALKPHOS 67  BILITOT 0.3  PROT 7.0  ALBUMIN 3.4*   No results found for this basename: LIPASE, AMYLASE,  in the last 168 hours No results found for this basename: AMMONIA,  in the last 168 hours CBC:  Recent Labs Lab 01/24/14 0659 01/27/14 1309  WBC 7.0 6.7  NEUTROABS  --  4.1  HGB 12.5* 12.3*  HCT 36.2* 36.9*  MCV 86.4 88.1  PLT 260 309   Cardiac Enzymes: No results found for this basename: CKTOTAL, CKMB, CKMBINDEX, TROPONINI,  in the last 168 hours  BNP (last 3 results) No results found for this basename: PROBNP,  in the last 8760 hours CBG:  Recent Labs Lab 01/26/14 0131 01/26/14 0558 01/26/14 0813 01/26/14 1309 01/27/14 1423  GLUCAP 323* 302* 262* 244* 409*    Radiological Exams on Admission: No results found.  EKG:   Assessment/Plan Principal Problem:   Hyperglycemia. Likely related  to noncompliance with diet medications secondary to psychotic episode. Most recent CBG was 388 in the emergency department. He has received 2 L of normal saline as well as 10 units subcutaneous insulin. I will continue IV hydration and use sliding scale insulin with meal coverage. His home regimen includes NPH 70 3040 units twice a day. Hold metformin for now. He is also on Abilify will hold this for now. Will increase this to 50 units. Recent A1c 10.0 indicating uncontrolled diabetes. Carb modified diet  Active Problems: Schizophrenia: History of same. Patient recently discharged from behavioral health facility after involuntary commitment canceled. He reports that the voices came back last night. At the time of my exam he still hears voices but "not as strong as before".  Will continue his home medications. Will obtain a psych consult. Plan is to transfer to inpatient facility once #1 is resolved. Currently denies suicide or homicide ideation    Major depression, chronic: See above    Chronic pain: Appears stable at baseline    Hypertension: Only fair control. His home medications include HCTZ. Will hold that for now. Will likely resume in the a.m.    Noncompliance: Likely related to #2 #3    CAD (coronary artery disease): No chest pain.     Psychiatry Code Status: full Family Communication: none present [facility]  Time spent: 60 minutes  Lesle ChrisKaren M Lauderdale Community HospitalBlack [facility]

## 2014-01-27 NOTE — H&P (Signed)
Patient seen and examined. Above note reviewed.  Patient has been admitted with auditory hallucinations and possible homicidal ideations. He has uncontrolled diabetes. He was found to be hyperglycemic but did not have any evidence of anion gap. He is being treated with subcutaneous insulin. Once blood sugars are under control, he will be cleared for disposition per psychiatry Psychiatry consult has been requested.  Darden RestaurantsJehanzeb Knoxx Delacruz

## 2014-01-27 NOTE — ED Notes (Signed)
Patient brought in via EMS from home. Alert and oriented. Airway patent. Per patient "has always heard voices but they have gotten worse and now he has hallucinations." Patient denies any HI or SI. Patient reports hx of HI in which he was arrested and placed in jail for 2 years because of hallucinations and not seeking help. Patient reports calling behavioral health and being told that a room was available for him but he needed to come into the nearest ER.

## 2014-01-28 ENCOUNTER — Encounter (HOSPITAL_COMMUNITY): Payer: Self-pay | Admitting: General Practice

## 2014-01-28 DIAGNOSIS — F4320 Adjustment disorder, unspecified: Secondary | ICD-10-CM

## 2014-01-28 LAB — HEMOGLOBIN A1C
HEMOGLOBIN A1C: 12.2 % — AB (ref <5.7)
Mean Plasma Glucose: 303 mg/dL — ABNORMAL HIGH (ref <117)

## 2014-01-28 LAB — GLUCOSE, CAPILLARY
GLUCOSE-CAPILLARY: 288 mg/dL — AB (ref 70–99)
Glucose-Capillary: 393 mg/dL — ABNORMAL HIGH (ref 70–99)
Glucose-Capillary: 92 mg/dL (ref 70–99)
Glucose-Capillary: 165 mg/dL — ABNORMAL HIGH (ref 70–99)

## 2014-01-28 MED ORDER — INSULIN ASPART 100 UNIT/ML ~~LOC~~ SOLN: 9.0000 [IU] | Freq: Three times a day (TID) | SUBCUTANEOUS | Status: DC

## 2014-01-28 MED ORDER — METFORMIN HCL 500 MG PO TABS
1000.0000 mg | ORAL_TABLET | Freq: Two times a day (BID) | ORAL | Status: DC
  Administered 2014-01-28 – 2014-01-29 (×3): 1000 mg via ORAL
  Filled 2014-01-28 (×3): qty 2

## 2014-01-28 MED ORDER — RISPERIDONE 0.5 MG PO TABS
0.5000 mg | ORAL_TABLET | Freq: Two times a day (BID) | ORAL | Status: DC
  Administered 2014-01-28 – 2014-01-29 (×3): 0.5 mg via ORAL
  Filled 2014-01-28 (×3): qty 1

## 2014-01-28 NOTE — Progress Notes (Signed)
I spoke with [name] at behavioral health hospital assessment team at [phone] she states the patient will be first on the list when a provider is available for a psychiatry consult.   Nursing staff to continue to monitor.

## 2014-01-28 NOTE — Progress Notes (Signed)
Patient is refusing to have fluids infusing at this time. Notified MD. MD ordered to discontinue NS and allow patient to be saline locked. Patient is resting at this time. Will continue to monitor.

## 2014-01-28 NOTE — Progress Notes (Signed)
UR completed 

## 2014-01-28 NOTE — Consult Note (Signed)
Telepsych Consultation   Reason for Consult:  Aggressive Behavior Referring Physician:  EDP  [name] is an 58 y.o. male.  Assessment: AXIS I:  Adjustment Disorder NOS AXIS II:  Deferred AXIS III:   Past Medical History  Diagnosis Date  . Diabetes mellitus   . Hypertension   . Gout   . MI (myocardial infarction)   . Chronic pain     right elbow  . Major depression, chronic   . Bipolar disorder   . Schizophrenia    AXIS IV:  other psychosocial or environmental problems AXIS V:  61-70 mild symptoms  Plan:  Recommend psychiatric Inpatient admission when medically cleared.  Subjective:   [name] is a 58 y.o. male patient.  HPI:  Patient states that he was back in the hospital related to his blood sugars.  States that the doctor stopped his Abilify because felt it was keeping his blood sugars high.  Patient states that the voices are at base line "hearing voices often but able to tune them out.  No medicine has ever taken them completely away."  Patient states that voices are not command at this time only telling him that he is "sorry or worthless. But that comes from the things that my daddy use to tell me.  I am fine I just want to get on a medication before leaving.  I can call [facility] on Monday and set up my appointment; they said I couldn't walk in that I needed to set up an appointment."  Patient is staying at his sisters house and states that she said he could stay there until he was back on his feel and a place to go.  Patient denies suicidal/homicidal ideation and paranoia.  Patient states that he doesn't need to come into the hospital for the voices state that he is handling it.   Patient states that drawing also helps him to ignore the voices.  Patient states that he has taken Risperidone before in past and that it worked well.  Informed patient that it would be restarted and that once he has his appointment at [facility] if any adjustments needed to be made it could  be done there.  Patient in agreement.    HPI Elements:   Location:  Hallucinations. Quality:  verbal hallucinations. Severity:  verbal hallucinations. Timing:  1 week. Review of Systems  Musculoskeletal: Negative.   Psychiatric/Behavioral: Positive for hallucinations and substance abuse (Cocaine). Negative for depression and suicidal ideas. The patient is not nervous/anxious.     Past Psychiatric History: Past Medical History  Diagnosis Date  . Diabetes mellitus   . Hypertension   . Gout   . MI (myocardial infarction)   . Chronic pain     right elbow  . Major depression, chronic   . Bipolar disorder   . Schizophrenia     reports that he quit smoking about 12 months ago. His smoking use included Cigarettes. He smoked 0.00 packs per day for .1 years. He has never used smokeless tobacco. He reports that he does not drink alcohol or use illicit drugs. Family History  Problem Relation Age of Onset  . Diabetic kidney disease Mother   . Hypertension Mother   . Gout Mother   . Diabetic kidney disease Father   . Heart attack Brother 10     Living Arrangements: Alone   Allergies:   Allergies  Allergen Reactions  . Fish Allergy     All seafood makes his throat  swell  . Tomato     Cannot breathe  . Lactose Intolerance (Gi)     ACT Assessment Complete:  Yes:    Educational Status    Risk to Self: Risk to self Is patient at risk for suicide?: No Substance abuse history and/or treatment for substance abuse?: No  Risk to Others:    Abuse: Abuse/Neglect Assessment (Assessment to be complete while patient is alone) Physical Abuse: Denies Verbal Abuse: Denies Sexual Abuse: Denies Exploitation of patient/patient's resources: Denies Self-Neglect: Denies  Prior Inpatient Therapy:    Prior Outpatient Therapy:    Additional Information:     Objective: Blood pressure 155/79, pulse 83, temperature 98.2 F (36.8 C), temperature source Oral, resp. rate 18, height '5\' 6"'  (1.676 m),  weight 80.559 kg (177 lb 9.6 oz), SpO2 98.00%.Body mass index is 28.68 kg/(m^2). Results for orders placed during the hospital encounter of 01/27/14 (from the past 72 hour(s))  URINALYSIS, ROUTINE W REFLEX MICROSCOPIC     Status: Abnormal   Collection Time    01/27/14  1:00 PM      Result Value Ref Range   Color, Urine STRAW (*) YELLOW   APPearance CLEAR  CLEAR   Specific Gravity, Urine 1.015  1.005 - 1.030   pH 6.0  5.0 - 8.0   Glucose, UA >1000 (*) NEGATIVE mg/dL   Hgb urine dipstick SMALL (*) NEGATIVE   Bilirubin Urine NEGATIVE  NEGATIVE   Ketones, ur NEGATIVE  NEGATIVE mg/dL   Protein, ur 100 (*) NEGATIVE mg/dL   Urobilinogen, UA 0.2  0.0 - 1.0 mg/dL   Nitrite NEGATIVE  NEGATIVE   Leukocytes, UA NEGATIVE  NEGATIVE  URINE RAPID DRUG SCREEN (HOSP PERFORMED)     Status: None   Collection Time    01/27/14  1:00 PM      Result Value Ref Range   Opiates NONE DETECTED  NONE DETECTED   Cocaine NONE DETECTED  NONE DETECTED   Benzodiazepines NONE DETECTED  NONE DETECTED   Amphetamines NONE DETECTED  NONE DETECTED   Tetrahydrocannabinol NONE DETECTED  NONE DETECTED   Barbiturates NONE DETECTED  NONE DETECTED   Comment:            DRUG SCREEN FOR MEDICAL PURPOSES     ONLY.  IF CONFIRMATION IS NEEDED     FOR ANY PURPOSE, NOTIFY LAB     WITHIN 5 DAYS.                LOWEST DETECTABLE LIMITS     FOR URINE DRUG SCREEN     Drug Class       Cutoff (ng/mL)     Amphetamine      1000     Barbiturate      200     Benzodiazepine   774     Tricyclics       128     Opiates          300     Cocaine          300     THC              50  URINE MICROSCOPIC-ADD ON     Status: None   Collection Time    01/27/14  1:00 PM      Result Value Ref Range   Squamous Epithelial / LPF RARE  RARE   WBC, UA 0-2  <3 WBC/hpf   RBC / HPF 3-6  <3 RBC/hpf   Bacteria,  UA RARE  RARE  CBC WITH DIFFERENTIAL     Status: Abnormal   Collection Time    01/27/14  1:09 PM      Result Value Ref Range   WBC 6.7  4.0  - 10.5 K/uL   RBC 4.19 (*) 4.22 - 5.81 MIL/uL   Hemoglobin 12.3 (*) 13.0 - 17.0 g/dL   HCT 36.9 (*) 39.0 - 52.0 %   MCV 88.1  78.0 - 100.0 fL   MCH 29.4  26.0 - 34.0 pg   MCHC 33.3  30.0 - 36.0 g/dL   RDW 13.4  11.5 - 15.5 %   Platelets 309  150 - 400 K/uL   Neutrophils Relative % 61  43 - 77 %   Neutro Abs 4.1  1.7 - 7.7 K/uL   Lymphocytes Relative 32  12 - 46 %   Lymphs Abs 2.2  0.7 - 4.0 K/uL   Monocytes Relative 5  3 - 12 %   Monocytes Absolute 0.4  0.1 - 1.0 K/uL   Eosinophils Relative 1  0 - 5 %   Eosinophils Absolute 0.1  0.0 - 0.7 K/uL   Basophils Relative 0  0 - 1 %   Basophils Absolute 0.0  0.0 - 0.1 K/uL  BASIC METABOLIC PANEL     Status: Abnormal   Collection Time    01/27/14  1:09 PM      Result Value Ref Range   Sodium 134 (*) 137 - 147 mEq/L   Potassium 4.0  3.7 - 5.3 mEq/L   Chloride 96  96 - 112 mEq/L   CO2 25  19 - 32 mEq/L   Glucose, Bld 588 (*) 70 - 99 mg/dL   Comment: CRITICAL RESULT CALLED TO, READ BACK BY AND VERIFIED WITH:     BLACKBURN,C AT 1:40PM ON 01/27/14 BY FESTERMAN,C   BUN 26 (*) 6 - 23 mg/dL   Creatinine, Ser 1.10  0.50 - 1.35 mg/dL   Calcium 9.3  8.4 - 10.5 mg/dL   GFR calc non Af Amer 72 (*) >90 mL/min   GFR calc Af Amer 84 (*) >90 mL/min   Comment: (NOTE)     The eGFR has been calculated using the CKD EPI equation.     This calculation has not been validated in all clinical situations.     eGFR's persistently <90 mL/min signify possible Chronic Kidney     Disease.  ETHANOL     Status: None   Collection Time    01/27/14  1:09 PM      Result Value Ref Range   Alcohol, Ethyl (B) <11  0 - 11 mg/dL   Comment:            LOWEST DETECTABLE LIMIT FOR     SERUM ALCOHOL IS 11 mg/dL     FOR MEDICAL PURPOSES ONLY  CBG MONITORING, ED     Status: Abnormal   Collection Time    01/27/14  2:23 PM      Result Value Ref Range   Glucose-Capillary 409 (*) 70 - 99 mg/dL  CBG MONITORING, ED     Status: Abnormal   Collection Time    01/27/14  3:27  PM      Result Value Ref Range   Glucose-Capillary 395 (*) 70 - 99 mg/dL  CBG MONITORING, ED     Status: Abnormal   Collection Time    01/27/14  4:34 PM      Result Value Ref Range  Glucose-Capillary 346 (*) 70 - 99 mg/dL  CBG MONITORING, ED     Status: Abnormal   Collection Time    01/27/14  6:40 PM      Result Value Ref Range   Glucose-Capillary 271 (*) 70 - 99 mg/dL  GLUCOSE, CAPILLARY     Status: Abnormal   Collection Time    01/27/14  9:46 PM      Result Value Ref Range   Glucose-Capillary 256 (*) 70 - 99 mg/dL  GLUCOSE, CAPILLARY     Status: Abnormal   Collection Time    01/28/14  7:53 AM      Result Value Ref Range   Glucose-Capillary 288 (*) 70 - 99 mg/dL   Comment 1 Notify RN    GLUCOSE, CAPILLARY     Status: Abnormal   Collection Time    01/28/14 11:23 AM      Result Value Ref Range   Glucose-Capillary 393 (*) 70 - 99 mg/dL   Labs are reviewed and are pertinent for UDS positive for cocaine.  Home medications reviewed.  Abilifiy discontinued during hospital admission.  Patient will be started on Risperidone 0.5 mg Bid.  Current Facility-Administered Medications  Medication Dose Route Frequency Provider Last Rate Last Dose  . acetaminophen (TYLENOL) tablet 650 mg  650 mg Oral Q6H PRN Radene Gunning, NP       Or  . acetaminophen (TYLENOL) suppository 650 mg  650 mg Rectal Q6H PRN Radene Gunning, NP      . allopurinol (ZYLOPRIM) tablet 100 mg  100 mg Oral Daily Radene Gunning, NP   100 mg at 01/28/14 2130  . alum & mag hydroxide-simeth (MAALOX/MYLANTA) 200-200-20 MG/5ML suspension 30 mL  30 mL Oral Q6H PRN Lezlie Octave Black, NP      . colchicine tablet 0.6 mg  0.6 mg Oral BID Radene Gunning, NP   0.6 mg at 01/28/14 8657  . enalapril (VASOTEC) tablet 20 mg  20 mg Oral Daily Radene Gunning, NP   20 mg at 01/28/14 8469  . enoxaparin (LOVENOX) injection 40 mg  40 mg Subcutaneous Q24H Radene Gunning, NP   40 mg at 01/27/14 2103  . HYDROcodone-acetaminophen (NORCO/VICODIN) 5-325 MG  per tablet 1-2 tablet  1-2 tablet Oral Q4H PRN Radene Gunning, NP   1 tablet at 01/28/14 0301  . insulin aspart (novoLOG) injection 0-15 Units  0-15 Units Subcutaneous TID WC Radene Gunning, NP   15 Units at 01/28/14 1325  . insulin aspart (novoLOG) injection 0-5 Units  0-5 Units Subcutaneous QHS Radene Gunning, NP   3 Units at 01/27/14 2306  . insulin aspart (novoLOG) injection 6 Units  6 Units Subcutaneous TID WC Radene Gunning, NP   6 Units at 01/28/14 1326  . insulin aspart protamine- aspart (NOVOLOG MIX 70/30) injection 50 Units  50 Units Subcutaneous BID WC Radene Gunning, NP   50 Units at 01/28/14 0920  . metFORMIN (GLUCOPHAGE) tablet 1,000 mg  1,000 mg Oral BID WC Radene Gunning, NP   1,000 mg at 01/28/14 1324  . metoprolol tartrate (LOPRESSOR) tablet 25 mg  25 mg Oral BID Kathie Dike, MD   25 mg at 01/28/14 0921  . ondansetron (ZOFRAN) tablet 4 mg  4 mg Oral Q6H PRN Radene Gunning, NP       Or  . ondansetron [facility]) injection 4 mg  4 mg Intravenous Q6H PRN Radene Gunning, NP      .  sertraline (ZOLOFT) tablet 50 mg  50 mg Oral Daily Radene Gunning, NP   50 mg at 01/28/14 6761  . traZODone (DESYREL) tablet 25 mg  25 mg Oral QHS Radene Gunning, NP   25 mg at 01/27/14 2103    Psychiatric Specialty Exam:     Blood pressure 155/79, pulse 83, temperature 98.2 F (36.8 C), temperature source Oral, resp. rate 18, height '5\' 6"'  (1.676 m), weight 80.559 kg (177 lb 9.6 oz), SpO2 98.00%.Body mass index is 28.68 kg/(m^2).  General Appearance: Casual  Eye Contact::  Good  Speech:  Clear and Coherent and Normal Rate  Volume:  Normal  Mood:  Worthless  Affect:  Appropriate  Thought Process:  Circumstantial, Coherent and Goal Directed  Orientation:  Full (Time, Place, and Person)  Thought Content:  WDL  Suicidal Thoughts:  No  Homicidal Thoughts:  No  Memory:  Immediate;   Good Recent;   Good Remote;   Good  Judgement:  Fair  Insight:  Present  Psychomotor Activity:  Normal  Concentration:  Good   Recall:  Good  Akathisia:  No  Handed:  Right  AIMS (if indicated):     Assets:  Communication Skills Desire for Improvement  Sleep:      Treatment Plan Summary: Start Risperidoe 0.5 mg Bid and to follow up with [facility]  Consulted with Dr. Sabra Heck  Disposition:  Discharge home to follow up with [facility].  Give script for Risperidone 0.5 mg Bid 30 tablets     Shuvon Rankin, FNP-BC 01/28/2014 1:54 PM   Discussed the patient, recommended medications and participated in the assessment and disposition Zamara Cozad A. Sabra Heck, M.D.

## 2014-01-28 NOTE — Care Management Note (Unsigned)
    Page 1 of 1   01/28/2014     2:08:03 PM   CARE MANAGEMENT NOTE 01/28/2014  Patient:  Justin KeensSTACEY,Justin B   Account Number:  [id number]  Date Initiated:  01/28/2014  Documentation initiated by:  Rosemary HolmsOBSON,Akane Tessier  Subjective/Objective Assessment:   Pt lives at home alone. Pt to get a Psych eval [facility] CSW overseeing     Action/Plan:   Anticipated DC Date:  01/28/2014   Anticipated DC Plan:  [facility]  In-house referral  Clinical Social Worker      DC Planning Services  CM consult      Choice offered to / List presented to:             Status of service:  Completed, signed off Medicare Important Message given?   (If response is "NO", the following Medicare IM given date fields will be blank) Date Medicare IM given:   Date Additional Medicare IM given:    Discharge Disposition:    Per UR Regulation:    If discussed at Long Length of Stay Meetings, dates discussed:    Comments:  01/28/14 Rosemary HolmsAmy Jentzen Minasyan RN BSN CM

## 2014-01-28 NOTE — Progress Notes (Signed)
Patient requested to take a shower. I explained to the patient that if he took a shower someone would have to stay with him. Patient got very upset and stated that he did not want to take a shower with someone watching him. I explained to the patient why it was important to have someone with him while he showered. Patient refused the shower and stated that he would just leave. Notified MD of patients threat to leave. MD stated that involuntary commitment papers needed to be filled out because the patient is not well enough to leave at this time but that the first shift MD would have to fill them out. At this time patient is in room with sitter. Will continue to monitor patient.

## 2014-01-28 NOTE — Clinical Social Work Note (Addendum)
CSW received consult for inpatient treatment. Pt awaiting telepsych consult for recommendations. IVC paperwork completed this morning and on chart as pt tried to leave.   Derenda FennelKara Collin Rengel, [place]CS[place] [phone]

## 2014-01-28 NOTE — Progress Notes (Signed)
[facility] PROGRESS NOTE  Coralie KeensLarry B Dulany NFA:[id number] DOB: 07/24/1956 DOA: 01/27/2014 PCP: No PCP Per Patient  Assessment/Plan: 1. Hyperglycemia and uncontrolled diabetes. Patient has been restarted on insulin 70/30. Blood sugars have been fluctuating. We'll continue to monitor overnight. No evidence of ketoacidosis. Anion gap was normal. 2. Schizophrenia with auditory hallucinations. Patient was seen by psychiatry service and felt that he was safe to discharge with outpatient psychiatric followup. He was not felt to be a threat to himself or others. Involuntary commitment papers have been rescinded. He will followup with psychiatry as an outpatient. 3. Hypertension. Start the patient on lisinopril 10 mg daily  Code Status: full code Family Communication: discussed with patient Disposition Plan: discharge home once improved   Consultants:    Procedures:    Antibiotics:    HPI/Subjective: Feeling better today.  Denies any suicidal/homicidal ideations  Objective: Filed Vitals:   01/28/14 0423  BP: 155/79  Pulse: 83  Temp: 98.2 F (36.8 C)  Resp: 18    Intake/Output Summary (Last 24 hours) at 01/28/14 1948 Last data filed at 01/27/14 2200  Gross per 24 hour  Intake    240 ml  Output      0 ml  Net    240 ml   Filed Weights   01/27/14 1228 01/28/14 0423  Weight: 82.101 kg (181 lb) 80.559 kg (177 lb 9.6 oz)    Exam:   General:  NAD  Cardiovascular: S1, S2 RRR  Respiratory: CTA B  Abdomen: soft, nt, nd, bs+  Musculoskeletal: no edema b/l   Data Reviewed: Basic Metabolic Panel:  Recent Labs Lab 01/24/14 0659 01/27/14 1309  NA 134* 134*  K 4.4 4.0  CL 98 96  CO2 22 25  GLUCOSE 583* 588*  BUN 20 26*  CREATININE 1.34 1.10  CALCIUM 8.9 9.3   Liver Function Tests:  Recent Labs Lab 01/24/14 0659  AST 21  ALT 29  ALKPHOS 67  BILITOT 0.3  PROT 7.0  ALBUMIN 3.4*   No results found for this basename: LIPASE, AMYLASE,  in the last 168  hours No results found for this basename: AMMONIA,  in the last 168 hours CBC:  Recent Labs Lab 01/24/14 0659 01/27/14 1309  WBC 7.0 6.7  NEUTROABS  --  4.1  HGB 12.5* 12.3*  HCT 36.2* 36.9*  MCV 86.4 88.1  PLT 260 309   Cardiac Enzymes: No results found for this basename: CKTOTAL, CKMB, CKMBINDEX, TROPONINI,  in the last 168 hours BNP (last 3 results) No results found for this basename: PROBNP,  in the last 8760 hours CBG:  Recent Labs Lab 01/27/14 1840 01/27/14 2146 01/28/14 0753 01/28/14 1123 01/28/14 1702  GLUCAP 271* 256* 288* 393* 92    No results found for this or any previous visit (from the past 240 hour(s)).   Studies: No results found.  Scheduled Meds: . allopurinol  100 mg Oral Daily  . colchicine  0.6 mg Oral BID  . enalapril  20 mg Oral Daily  . enoxaparin (LOVENOX) injection  40 mg Subcutaneous Q24H  . insulin aspart  0-15 Units Subcutaneous TID WC  . insulin aspart  0-5 Units Subcutaneous QHS  . insulin aspart  9 Units Subcutaneous TID WC  . insulin aspart protamine- aspart  50 Units Subcutaneous BID WC  . metFORMIN  1,000 mg Oral BID WC  . metoprolol tartrate  25 mg Oral BID  . risperiDONE  0.5 mg Oral BID  . sertraline  50 mg Oral  Daily  . traZODone  25 mg Oral QHS   Continuous Infusions:   Principal Problem:   Diabetic hyperosmolar non-ketotic state Active Problems:   Major depression, chronic   Chronic pain   Hypertension   Noncompliance   CAD (coronary artery disease)   Schizophrenia   Hyperglycemia    Time spent:    [facility]  [facility] Pager [phone]. If 7PM-7AM, please contact night-coverage at [email or web address], password [facility] 01/28/2014, 7:48 PM  LOS: 1 day

## 2014-01-28 NOTE — Progress Notes (Signed)
Inpatient Diabetes Program Recommendations  AACE/ADA: New Consensus Statement on Inpatient Glycemic Control (2013)  Target Ranges:  Prepandial:   less than 140 mg/dL      Peak postprandial:   less than 180 mg/dL (1-2 hours)      Critically ill patients:  140 - 180 mg/dL   Results for [name], [name] (MRN [id number]) as of 01/28/2014 08:21  Ref. Range 01/26/2014 08:13 01/26/2014 13:09 01/27/2014 14:23 01/27/2014 15:27 01/27/2014 16:34 01/27/2014 18:40 01/27/2014 21:46 01/28/2014 07:53  Glucose-Capillary Latest Range: 70-99 mg/dL 782262 (H) 956244 (H) 213409 (H) 395 (H) 346 (H) 271 (H) 256 (H) 288 (H)   Diabetes history: DM2 Outpatient Diabetes medications: 70/30 40 units BID, Novolin R 2-15 units TID with meals, Metformin 1000 mg BID Current orders for Inpatient glycemic control: Novolog 70/30 50 units BID, Novolog 0-15 units AC, Novolog 0-5 units HS, Novolog 6 units TID for meal coverage  Inpatient Diabetes Program Recommendations Correction (SSI): Please consider increasing Novolog correction to resistant scale. Insulin - Meal Coverage: Please consider discontinuing Novolog 6 units TID meal coverage (not recommended to order meal coverage with 70/30 use).  Thanks, Orlando PennerMarie Serai Tukes, RN, MSN, CCRN Diabetes Coordinator Inpatient Diabetes Program [phone] (Team Pager) [phone] (AP office) [phone] [facility] office)

## 2014-01-29 LAB — GLUCOSE, CAPILLARY
GLUCOSE-CAPILLARY: 208 mg/dL — AB (ref 70–99)
GLUCOSE-CAPILLARY: 257 mg/dL — AB (ref 70–99)

## 2014-01-29 MED ORDER — METOPROLOL TARTRATE 25 MG PO TABS: 25.0000 mg | ORAL_TABLET | Freq: Two times a day (BID) | ORAL | Status: DC

## 2014-01-29 MED ORDER — RISPERIDONE 0.5 MG PO TABS: 0.5000 mg | ORAL_TABLET | Freq: Two times a day (BID) | ORAL | Status: DC

## 2014-01-29 MED ORDER — INSULIN NPH ISOPHANE & REGULAR (70-30) 100 UNIT/ML ~~LOC~~ SUSP: 50.0000 [IU] | Freq: Two times a day (BID) | SUBCUTANEOUS | Status: DC

## 2014-01-29 MED ORDER — METFORMIN HCL 1000 MG PO TABS: 1000.0000 mg | ORAL_TABLET | Freq: Two times a day (BID) | ORAL | Status: DC

## 2014-01-29 NOTE — Discharge Summary (Signed)
Physician Discharge Summary  [name]:[id number] DOB: 1956-09-10 DOA: 01/27/2014  PCP: No PCP Per Patient  Admit date: 01/27/2014 Discharge date: 01/29/2014  Time spent: 40 minutes  Recommendations for Outpatient Follow-up:  1. Primary care appointment has been scheduled for the patient. 2. He has requested followup with psychiatry the next one week.  Discharge Diagnoses:  Principal Problem:   Diabetic hyperosmolar non-ketotic state Active Problems:   Major depression, chronic   Chronic pain   Hypertension   Noncompliance   CAD (coronary artery disease)   Schizophrenia   Hyperglycemia   Discharge Condition: improved  Diet recommendation: low carb  Filed Weights   01/27/14 1228 01/28/14 0423  Weight: 82.101 kg (181 lb) 80.559 kg (177 lb 9.6 oz)    History of present illness:  [name] is a 58 y.o. male with a past medical history of hypertension hyperlipidemia insulin-dependent diabetes with noncompliance schizophrenia recently discharged from behavioral health presents to the emergency department complaining of auditory hallucinations that began yesterday. He reports his symptoms are usually well-controlled by medications but states last night he began hearing voices. He called behavioral health and was advised to come to the emergency department. During that evaluation he was found to have serum glucose of 588. He denies suicidal ideation or attempted suicide. He does take Abilify and reports taken an extra dose because of the worsening symptoms yesterday. He will need to be readmitted to behavioral health once his hyperglycemia is resolved. In the emergency department he is given 2 L of normal saline, thousand milligrams of metformin and 10 units of insulin subcutaneously. He is hemodynamically stable and not hypoxic.   Hospital Course:  This patient initially presented to the hospital with complaints of auditory hallucinations. Lab work indicated hyperglycemia with  a blood sugar greater than 500. No evidence of elevated anion gap. He was admitted for further management of his blood sugars. He was seen by psychiatry service regarding possible transfer to inpatient psychiatry due to auditory hallucinations and possible homicidal ideations. He was cleared for discharge by the psychiatry service with close outpatient followup. His Abilify was discontinued and he was started on Risperdal. Insulin 7030 was adjusted in the hospital his blood sugars are now in reasonable range. He will need further titration as an outpatient.  Procedures:    Consultations:  psychiatry  Discharge Exam: Filed Vitals:   01/29/14 0549  BP: 145/88  Pulse: 80  Temp: 97.8 F (36.6 C)  Resp: 18    General: NAD Cardiovascular: S1, S2 RRR Respiratory: CTA B  Discharge Instructions You were cared for by a hospitalist during your hospital stay. If you have any questions about your discharge medications or the care you received while you were in the hospital after you are discharged, you can call the unit and asked to speak with the hospitalist on call if the hospitalist that took care of you is not available. Once you are discharged, your primary care physician will handle any further medical issues. Please note that NO REFILLS for any discharge medications will be authorized once you are discharged, as it is imperative that you return to your primary care physician (or establish a relationship with a primary care physician if you do not have one) for your aftercare needs so that they can reassess your need for medications and monitor your lab values.  Discharge Orders   Future Appointments Provider Department Dept Phone   02/04/2014 4:00 PM Holland Commons, NP [facility] [phone]   Future Orders Complete By Expires   Call MD for:  extreme fatigue  As directed    Call MD for:  persistant dizziness or light-headedness  As directed    Diet - low sodium  heart healthy  As directed    Increase activity slowly  As directed        Medication List    STOP taking these medications       ARIPiprazole 15 MG tablet  Commonly known as:  ABILIFY      TAKE these medications       allopurinol 100 MG tablet  Commonly known as:  ZYLOPRIM  Take 1 tablet (100 mg total) by mouth daily.     colchicine 0.6 MG tablet  Take 1 tablet (0.6 mg total) by mouth 2 (two) times daily.     enalapril 20 MG tablet  Commonly known as:  VASOTEC  Take 1 tablet (20 mg total) by mouth daily.     hydrochlorothiazide 12.5 MG capsule  Commonly known as:  MICROZIDE  Take 1 capsule (12.5 mg total) by mouth daily.     insulin NPH-regular Human (70-30) 100 UNIT/ML injection  Commonly known as:  NOVOLIN 70/30  Inject 50 Units into the skin 2 (two) times daily with a meal.     insulin regular 100 units/mL injection  Commonly known as:  NOVOLIN R,HUMULIN R  Inject 0.02-0.15 mLs (2-15 Units total) into the skin 3 (three) times daily before meals. Sliding scale insulin     metFORMIN 1000 MG tablet  Commonly known as:  GLUCOPHAGE  Take 1 tablet (1,000 mg total) by mouth 2 (two) times daily with a meal.     metoprolol tartrate 25 MG tablet  Commonly known as:  LOPRESSOR  Take 1 tablet (25 mg total) by mouth 2 (two) times daily.     risperiDONE 0.5 MG tablet  Commonly known as:  RISPERDAL  Take 1 tablet (0.5 mg total) by mouth 2 (two) times daily.     sertraline 50 MG tablet  Commonly known as:  ZOLOFT  Take 50 mg by mouth daily.     traZODone 25 mg Tabs tablet  Commonly known as:  DESYREL  Take 0.5 tablets (25 mg total) by mouth at bedtime.       Allergies  Allergen Reactions  . Fish Allergy     All seafood makes his throat swell  . Tomato     Cannot breathe  . Lactose Intolerance (Gi)        Follow-up Information   Follow up with [facility]     On 02/04/2014. (@ 4:00pm)    Contact information:   [address] [place] [address] [phone]      Follow up with [facility]. (Call [facility] on Monday to schedule full-up mental health visit.)    Contact information:   [address] [place] [address] [phone]       The results of significant diagnostics from this hospitalization (including imaging, microbiology, ancillary and laboratory) are listed below for reference.    Significant Diagnostic Studies: Dg Ribs Unilateral W/chest Left  01/24/2014   CLINICAL DATA:  Pain post trauma  EXAM: LEFT RIBS AND CHEST - 3+ VIEW  COMPARISON:  Chest radiograph April 21, 2013  FINDINGS: Chest radiograph as well as bilateral oblique and cone-down lower rib images were obtained. Lungs are clear. Heart is upper normal in size with normal pulmonary  vascularity. No adenopathy. There is no effusion or pneumothorax. There is no apparent rib fracture.  IMPRESSION: No apparent rib fracture.  Lungs clear.   Electronically Signed   By: Bretta BangWilliam  Woodruff M.D.   On: 01/24/2014 07:46    Microbiology: No results found for this or any previous visit (from the past 240 hour(s)).   Labs: Basic Metabolic Panel:  Recent Labs Lab 01/24/14 0659 01/27/14 1309  NA 134* 134*  K 4.4 4.0  CL 98 96  CO2 22 25  GLUCOSE 583* 588*  BUN 20 26*  CREATININE 1.34 1.10  CALCIUM 8.9 9.3   Liver Function Tests:  Recent Labs Lab 01/24/14 0659  AST 21  ALT 29  ALKPHOS 67  BILITOT 0.3  PROT 7.0  ALBUMIN 3.4*   No results found for this basename: LIPASE, AMYLASE,  in the last 168 hours No results found for this basename: AMMONIA,  in the last 168 hours CBC:  Recent Labs Lab 01/24/14 0659 01/27/14 1309  WBC 7.0 6.7  NEUTROABS  --  4.1  HGB 12.5* 12.3*  HCT 36.2* 36.9*  MCV 86.4 88.1  PLT 260 309   Cardiac Enzymes: No results found for this basename: CKTOTAL, CKMB, CKMBINDEX, TROPONINI,  in the last 168 hours BNP: BNP (last 3 results) No results found for this basename: PROBNP,  in the  last 8760 hours CBG:  Recent Labs Lab 01/28/14 1123 01/28/14 1702 01/28/14 2213 01/29/14 0717 01/29/14 1107  GLUCAP 393* 92 165* 208* 257*       Signed:  Jehanzeb Memon  [facility] 01/29/2014, 8:57 PM

## 2014-01-29 NOTE — Discharge Instructions (Signed)

## 2014-01-29 NOTE — Progress Notes (Signed)
IV removed, site WNL.  Pt given d/c instructions and new prescriptions.  Discussed all home medications (when, how, and why to take), patient verbalizes understanding. Discussed home care with patient, teachback completed. F/U appointment in place, pt states they will keep appointment. Pt is stable at this time. Pt's medications are in the pharmacy, patient unable to wait for them to be brought down to him as he worried his sister will leave him.  Pt taken to main entrance in wheelchair by staff member.  Pt states he will come back tomorrow morning to pick up his medications.

## 2014-02-04 ENCOUNTER — Inpatient Hospital Stay: Payer: Self-pay | Admitting: Internal Medicine

## 2014-02-17 ENCOUNTER — Emergency Department (HOSPITAL_COMMUNITY): Payer: Medicaid Other

## 2014-02-17 ENCOUNTER — Emergency Department (HOSPITAL_COMMUNITY)
Admission: EM | Admit: 2014-02-17 | Discharge: 2014-02-17 | Disposition: A | Discharge status: Home / Self Care | Payer: Medicaid Other | Attending: Emergency Medicine | Admitting: Emergency Medicine

## 2014-02-17 ENCOUNTER — Encounter (HOSPITAL_COMMUNITY): Payer: Self-pay | Admitting: Emergency Medicine

## 2014-02-17 DIAGNOSIS — Z9861 Coronary angioplasty status: Secondary | ICD-10-CM | POA: Insufficient documentation

## 2014-02-17 DIAGNOSIS — I10 Essential (primary) hypertension: Secondary | ICD-10-CM | POA: Insufficient documentation

## 2014-02-17 DIAGNOSIS — S46909A Unspecified injury of unspecified muscle, fascia and tendon at shoulder and upper arm level, unspecified arm, initial encounter: Secondary | ICD-10-CM | POA: Insufficient documentation

## 2014-02-17 DIAGNOSIS — F259 Schizoaffective disorder, unspecified: Secondary | ICD-10-CM

## 2014-02-17 DIAGNOSIS — R739 Hyperglycemia, unspecified: Secondary | ICD-10-CM

## 2014-02-17 DIAGNOSIS — Z91199 Patient's noncompliance with other medical treatment and regimen due to unspecified reason: Secondary | ICD-10-CM

## 2014-02-17 DIAGNOSIS — R000 Tachycardia, unspecified: Secondary | ICD-10-CM | POA: Insufficient documentation

## 2014-02-17 DIAGNOSIS — R440 Auditory hallucinations: Secondary | ICD-10-CM | POA: Diagnosis present

## 2014-02-17 DIAGNOSIS — F329 Major depressive disorder, single episode, unspecified: Secondary | ICD-10-CM | POA: Insufficient documentation

## 2014-02-17 DIAGNOSIS — S4980XA Other specified injuries of shoulder and upper arm, unspecified arm, initial encounter: Secondary | ICD-10-CM | POA: Insufficient documentation

## 2014-02-17 DIAGNOSIS — F319 Bipolar disorder, unspecified: Secondary | ICD-10-CM | POA: Insufficient documentation

## 2014-02-17 DIAGNOSIS — G8929 Other chronic pain: Secondary | ICD-10-CM | POA: Insufficient documentation

## 2014-02-17 DIAGNOSIS — F1994 Other psychoactive substance use, unspecified with psychoactive substance-induced mood disorder: Secondary | ICD-10-CM

## 2014-02-17 DIAGNOSIS — S2249XA Multiple fractures of ribs, unspecified side, initial encounter for closed fracture: Secondary | ICD-10-CM | POA: Insufficient documentation

## 2014-02-17 DIAGNOSIS — I252 Old myocardial infarction: Secondary | ICD-10-CM | POA: Insufficient documentation

## 2014-02-17 DIAGNOSIS — F1410 Cocaine abuse, uncomplicated: Secondary | ICD-10-CM

## 2014-02-17 DIAGNOSIS — S3981XA Other specified injuries of abdomen, initial encounter: Secondary | ICD-10-CM | POA: Insufficient documentation

## 2014-02-17 DIAGNOSIS — F209 Schizophrenia, unspecified: Secondary | ICD-10-CM | POA: Insufficient documentation

## 2014-02-17 DIAGNOSIS — E119 Type 2 diabetes mellitus without complications: Secondary | ICD-10-CM | POA: Insufficient documentation

## 2014-02-17 DIAGNOSIS — F1910 Other psychoactive substance abuse, uncomplicated: Secondary | ICD-10-CM

## 2014-02-17 DIAGNOSIS — M109 Gout, unspecified: Secondary | ICD-10-CM | POA: Insufficient documentation

## 2014-02-17 DIAGNOSIS — Z794 Long term (current) use of insulin: Secondary | ICD-10-CM | POA: Insufficient documentation

## 2014-02-17 DIAGNOSIS — Z9119 Patient's noncompliance with other medical treatment and regimen: Secondary | ICD-10-CM

## 2014-02-17 DIAGNOSIS — Z79899 Other long term (current) drug therapy: Secondary | ICD-10-CM | POA: Insufficient documentation

## 2014-02-17 DIAGNOSIS — F1490 Cocaine use, unspecified, uncomplicated: Secondary | ICD-10-CM

## 2014-02-17 LAB — CBG MONITORING, ED
GLUCOSE-CAPILLARY: 211 mg/dL — AB (ref 70–99)
Glucose-Capillary: 424 mg/dL — ABNORMAL HIGH (ref 70–99)
Glucose-Capillary: 311 mg/dL — ABNORMAL HIGH (ref 70–99)
Glucose-Capillary: 309 mg/dL — ABNORMAL HIGH (ref 70–99)

## 2014-02-17 LAB — CBC
HCT: 43.2 % (ref 39.0–52.0)
Hemoglobin: 15.2 g/dL (ref 13.0–17.0)
MCH: 30.3 pg (ref 26.0–34.0)
MCHC: 35.2 g/dL (ref 30.0–36.0)
MCV: 86.1 fL (ref 78.0–100.0)
Platelets: 242 10*3/uL (ref 150–400)
RBC: 5.02 MIL/uL (ref 4.22–5.81)
RDW: 13.4 % (ref 11.5–15.5)
WBC: 10.3 10*3/uL (ref 4.0–10.5)

## 2014-02-17 LAB — BASIC METABOLIC PANEL
BUN: 21 mg/dL (ref 6–23)
CO2: 23 meq/L (ref 19–32)
Calcium: 8.7 mg/dL (ref 8.4–10.5)
Chloride: 101 mEq/L (ref 96–112)
Creatinine, Ser: 0.94 mg/dL (ref 0.50–1.35)
GFR calc Af Amer: >90 mL/min (ref >90)
Glucose, Bld: 326 mg/dL — ABNORMAL HIGH (ref 70–99)
Potassium: 3.7 mEq/L (ref 3.7–5.3)
SODIUM: 140 meq/L (ref 137–147)

## 2014-02-17 LAB — COMPREHENSIVE METABOLIC PANEL
ALT: 22 U/L (ref 0–53)
AST: 20 U/L (ref 0–37)
Albumin: 4.3 g/dL (ref 3.5–5.2)
Alkaline Phosphatase: 98 U/L (ref 39–117)
BUN: 25 mg/dL — ABNORMAL HIGH (ref 6–23)
CALCIUM: 10.1 mg/dL (ref 8.4–10.5)
CO2: 25 mEq/L (ref 19–32)
CREATININE: 1.09 mg/dL (ref 0.50–1.35)
Chloride: 95 mEq/L — ABNORMAL LOW (ref 96–112)
GFR, EST AFRICAN AMERICAN: 85 mL/min — AB (ref >90)
GFR, EST NON AFRICAN AMERICAN: 73 mL/min — AB (ref >90)
GLUCOSE: 439 mg/dL — AB (ref 70–99)
Potassium: 4 mEq/L (ref 3.7–5.3)
Sodium: 138 mEq/L (ref 137–147)
TOTAL PROTEIN: 8.6 g/dL — AB (ref 6.0–8.3)
Total Bilirubin: 0.4 mg/dL (ref 0.3–1.2)

## 2014-02-17 LAB — RAPID URINE DRUG SCREEN, HOSP PERFORMED
Amphetamines: NOT DETECTED
BENZODIAZEPINES: NOT DETECTED
Barbiturates: NOT DETECTED
COCAINE: POSITIVE — AB
OPIATES: NOT DETECTED
Tetrahydrocannabinol: NOT DETECTED

## 2014-02-17 LAB — ETHANOL

## 2014-02-17 MED ORDER — INSULIN ASPART PROT & ASPART (70-30 MIX) 100 UNIT/ML ~~LOC~~ SUSP
40.0000 [IU] | Freq: Once | SUBCUTANEOUS | Status: AC
  Administered 2014-02-17: 40 [IU] via SUBCUTANEOUS

## 2014-02-17 MED ORDER — METFORMIN HCL 500 MG PO TABS
1000.0000 mg | ORAL_TABLET | Freq: Once | ORAL | Status: AC
  Administered 2014-02-17: 1000 mg via ORAL
  Filled 2014-02-17: qty 2

## 2014-02-17 MED ORDER — METOPROLOL TARTRATE 25 MG PO TABS: 25.0000 mg | ORAL_TABLET | Freq: Every day | ORAL | Status: DC

## 2014-02-17 MED ORDER — SODIUM CHLORIDE 0.9 % IV BOLUS (SEPSIS)
1000.0000 mL | Freq: Once | INTRAVENOUS | Status: AC
  Administered 2014-02-17: 1000 mL via INTRAVENOUS

## 2014-02-17 MED ORDER — INSULIN ASPART PROT & ASPART (70-30 MIX) 100 UNIT/ML ~~LOC~~ SUSP: 50.0000 [IU] | Freq: Two times a day (BID) | SUBCUTANEOUS | Status: DC

## 2014-02-17 MED ORDER — ENALAPRIL MALEATE 20 MG PO TABS
20.0000 mg | ORAL_TABLET | Freq: Every day | ORAL | Status: DC
  Administered 2014-02-17: 20 mg via ORAL
  Filled 2014-02-17: qty 1

## 2014-02-17 MED ORDER — SODIUM CHLORIDE 0.9 % IV BOLUS (SEPSIS): 1000.0000 mL | Freq: Once | INTRAVENOUS | Status: DC

## 2014-02-17 MED ORDER — METFORMIN HCL 500 MG PO TABS
1000.0000 mg | ORAL_TABLET | Freq: Two times a day (BID) | ORAL | Status: DC
  Filled 2014-02-17 (×2): qty 2

## 2014-02-17 MED ORDER — TRAZODONE HCL 50 MG PO TABS: 25.0000 mg | ORAL_TABLET | Freq: Every day | ORAL | Status: DC

## 2014-02-17 MED ORDER — ALLOPURINOL 100 MG PO TABS
100.0000 mg | ORAL_TABLET | Freq: Every day | ORAL | Status: DC
  Administered 2014-02-17: 100 mg via ORAL
  Filled 2014-02-17: qty 1

## 2014-02-17 MED ORDER — RISPERIDONE 0.5 MG PO TABS: 0.5000 mg | ORAL_TABLET | Freq: Two times a day (BID) | ORAL | Status: DC

## 2014-02-17 MED ORDER — INSULIN ASPART 100 UNIT/ML ~~LOC~~ SOLN: 0.0000 [IU] | Freq: Three times a day (TID) | SUBCUTANEOUS | Status: DC

## 2014-02-17 MED ORDER — METOPROLOL TARTRATE 25 MG PO TABS
25.0000 mg | ORAL_TABLET | Freq: Once | ORAL | Status: AC
  Administered 2014-02-17: 25 mg via ORAL
  Filled 2014-02-17: qty 1

## 2014-02-17 MED ORDER — SERTRALINE HCL 50 MG PO TABS
50.0000 mg | ORAL_TABLET | Freq: Every day | ORAL | Status: DC
  Administered 2014-02-17: 50 mg via ORAL
  Filled 2014-02-17: qty 1

## 2014-02-17 MED ORDER — HYDROCHLOROTHIAZIDE 12.5 MG PO CAPS
12.5000 mg | ORAL_CAPSULE | Freq: Every day | ORAL | Status: DC
  Administered 2014-02-17: 12.5 mg via ORAL
  Filled 2014-02-17: qty 1

## 2014-02-17 MED ORDER — INSULIN ASPART 100 UNIT/ML ~~LOC~~ SOLN
20.0000 [IU] | Freq: Once | SUBCUTANEOUS | Status: AC
  Administered 2014-02-17: 20 [IU] via SUBCUTANEOUS
  Filled 2014-02-17: qty 1

## 2014-02-17 MED ORDER — SERTRALINE HCL 50 MG PO TABS: 50.0000 mg | ORAL_TABLET | Freq: Every day | ORAL | Status: DC

## 2014-02-17 MED ORDER — ENALAPRIL MALEATE 20 MG PO TABS
20.0000 mg | ORAL_TABLET | Freq: Once | ORAL | Status: AC
  Administered 2014-02-17: 20 mg via ORAL
  Filled 2014-02-17: qty 1

## 2014-02-17 MED ORDER — INSULIN ASPART PROT & ASPART (70-30 MIX) 100 UNIT/ML ~~LOC~~ SUSP
10.0000 [IU] | Freq: Once | SUBCUTANEOUS | Status: AC
  Administered 2014-02-17: 10 [IU] via SUBCUTANEOUS
  Filled 2014-02-17: qty 10

## 2014-02-17 MED ORDER — RISPERIDONE 0.5 MG PO TABS
0.5000 mg | ORAL_TABLET | Freq: Once | ORAL | Status: AC
  Administered 2014-02-17: 0.5 mg via ORAL
  Filled 2014-02-17: qty 1

## 2014-02-17 MED ORDER — ZIPRASIDONE HCL 20 MG PO CAPS: 20.0000 mg | ORAL_CAPSULE | Freq: Every day | ORAL | Status: DC

## 2014-02-17 MED ORDER — INSULIN NPH (HUMAN) (ISOPHANE) 100 UNIT/ML ~~LOC~~ SUSP: 50.0000 [IU] | Freq: Once | SUBCUTANEOUS | Status: DC

## 2014-02-17 NOTE — ED Notes (Signed)
US at bedside

## 2014-02-17 NOTE — Consult Note (Signed)
[name]   Reason for Delacruz:  Auditory hallucinations Referring Physician:  EDP  [name] is an 58 y.o. male. Total Time spent with patient: 45 minutes  Assessment: AXIS I:  Schizoaffective Disorder, Substance Abuse and Substance Induced Mood Disorder AXIS II:  Deferred AXIS III:   Past Medical History  Diagnosis Date  . Diabetes mellitus   . Hypertension   . Gout   . MI (myocardial infarction)   . Chronic pain     right elbow  . Major depression, chronic   . Bipolar disorder   . Schizophrenia    AXIS IV:  other psychosocial or environmental problems AXIS V:  61-70 mild symptoms  Plan:  No evidence of imminent risk to self or others at present.   Patient does not meet criteria for psychiatric inpatient admission. Supportive therapy provided about ongoing stressors. Discussed crisis plan, support from social network, calling 911, coming to the Emergency Department, and calling Suicide Hotline.  Subjective:   [name] is a 58 y.o. male patient.  HPI:  Patient states that he has been staying in [place] with a friend.  Patient states that he has tried to get his prescription filled but was unable to.  "I went to [place] and they said that I would need to clear it with [facility] first."  Patient states that he has not taken any of his psychotropic medications since being discharged from hospital related to blood sugars.  "So I know that; that medicine is not making my blood sugar go up.  Yes I am taking my insulin like I am suppose to."  Patient states that he wants to remain in [place].  Patient UDS positive for cocaine; but patient denies use. "I was staying in the basement at a house in [place] and the people up stairs are always getting high; I guess that is how it got into my system."  Patient states that he wants to get into a long term treatment program.   Informed patient that he can do that he could do that himself; we  will give the resource and contact information.   Patient denies suicidal/homicidal ideation and paranoia.  Patient does endorse auditory hallucinations at his baseline.  Medication will be given.  Also discussed with patient that drug use could also make auditory hallucinations worse.  Patient agrees that he does not need to go inpatient for psychiatric and is here at the [facility] to get help with blood sugar.  HPI Elements:   Location:  increase blood sugar and auditory hallucinations. Quality:  non command. Severity:  not taking psychotropic medication. Timing:  2 weeks.  Past Psychiatric History: Past Medical History  Diagnosis Date  . Diabetes mellitus   . Hypertension   . Gout   . MI (myocardial infarction)   . Chronic pain     right elbow  . Major depression, chronic   . Bipolar disorder   . Schizophrenia     reports that he quit smoking about 13 months ago. His smoking use included Cigarettes. He smoked 0.00 packs per day for .1 years. He has never used smokeless tobacco. He reports that he does not drink alcohol or use illicit drugs. Family History  Problem Relation Age of Onset  . Diabetic kidney disease Mother   . Hypertension Mother   . Gout Mother   . Diabetic kidney disease Father   . Heart attack Brother 35  Allergies:   Allergies  Allergen Reactions  . Fish Allergy     All seafood makes his throat swell  . Tomato     Cannot breathe  . Lactose Intolerance (Gi)     ACT Assessment Complete:  Yes:    Educational Status    Risk to Self: Risk to self Is patient at risk for suicide?: No Substance abuse history and/or treatment for substance abuse?: No  Risk to Others:    Abuse:    Prior Inpatient Therapy:    Prior Outpatient Therapy:    Additional Information:                    Objective: Blood pressure 158/94, pulse 87, temperature 98.1 F (36.7 C), temperature source Oral, resp. rate 18, height _0  (1.676 m), weight 82.101 kg  (181 lb), SpO2 99.00%.Body mass index is 29.23 kg/(m^2). Results for orders placed during the hospital encounter of 02/17/14 (from the past 72 hour(s))  CBG MONITORING, ED     Status: Abnormal   Collection Time    02/17/14  6:01 AM      Result Value Ref Range   Glucose-Capillary 424 (*) 70 - 99 mg/dL  CBC     Status: None   Collection Time    02/17/14  6:03 AM      Result Value Ref Range   WBC 10.3  4.0 - 10.5 K/uL   RBC 5.02  4.22 - 5.81 MIL/uL   Hemoglobin 15.2  13.0 - 17.0 g/dL   HCT 43.2  39.0 - 52.0 %   MCV 86.1  78.0 - 100.0 fL   MCH 30.3  26.0 - 34.0 pg   MCHC 35.2  30.0 - 36.0 g/dL   RDW 13.4  11.5 - 15.5 %   Platelets 242  150 - 400 K/uL  COMPREHENSIVE METABOLIC PANEL     Status: Abnormal   Collection Time    02/17/14  6:03 AM      Result Value Ref Range   Sodium 138  137 - 147 mEq/L   Potassium 4.0  3.7 - 5.3 mEq/L   Chloride 95 (*) 96 - 112 mEq/L   CO2 25  19 - 32 mEq/L   Glucose, Bld 439 (*) 70 - 99 mg/dL   BUN 25 (*) 6 - 23 mg/dL   Creatinine, Ser 1.09  0.50 - 1.35 mg/dL   Calcium 10.1  8.4 - 10.5 mg/dL   Total Protein 8.6 (*) 6.0 - 8.3 g/dL   Albumin 4.3  3.5 - 5.2 g/dL   AST 20  0 - 37 U/L   ALT 22  0 - 53 U/L   Alkaline Phosphatase 98  39 - 117 U/L   Total Bilirubin 0.4  0.3 - 1.2 mg/dL   GFR calc non Af Amer 73 (*) >90 mL/min   GFR calc Af Amer 85 (*) >90 mL/min   Comment: (NOTE)     The eGFR has been calculated using the CKD EPI equation.     This calculation has not been validated in all clinical situations.     eGFR's persistently <90 mL/min signify possible Chronic Kidney     Disease.  ETHANOL     Status: None   Collection Time    02/17/14  6:03 AM      Result Value Ref Range   Alcohol, Ethyl (B) <11  0 - 11 mg/dL   Comment:  LOWEST DETECTABLE LIMIT FOR     SERUM ALCOHOL IS 11 mg/dL     FOR MEDICAL PURPOSES ONLY  URINE RAPID DRUG SCREEN (HOSP PERFORMED)     Status: Abnormal   Collection Time    02/17/14  6:07 AM      Result Value  Ref Range   Opiates NONE DETECTED  NONE DETECTED   Cocaine POSITIVE (*) NONE DETECTED   Benzodiazepines NONE DETECTED  NONE DETECTED   Amphetamines NONE DETECTED  NONE DETECTED   Tetrahydrocannabinol NONE DETECTED  NONE DETECTED   Barbiturates NONE DETECTED  NONE DETECTED   Comment:            DRUG SCREEN FOR MEDICAL PURPOSES     ONLY.  IF CONFIRMATION IS NEEDED     FOR ANY PURPOSE, NOTIFY LAB     WITHIN 5 DAYS.                LOWEST DETECTABLE LIMITS     FOR URINE DRUG SCREEN     Drug Class       Cutoff (ng/mL)     Amphetamine      1000     Barbiturate      200     Benzodiazepine   321     Tricyclics       224     Opiates          300     Cocaine          300     THC              50  CBG MONITORING, ED     Status: Abnormal   Collection Time    02/17/14  8:10 AM      Result Value Ref Range   Glucose-Capillary 311 (*) 70 - 99 mg/dL  BASIC METABOLIC PANEL     Status: Abnormal   Collection Time    02/17/14  9:06 AM      Result Value Ref Range   Sodium 140  137 - 147 mEq/L   Potassium 3.7  3.7 - 5.3 mEq/L   Chloride 101  96 - 112 mEq/L   CO2 23  19 - 32 mEq/L   Glucose, Bld 326 (*) 70 - 99 mg/dL   BUN 21  6 - 23 mg/dL   Creatinine, Ser 0.94  0.50 - 1.35 mg/dL   Calcium 8.7  8.4 - 10.5 mg/dL   GFR calc non Af Amer >90  >90 mL/min   GFR calc Af Amer >90  >90 mL/min   Comment: (NOTE)     The eGFR has been calculated using the CKD EPI equation.     This calculation has not been validated in all clinical situations.     eGFR's persistently <90 mL/min signify possible Chronic Kidney     Disease.  CBG MONITORING, ED     Status: Abnormal   Collection Time    02/17/14 12:14 PM      Result Value Ref Range   Glucose-Capillary 309 (*) 70 - 99 mg/dL  CBG MONITORING, ED     Status: Abnormal   Collection Time    02/17/14  1:28 PM      Result Value Ref Range   Glucose-Capillary 211 (*) 70 - 99 mg/dL   Labs are reviewed elevated blood sugar level.  Medications reviewed.  Patient  Risperdal discontinued and a prescription for Geodon 20 mg Q hs written  Current Facility-Administered Medications  Medication Dose Route Frequency Provider Last Rate Last Dose  . allopurinol (ZYLOPRIM) tablet 100 mg  100 mg Oral Daily Lauren Burnetta Sabin, PA-C      . enalapril (VASOTEC) tablet 20 mg  20 mg Oral Daily Lauren Burnetta Sabin, PA-C      . hydrochlorothiazide (MICROZIDE) capsule 12.5 mg  12.5 mg Oral Daily Lauren Burnetta Sabin, PA-C      . insulin aspart (novoLOG) injection 0-15 Units  0-15 Units Subcutaneous TID WC Lauren Burnetta Sabin, PA-C      . insulin aspart protamine- aspart (NOVOLOG MIX 70/30) injection 50 Units  50 Units Subcutaneous BID WC Lauren Burnetta Sabin, PA-C      . metFORMIN (GLUCOPHAGE) tablet 1,000 mg  1,000 mg Oral BID WC Lauren Burnetta Sabin, PA-C      . metoprolol tartrate (LOPRESSOR) tablet 25 mg  25 mg Oral Daily Lauren Burnetta Sabin, PA-C      . risperiDONE (RISPERDAL) tablet 0.5 mg  0.5 mg Oral BID Lauren Burnetta Sabin, PA-C      . sertraline (ZOLOFT) tablet 50 mg  50 mg Oral Daily Lorrine Kin, PA-C   50 mg at 02/17/14 1118  . sertraline (ZOLOFT) tablet 50 mg  50 mg Oral Daily Lauren Burnetta Sabin, PA-C      . traZODone (DESYREL) tablet 25 mg  25 mg Oral QHS Lauren Burnetta Sabin, PA-C       Current Outpatient Prescriptions  Medication Sig Dispense Refill  . allopurinol (ZYLOPRIM) 100 MG tablet Take 1 tablet (100 mg total) by mouth daily.  30 tablet  0  . colchicine 0.6 MG tablet Take 1 tablet (0.6 mg total) by mouth 2 (two) times daily.  60 tablet  0  . enalapril (VASOTEC) 20 MG tablet Take 1 tablet (20 mg total) by mouth daily.      . hydrochlorothiazide (MICROZIDE) 12.5 MG capsule Take 1 capsule (12.5 mg total) by mouth daily.      . insulin NPH-regular Human (NOVOLIN 70/30) (70-30) 100 UNIT/ML injection Inject 50 Units into the skin 2 (two) times daily with a meal.  10 mL  11  . insulin regular (NOVOLIN R,HUMULIN R) 100 units/mL injection Inject 0.02-0.15 mLs (2-15 Units total) into the skin 3  (three) times daily before meals. Sliding scale insulin  10 mL  11  . metFORMIN (GLUCOPHAGE) 1000 MG tablet Take 1 tablet (1,000 mg total) by mouth 2 (two) times daily with a meal.  60 tablet  1  . metoprolol tartrate (LOPRESSOR) 25 MG tablet Take 25 mg by mouth daily.      . risperiDONE (RISPERDAL) 0.5 MG tablet Take 1 tablet (0.5 mg total) by mouth 2 (two) times daily.  60 tablet  0  . sertraline (ZOLOFT) 50 MG tablet Take 50 mg by mouth daily.      . traZODone (DESYREL) 25 mg TABS tablet Take 0.5 tablets (25 mg total) by mouth at bedtime.  30 tablet  0    Psychiatric Specialty Exam:     Blood pressure 158/94, pulse 87, temperature 98.1 F (36.7 C), temperature source Oral, resp. rate 18, height _0  (1.676 m), weight 82.101 kg (181 lb), SpO2 99.00%.Body mass index is 29.23 kg/(m^2).  General Appearance: Casual  Eye Contact::  Good  Speech:  Clear and Coherent and Normal Rate  Volume:  Normal  Mood:  "I feel bad; don't know why blood sugar keeps going up"  Affect:  Appropriate and Congruent  Thought Process:  Circumstantial, Coherent and  Goal Directed  Orientation:  Full (Time, Place, and Person)  Thought Content:  "I came cause my blood sugar was up"  Suicidal Thoughts:  No  Homicidal Thoughts:  No  Memory:  Immediate;   Good Recent;   Good  Judgement:  Fair  Insight:  Present  Psychomotor Activity:  Normal  Concentration:  Good  Recall:  Good  Fund of Knowledge:Good  Language: Good  Akathisia:  No  Handed:  Right  AIMS (if indicated):     Assets:  Communication Skills Desire for Improvement  Sleep:      Musculoskeletal: Strength & Muscle Tone: within normal limits Gait & Station: normal Patient leans: N/A  Treatment Plan Summary: Follow up with [facility]  Disposition:  Patient is cleared psychiatrically.  Patient was given a prescription for Geodon 20 mg Q hs and will follow up with [facility] for psychiatric needs.  EDP to clear medically and can discharge when  cleared.    Henlee Donovan, FNP-BC 02/17/2014 2:57 PM

## 2014-02-17 NOTE — ED Notes (Signed)
[place] and stopped campus police and they called [employer], pt states that he's been off his meds for three weeks and he's starting to hear voices, denies SI or HI at this time
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
## 2014-02-17 NOTE — Discharge Instructions (Signed)
Call for a follow up appointment with a Family or Primary Care Provider for further management of your blood glucose levels.  Call [facility] or another Behavioral health specialist for further evaluation of your psychiatric medications.  Return if Symptoms worsen.   Take medication as prescribed.

## 2014-02-17 NOTE — ED Notes (Signed)
After speaking with PA, patient now agrees to plan of care

## 2014-02-17 NOTE — ED Notes (Signed)
Patient transported to X-ray 

## 2014-02-17 NOTE — ED Notes (Signed)
Pt getting dressed. Will walk pt to discharge window.

## 2014-02-17 NOTE — ED Notes (Signed)
Pt alert and oriented x4. Respirations even and unlabored, bilateral symmetrical rise and fall of chest. Skin warm and dry. In no acute distress. Denies needs.   

## 2014-02-17 NOTE — Progress Notes (Signed)
Per, Dr. Ladona Ridgelaylor the patient is psychiatrically stable and will be discharged.  The nurse was working with another patient.  The Unit Secretary will give the nurse the information.

## 2014-02-17 NOTE — ED Notes (Addendum)
Entered patient room to review plan of care Patient declined PIV placement and NS, states "Regular insulin will work just fine." PA made aware

## 2014-02-17 NOTE — ED Notes (Addendum)
Pt has in belonging bags: black hat, white jeans, purple boxers, white t-shirt, blue shirt, brown socks, black shoes, one dollar bill, coins, and white envelope in locker # 26.  Pt has his gray glasses cases and glasses at bedside.

## 2014-02-17 NOTE — ED Notes (Signed)
Chaplain saw pt on rounds in ED.  Pt alert and oriented.  Introduced spiritual care as resource, provided support at bedside and assisted pt in getting lunch.    Will continue to follow for support [facility].    Justin MealyMatthew Delacruz Itzayana Pardy MDiv
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
## 2014-02-19 NOTE — ED Provider Notes (Signed)
Medical screening examination/treatment/procedure(s) were performed by non-physician practitioner and as supervising physician I was immediately available for consultation/collaboration.   EKG Interpretation None       Ella Guillotte M Ymani Porcher, MD 02/19/14 0612 

## 2014-02-22 NOTE — Consult Note (Signed)
Face to face evaluation and I agree with this note 

## 2014-06-21 DIAGNOSIS — I609 Nontraumatic subarachnoid hemorrhage, unspecified: Secondary | ICD-10-CM

## 2014-06-21 DIAGNOSIS — J9600 Acute respiratory failure, unspecified whether with hypoxia or hypercapnia: Secondary | ICD-10-CM

## 2014-06-21 HISTORY — DX: Acute respiratory failure, unspecified whether with hypoxia or hypercapnia: J96.00

## 2014-06-21 HISTORY — DX: Nontraumatic subarachnoid hemorrhage, unspecified: I60.9

## 2014-07-16 ENCOUNTER — Inpatient Hospital Stay (HOSPITAL_COMMUNITY)
Admission: EM | Admit: 2014-07-16 | Discharge: 2014-08-02 | DRG: 064 | Disposition: A | Discharge status: Skilled Nursing Facility | Payer: Medicaid Other | Attending: Internal Medicine | Admitting: Internal Medicine

## 2014-07-16 ENCOUNTER — Emergency Department (HOSPITAL_COMMUNITY): Payer: Medicaid Other

## 2014-07-16 ENCOUNTER — Inpatient Hospital Stay (HOSPITAL_COMMUNITY): Payer: Medicaid Other

## 2014-07-16 ENCOUNTER — Encounter (HOSPITAL_COMMUNITY): Payer: Self-pay | Admitting: Emergency Medicine

## 2014-07-16 DIAGNOSIS — F1410 Cocaine abuse, uncomplicated: Secondary | ICD-10-CM | POA: Diagnosis present

## 2014-07-16 DIAGNOSIS — IMO0002 Reserved for concepts with insufficient information to code with codable children: Secondary | ICD-10-CM

## 2014-07-16 DIAGNOSIS — Z8249 Family history of ischemic heart disease and other diseases of the circulatory system: Secondary | ICD-10-CM | POA: Diagnosis not present

## 2014-07-16 DIAGNOSIS — F1494 Cocaine use, unspecified with cocaine-induced mood disorder: Secondary | ICD-10-CM

## 2014-07-16 DIAGNOSIS — Z91018 Allergy to other foods: Secondary | ICD-10-CM

## 2014-07-16 DIAGNOSIS — Z9119 Patient's noncompliance with other medical treatment and regimen: Secondary | ICD-10-CM

## 2014-07-16 DIAGNOSIS — G8929 Other chronic pain: Secondary | ICD-10-CM | POA: Diagnosis present

## 2014-07-16 DIAGNOSIS — E876 Hypokalemia: Secondary | ICD-10-CM | POA: Diagnosis not present

## 2014-07-16 DIAGNOSIS — F411 Generalized anxiety disorder: Secondary | ICD-10-CM | POA: Diagnosis present

## 2014-07-16 DIAGNOSIS — R300 Dysuria: Secondary | ICD-10-CM | POA: Diagnosis not present

## 2014-07-16 DIAGNOSIS — F14259 Cocaine dependence with cocaine-induced psychotic disorder, unspecified: Secondary | ICD-10-CM

## 2014-07-16 DIAGNOSIS — E871 Hypo-osmolality and hyponatremia: Secondary | ICD-10-CM | POA: Diagnosis present

## 2014-07-16 DIAGNOSIS — J9600 Acute respiratory failure, unspecified whether with hypoxia or hypercapnia: Secondary | ICD-10-CM

## 2014-07-16 DIAGNOSIS — E739 Lactose intolerance, unspecified: Secondary | ICD-10-CM | POA: Diagnosis present

## 2014-07-16 DIAGNOSIS — B957 Other staphylococcus as the cause of diseases classified elsewhere: Secondary | ICD-10-CM

## 2014-07-16 DIAGNOSIS — Z9114 Patient's other noncompliance with medication regimen: Secondary | ICD-10-CM | POA: Diagnosis present

## 2014-07-16 DIAGNOSIS — E1165 Type 2 diabetes mellitus with hyperglycemia: Secondary | ICD-10-CM | POA: Diagnosis present

## 2014-07-16 DIAGNOSIS — R40244 Other coma, without documented Glasgow coma scale score, or with partial score reported, unspecified time: Secondary | ICD-10-CM

## 2014-07-16 DIAGNOSIS — F39 Unspecified mood [affective] disorder: Secondary | ICD-10-CM | POA: Diagnosis present

## 2014-07-16 DIAGNOSIS — Z794 Long term (current) use of insulin: Secondary | ICD-10-CM

## 2014-07-16 DIAGNOSIS — R079 Chest pain, unspecified: Secondary | ICD-10-CM

## 2014-07-16 DIAGNOSIS — G4700 Insomnia, unspecified: Secondary | ICD-10-CM | POA: Diagnosis present

## 2014-07-16 DIAGNOSIS — Z955 Presence of coronary angioplasty implant and graft: Secondary | ICD-10-CM | POA: Diagnosis not present

## 2014-07-16 DIAGNOSIS — E222 Syndrome of inappropriate secretion of antidiuretic hormone: Secondary | ICD-10-CM | POA: Diagnosis present

## 2014-07-16 DIAGNOSIS — M109 Gout, unspecified: Secondary | ICD-10-CM | POA: Diagnosis present

## 2014-07-16 DIAGNOSIS — R7881 Bacteremia: Secondary | ICD-10-CM

## 2014-07-16 DIAGNOSIS — R45851 Suicidal ideations: Secondary | ICD-10-CM | POA: Diagnosis present

## 2014-07-16 DIAGNOSIS — D638 Anemia in other chronic diseases classified elsewhere: Secondary | ICD-10-CM | POA: Diagnosis present

## 2014-07-16 DIAGNOSIS — Z91013 Allergy to seafood: Secondary | ICD-10-CM

## 2014-07-16 DIAGNOSIS — E236 Other disorders of pituitary gland: Secondary | ICD-10-CM | POA: Diagnosis not present

## 2014-07-16 DIAGNOSIS — E119 Type 2 diabetes mellitus without complications: Secondary | ICD-10-CM

## 2014-07-16 DIAGNOSIS — I10 Essential (primary) hypertension: Secondary | ICD-10-CM | POA: Diagnosis present

## 2014-07-16 DIAGNOSIS — I252 Old myocardial infarction: Secondary | ICD-10-CM

## 2014-07-16 DIAGNOSIS — Z87891 Personal history of nicotine dependence: Secondary | ICD-10-CM

## 2014-07-16 DIAGNOSIS — F209 Schizophrenia, unspecified: Secondary | ICD-10-CM | POA: Diagnosis present

## 2014-07-16 DIAGNOSIS — I2510 Atherosclerotic heart disease of native coronary artery without angina pectoris: Secondary | ICD-10-CM | POA: Diagnosis present

## 2014-07-16 DIAGNOSIS — R739 Hyperglycemia, unspecified: Secondary | ICD-10-CM

## 2014-07-16 DIAGNOSIS — K5900 Constipation, unspecified: Secondary | ICD-10-CM | POA: Diagnosis not present

## 2014-07-16 DIAGNOSIS — E43 Unspecified severe protein-calorie malnutrition: Secondary | ICD-10-CM | POA: Insufficient documentation

## 2014-07-16 DIAGNOSIS — F329 Major depressive disorder, single episode, unspecified: Secondary | ICD-10-CM

## 2014-07-16 DIAGNOSIS — I609 Nontraumatic subarachnoid hemorrhage, unspecified: Secondary | ICD-10-CM | POA: Diagnosis present

## 2014-07-16 DIAGNOSIS — Z590 Homelessness: Secondary | ICD-10-CM

## 2014-07-16 DIAGNOSIS — R1084 Generalized abdominal pain: Secondary | ICD-10-CM

## 2014-07-16 DIAGNOSIS — F332 Major depressive disorder, recurrent severe without psychotic features: Secondary | ICD-10-CM | POA: Diagnosis present

## 2014-07-16 DIAGNOSIS — R109 Unspecified abdominal pain: Secondary | ICD-10-CM

## 2014-07-16 DIAGNOSIS — Z91199 Patient's noncompliance with other medical treatment and regimen due to unspecified reason: Secondary | ICD-10-CM

## 2014-07-16 DIAGNOSIS — R440 Auditory hallucinations: Secondary | ICD-10-CM

## 2014-07-16 LAB — CBC WITH DIFFERENTIAL/PLATELET
BASOS ABS: 0 10*3/uL (ref 0.0–0.1)
Basophils Relative: 0 % (ref 0–1)
EOS ABS: 0 10*3/uL (ref 0.0–0.7)
Eosinophils Relative: 0 % (ref 0–5)
HCT: 36.8 % — ABNORMAL LOW (ref 39.0–52.0)
Hemoglobin: 12.8 g/dL — ABNORMAL LOW (ref 13.0–17.0)
LYMPHS ABS: 1.5 10*3/uL (ref 0.7–4.0)
Lymphocytes Relative: 11 % — ABNORMAL LOW (ref 12–46)
MCH: 29 pg (ref 26.0–34.0)
MCHC: 34.8 g/dL (ref 30.0–36.0)
MCV: 83.4 fL (ref 78.0–100.0)
Monocytes Absolute: 0.9 10*3/uL (ref 0.1–1.0)
Monocytes Relative: 7 % (ref 3–12)
NEUTROS PCT: 82 % — AB (ref 43–77)
Neutro Abs: 11.3 10*3/uL — ABNORMAL HIGH (ref 1.7–7.7)
PLATELETS: 239 10*3/uL (ref 150–400)
RBC: 4.41 MIL/uL (ref 4.22–5.81)
RDW: 13 % (ref 11.5–15.5)
WBC: 13.8 10*3/uL — ABNORMAL HIGH (ref 4.0–10.5)

## 2014-07-16 LAB — COMPREHENSIVE METABOLIC PANEL
ALBUMIN: 3.3 g/dL — AB (ref 3.5–5.2)
ALK PHOS: 94 U/L (ref 39–117)
ALT: 5 U/L (ref 0–53)
AST: 10 U/L (ref 0–37)
Anion gap: 16 — ABNORMAL HIGH (ref 5–15)
BUN: 16 mg/dL (ref 6–23)
CALCIUM: 8.5 mg/dL (ref 8.4–10.5)
CO2: 23 mEq/L (ref 19–32)
Chloride: 96 mEq/L (ref 96–112)
Creatinine, Ser: 0.79 mg/dL (ref 0.50–1.35)
GFR calc Af Amer: >90 mL/min (ref >90)
GFR calc non Af Amer: >90 mL/min (ref >90)
GLUCOSE: 439 mg/dL — AB (ref 70–99)
POTASSIUM: 3.3 meq/L — AB (ref 3.7–5.3)
SODIUM: 135 meq/L — AB (ref 137–147)
TOTAL PROTEIN: 7.5 g/dL (ref 6.0–8.3)
Total Bilirubin: 0.3 mg/dL (ref 0.3–1.2)

## 2014-07-16 LAB — RAPID URINE DRUG SCREEN, HOSP PERFORMED
AMPHETAMINES: NOT DETECTED
BARBITURATES: NOT DETECTED
BENZODIAZEPINES: NOT DETECTED
Cocaine: POSITIVE — AB
OPIATES: NOT DETECTED
TETRAHYDROCANNABINOL: NOT DETECTED

## 2014-07-16 LAB — TROPONIN I: Troponin I: <0.3 ng/mL (ref <0.30)

## 2014-07-16 LAB — ETHANOL: Alcohol, Ethyl (B): <11 mg/dL (ref 0–11)

## 2014-07-16 LAB — MRSA PCR SCREENING: MRSA by PCR: NEGATIVE

## 2014-07-16 LAB — LIPASE, BLOOD: Lipase: 51 U/L (ref 11–59)

## 2014-07-16 LAB — PROTIME-INR
INR: 0.95 (ref 0.00–1.49)
Prothrombin Time: 12.7 seconds (ref 11.6–15.2)

## 2014-07-16 MED ORDER — NICARDIPINE HCL IN NACL 20-0.86 MG/200ML-% IV SOLN
3.0000 mg/h | Freq: Once | INTRAVENOUS | Status: AC
  Administered 2014-07-16: 3 mg/h via INTRAVENOUS
  Filled 2014-07-16: qty 200

## 2014-07-16 MED ORDER — LORAZEPAM 2 MG/ML IJ SOLN
0.5000 mg | Freq: Once | INTRAMUSCULAR | Status: AC
  Administered 2014-07-16: 0.5 mg via INTRAVENOUS
  Filled 2014-07-16: qty 1

## 2014-07-16 MED ORDER — PANTOPRAZOLE SODIUM 40 MG IV SOLR
40.0000 mg | Freq: Every day | INTRAVENOUS | Status: DC
  Administered 2014-07-16 – 2014-07-17 (×2): 40 mg via INTRAVENOUS
  Filled 2014-07-16 (×3): qty 40

## 2014-07-16 MED ORDER — IOHEXOL 350 MG/ML SOLN
50.0000 mL | Freq: Once | INTRAVENOUS | Status: AC | PRN
  Administered 2014-07-16: 50 mL via INTRAVENOUS

## 2014-07-16 MED ORDER — SODIUM CHLORIDE 0.9 % IV SOLN: 250.0000 mL | INTRAVENOUS | Status: DC | PRN

## 2014-07-16 MED ORDER — SODIUM CHLORIDE 0.9 % IV BOLUS (SEPSIS)
1000.0000 mL | Freq: Once | INTRAVENOUS | Status: AC
  Administered 2014-07-16: 1000 mL via INTRAVENOUS

## 2014-07-16 MED ORDER — NICARDIPINE HCL IN NACL 20-0.86 MG/200ML-% IV SOLN
3.0000 mg/h | Freq: Once | INTRAVENOUS | Status: AC
  Administered 2014-07-17: 3 mg/h via INTRAVENOUS
  Filled 2014-07-16: qty 200
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
## 2014-07-16 NOTE — ED Notes (Signed)
Pt on bedside commode.
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
## 2014-07-16 NOTE — ED Notes (Signed)
Pt denies chest pain, SOB, or any other sx other than nausea and posterior neck pain/ stiffness
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
## 2014-07-16 NOTE — ED Notes (Signed)
Cardene not started d/t systolic pressure under 180.

## 2014-07-17 DIAGNOSIS — I10 Essential (primary) hypertension: Secondary | ICD-10-CM

## 2014-07-17 DIAGNOSIS — J9600 Acute respiratory failure, unspecified whether with hypoxia or hypercapnia: Secondary | ICD-10-CM | POA: Diagnosis present

## 2014-07-17 DIAGNOSIS — I609 Nontraumatic subarachnoid hemorrhage, unspecified: Secondary | ICD-10-CM

## 2014-07-17 DIAGNOSIS — R4020 Unspecified coma: Secondary | ICD-10-CM

## 2014-07-17 LAB — BASIC METABOLIC PANEL
Anion gap: 16 — ABNORMAL HIGH (ref 5–15)
BUN: 13 mg/dL (ref 6–23)
CALCIUM: 9 mg/dL (ref 8.4–10.5)
CO2: 24 mEq/L (ref 19–32)
CREATININE: 0.73 mg/dL (ref 0.50–1.35)
Chloride: 99 mEq/L (ref 96–112)
GFR calc non Af Amer: >90 mL/min (ref >90)
GLUCOSE: 259 mg/dL — AB (ref 70–99)
POTASSIUM: 3.2 meq/L — AB (ref 3.7–5.3)
Sodium: 139 mEq/L (ref 137–147)

## 2014-07-17 LAB — GLUCOSE, CAPILLARY
Glucose-Capillary: 151 mg/dL — ABNORMAL HIGH (ref 70–99)
Glucose-Capillary: 175 mg/dL — ABNORMAL HIGH (ref 70–99)
Glucose-Capillary: 216 mg/dL — ABNORMAL HIGH (ref 70–99)
Glucose-Capillary: 268 mg/dL — ABNORMAL HIGH (ref 70–99)
Glucose-Capillary: 316 mg/dL — ABNORMAL HIGH (ref 70–99)

## 2014-07-17 LAB — PHOSPHORUS: Phosphorus: 4.1 mg/dL (ref 2.3–4.6)

## 2014-07-17 LAB — CBC
HCT: 37.7 % — ABNORMAL LOW (ref 39.0–52.0)
Hemoglobin: 13.5 g/dL (ref 13.0–17.0)
MCH: 29.2 pg (ref 26.0–34.0)
MCHC: 35.8 g/dL (ref 30.0–36.0)
MCV: 81.4 fL (ref 78.0–100.0)
PLATELETS: 233 10*3/uL (ref 150–400)
RBC: 4.63 MIL/uL (ref 4.22–5.81)
RDW: 13 % (ref 11.5–15.5)
WBC: 9.9 10*3/uL (ref 4.0–10.5)

## 2014-07-17 LAB — MAGNESIUM: Magnesium: 1.8 mg/dL (ref 1.5–2.5)

## 2014-07-17 MED ORDER — MAGNESIUM SULFATE 40 MG/ML IJ SOLN
2.0000 g | Freq: Once | INTRAMUSCULAR | Status: AC
  Administered 2014-07-17: 2 g via INTRAVENOUS
  Filled 2014-07-17: qty 50

## 2014-07-17 MED ORDER — FENTANYL CITRATE 0.05 MG/ML IJ SOLN
25.0000 ug | INTRAMUSCULAR | Status: DC | PRN
  Administered 2014-07-17 (×2): 50 ug via INTRAVENOUS
  Administered 2014-07-18: 100 ug via INTRAVENOUS
  Administered 2014-07-18: 50 ug via INTRAVENOUS
  Administered 2014-07-19: 100 ug via INTRAVENOUS
  Administered 2014-07-19 (×2): 50 ug via INTRAVENOUS
  Administered 2014-07-20 (×2): 100 ug via INTRAVENOUS
  Administered 2014-07-20 (×2): 50 ug via INTRAVENOUS
  Administered 2014-07-21 – 2014-07-22 (×6): 100 ug via INTRAVENOUS
  Administered 2014-07-24: 25 ug via INTRAVENOUS
  Administered 2014-07-27 – 2014-07-31 (×2): 100 ug via INTRAVENOUS
  Filled 2014-07-17 (×23): qty 2

## 2014-07-17 MED ORDER — NICARDIPINE HCL IN NACL 20-0.86 MG/200ML-% IV SOLN
3.0000 mg/h | Freq: Once | INTRAVENOUS | Status: AC
  Administered 2014-07-17: 3 mg/h via INTRAVENOUS

## 2014-07-17 MED ORDER — INSULIN ASPART 100 UNIT/ML ~~LOC~~ SOLN
0.0000 [IU] | SUBCUTANEOUS | Status: DC
  Administered 2014-07-17: 11 [IU] via SUBCUTANEOUS
  Administered 2014-07-17: 3 [IU] via SUBCUTANEOUS
  Administered 2014-07-17: 5 [IU] via SUBCUTANEOUS
  Administered 2014-07-17: 3 [IU] via SUBCUTANEOUS

## 2014-07-17 MED ORDER — NIMODIPINE 30 MG PO CAPS
60.0000 mg | ORAL_CAPSULE | ORAL | Status: DC
  Administered 2014-07-17 – 2014-07-19 (×15): 60 mg via ORAL
  Filled 2014-07-17 (×21): qty 2

## 2014-07-17 MED ORDER — POTASSIUM CHLORIDE CRYS ER 20 MEQ PO TBCR
30.0000 meq | EXTENDED_RELEASE_TABLET | ORAL | Status: AC
  Administered 2014-07-17 (×2): 30 meq via ORAL
  Filled 2014-07-17 (×4): qty 1

## 2014-07-17 MED ORDER — INSULIN ASPART 100 UNIT/ML ~~LOC~~ SOLN
0.0000 [IU] | Freq: Three times a day (TID) | SUBCUTANEOUS | Status: DC
  Administered 2014-07-17: 8 [IU] via SUBCUTANEOUS
  Administered 2014-07-18: 5 [IU] via SUBCUTANEOUS
  Administered 2014-07-18 (×2): 15 [IU] via SUBCUTANEOUS

## 2014-07-17 MED ORDER — NICARDIPINE HCL IN NACL 20-0.86 MG/200ML-% IV SOLN
3.0000 mg/h | INTRAVENOUS | Status: DC
  Administered 2014-07-17 (×3): 5 mg/h via INTRAVENOUS
  Administered 2014-07-18: 3 mg/h via INTRAVENOUS
  Filled 2014-07-17 (×6): qty 200

## 2014-07-17 MED ORDER — POTASSIUM CHLORIDE 20 MEQ/15ML (10%) PO LIQD: 40.0000 meq | Freq: Three times a day (TID) | ORAL | Status: DC

## 2014-07-17 NOTE — Consult Note (Signed)
PULMONARY / CRITICAL CARE MEDICINE HISTORY AND PHYSICAL EXAMINATION   Name: [name] MRN: [id number] DOB: 17-Jul-1956    ADMISSION DATE:  07/16/2014  PRIMARY SERVICE: PCCM  CHIEF COMPLAINT:  AMS  BRIEF PATIENT DESCRIPTION: 52 M with SAH in setting of cocaine use. PCCM asked to admit.  SIGNIFICANT EVENTS / STUDIES:  Head CT 9/26: Primarily posterior SAH  LINES / TUBES: PIV  CULTURES: None  ANTIBIOTICS: None  SUBJECTIVE: No events overnight, intubated and poorly responsive.  VITAL SIGNS: Temp:  [98.4 F (36.9 C)-98.7 F (37.1 C)] 98.7 F (37.1 C) (09/27 0800) Pulse Rate:  [85-115] 90 (09/27 1000) Resp:  [12-25] 19 (09/27 1000) BP: (131-202)/(58-101) 137/74 mmHg (09/27 1000) SpO2:  [96 %-100 %] 100 % (09/27 1000) Weight:  [66 kg (145 lb 8.1 oz)-81.647 kg (180 lb)] 66 kg (145 lb 8.1 oz) (09/27 0500) HEMODYNAMICS:   VENTILATOR SETTINGS:   INTAKE / OUTPUT: Intake/Output     09/26 0701 - 09/27 0700 09/27 0701 - 09/28 0700   I.V. (mL/kg) 212.5 (3.2)    Other 10 20   IV Piggyback 50    Total Intake(mL/kg) 272.5 (4.1) 20 (0.3)   Urine (mL/kg/hr) 1600    Total Output 1600     Net -1327.5 +20          PHYSICAL EXAMINATION: General:  Thin M resting in bed Neuro:  Unresponsive this AM but withdraws to pain HEENT:  Patient unwilling to open eyes and resists attempts, MMM,OP Clear Neck: Trachea supple and midline, (-) LAN or JVD Cardiovascular:  RRR, NS1/S2, (-) MRG Lungs:  CTAB Abdomen:  S/NT/ND/(+)BS Musculoskeletal:  (-) C/C/E Skin:  (-) Rashes  LABS:  CBC  Recent Labs Lab 07/16/14 1846 07/17/14 0256  WBC 13.8* 9.9  HGB 12.8* 13.5  HCT 36.8* 37.7*  PLT 239 233   Coag's  Recent Labs Lab 07/16/14 2002  INR 0.95   BMET  Recent Labs Lab 07/16/14 1846 07/17/14 0256  NA 135* 139  K 3.3* 3.2*  CL 96 99  CO2 23 24  BUN 16 13  CREATININE 0.79 0.73  GLUCOSE 439* 259*   Electrolytes  Recent Labs Lab 07/16/14 1846 07/17/14 0256   CALCIUM 8.5 9.0  MG  --  1.8  PHOS  --  4.1   Sepsis Markers No results found for this basename: LATICACIDVEN, PROCALCITON, O2SATVEN,  in the last 168 hours ABG No results found for this basename: PHART, PCO2ART, PO2ART,  in the last 168 hours Liver Enzymes  Recent Labs Lab 07/16/14 1846  AST 10  ALT 5  ALKPHOS 94  BILITOT 0.3  ALBUMIN 3.3*   Cardiac Enzymes  Recent Labs Lab 07/16/14 1846  TROPONINI <0.30   Glucose  Recent Labs Lab 07/17/14 0033 07/17/14 0420 07/17/14 0807  GLUCAP 316* 216* 151*    Imaging Ct Angio Head W/cm &/or Wo Cm  07/16/2014   CLINICAL DATA:  Subarachnoid hemorrhage.  EXAM: CT ANGIOGRAPHY HEAD  TECHNIQUE: Multidetector CT imaging of the head was performed using the standard protocol during bolus administration of intravenous contrast. Multiplanar CT image reconstructions and MIPs were obtained to evaluate the vascular anatomy.  CONTRAST:  50mL OMNIPAQUE IOHEXOL 350 MG/ML SOLN  COMPARISON:  None.  FINDINGS: Acute subarachnoid hemorrhage within the basilar cisterns and posterior fossa a is grossly stable in amount as compared to prior study. Hemorrhage again seen within the fourth ventricle, not significantly changed. Ventriculomegaly suggestive of early hydrocephalus is not significantly changed.  ANTERIOR CIRCULATION:  Visualized  portions of the distal cervical segments of the internal carotid arteries are widely patent and are normal caliber. Prominent atherosclerotic calcifications present within the cavernous segments of the internal carotid arteries bilaterally. There is associated stenosis of up to 70% approximately 70% bilaterally. Supra clinoid segments within normal limits.  The A1 segments, anterior communicating artery, and anterior cerebral arteries well opacified with widely patent antegrade flow. No A-comm aneurysm.  Mild multi focal atherosclerotic irregular seen within the M1 segments bilaterally without hemodynamically significant  stenosis. No aneurysm present at the MCA bifurcations. Distal MCA branches well opacified.  POSTERIOR CIRCULATION:  The right vertebral artery is dominant with nonvisualization of the left vertebral artery, which may be occluded proximally. Atherosclerotic irregularity present within the distal right vertebral artery with approximately 30% stenosis. Vertebrobasilar junction unremarkable. Basilar artery well opacified. No basilar tip aneurysm. Diffuse multi focal irregularity seen within the posterior cerebral arteries and superior cerebral arteries noted without hemodynamically significant stenosis.  IMPRESSION: 1. No significant interval change an acute subarachnoid hemorrhage involving the basilar cisterns, posterior fossa, and fourth ventricle. 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 2. No aneurysm, vascular malformation, or other abnormality identified within the intracranial circulation to explain subarachnoid hemorrhage. 3. Nonvisualization of the left vertebral artery, which may be occluded proximally. 4. Heavy multi focal atherosclerotic irregularity within the cavernous segments of the internal carotid arteries bilaterally without to 70% stenosis. 5. Atherosclerotic plaque within the distal right vertebral artery with associated 30% short segment stenosis.   Electronically Signed   By: Rise Mu M.D.   On: 07/16/2014 23:20   Dg Chest 1 View  07/16/2014   CLINICAL DATA:  Hypertension.  EXAM: CHEST - 1 VIEW  COMPARISON:  February 17, 2014.  FINDINGS: The heart size and mediastinal contours are within normal limits. Both lungs are clear. No pneumothorax or pleural effusion is noted. Old left rib fractures are noted.  IMPRESSION: No acute cardiopulmonary abnormality seen.   Electronically Signed   By: Roque Lias M.D.   On: 07/16/2014 18:43   Ct Head Wo Contrast  07/16/2014   CLINICAL DATA:  Left-sided weakness.  Dizziness.  EXAM: CT  HEAD WITHOUT CONTRAST  TECHNIQUE: Contiguous axial images were obtained from the base of the skull through the vertex without intravenous contrast.  COMPARISON:  12/08/2011  FINDINGS: Subarachnoid blood is identified within the fourth ventricle and third ventricle. There is moderate blood within the basilar cisterns. Subarachnoid blood is identified in the posterior fossa, right greater than left. Subarachnoid versus subdural blood is identified along the tentorium. Subarachnoid blood is also identified in the foramen magnum. No evidence for midline shift or significant mass effect. No calvarial fracture.  IMPRESSION: Moderate subarachnoid hemorrhage, primarily involving the posterior fossa and basilar cisterns, right greater than left.  Critical Value/emergent results were called by telephone at the time of interpretation on 07/16/2014 at 8:08 pm to Dr. Margarita Grizzle , who verbally acknowledged these results.   Electronically Signed   By: Rosalie Gums M.D.   On: 07/16/2014 20:09    EKG: Not significantly changed from 01/24/2014. CXR: Personally reviewed. No acute process.  ASSESSMENT / PLAN:  Active Problems:   SAH (subarachnoid hemorrhage)   PULMONARY A: VDRF due to inability to protect airway ETT 9/26>>> P: Full vent support for now. F/U ABG and CXR in AM Hold weaning due to neuro status.  CARDIOVASCULAR A: HTN HL H/o MI: Negative Stress Test on 02/20/2013. Echo noted LVH on 12/09/2011. P:  Maintain SBP <= 180 with cardene drip  RENAL A: Hypokalemia:  P:   BMET in AM Replace electrolytes as indicated  GASTROINTESTINAL A: No acute process P: Consult nutrition for TF  HEMATOLOGIC A: Anemia: Likely AOCD. Near baseline.  INFECTIOUS A: No acute process  ENDOCRINE A: DM:  P:   SSI  NEUROLOGIC A: SAH:  Cocaine Abuse Schizophrneia/Bipolar:  P:   Management of SAH per NSG Will need to consult psychiatry when medically stable for medication assistance (reportably off meds  x 1 month)  TODAY'S SUMMARY: maintain intubated and vented today, hold PS trials until mental status is improved.  I have personally obtained a history, examined the patient, evaluated laboratory and imaging results, formulated the assessment and plan and placed orders.  CRITICAL CARE: The patient is critically ill with multiple organ systems failure and requires high complexity decision making for assessment and support, frequent evaluation and titration of therapies, application of advanced monitoring technologies and extensive interpretation of multiple databases. Critical Care Time devoted to patient care services described in this note is 35 minutes.   Alyson Reedy, M.D. [facility] Pulmonary/Critical Care Medicine. Pager: [phone]. After hours pager: [phone].  07/17/2014, 11:10 AM

## 2014-07-17 NOTE — Progress Notes (Signed)
eLink Physician-Brief Progress Note Patient Name: [name] DOB: 08/26/1956 MRN: [id number]   Date of Service  07/17/2014  HPI/Events of Note  Subarachnoid hemorrhage Headache pain Hyperglycemia  eICU Interventions  Nimodipine  q4h x21 days Fentanyl prn SSI q4h     Intervention Category Major Interventions: Hemorrhage - evaluation and management Intermediate Interventions: Pain - evaluation and management  MCQUAID, DOUGLAS 07/17/2014, 12:34 AM

## 2014-07-17 NOTE — Progress Notes (Signed)
[facility] ADULT ICU REPLACEMENT PROTOCOL FOR AM LAB REPLACEMENT ONLY  The patient does apply for the [facility] Adult ICU Electrolyte Replacment Protocol based on the criteria listed below:   1. Is GFR >/= 40 ml/min? Yes.    Patient's GFR today is >90 2. Is urine output >/= 0.5 ml/kg/hr for the last 6 hours? Yes.   Patient's UOP is 2.2 ml/kg/hr 3. Is BUN < 60 mg/dL? Yes.    Patient's BUN today is 13 4. Abnormal electrolyte(s): K 3.2, M 1.8 5. Ordered repletion with: per protocol 6. If a panic level lab has been reported, has the CCM MD in charge been notified? No..   Physician:    Markus Daft A 07/17/2014 4:52 AM

## 2014-07-17 NOTE — Progress Notes (Signed)
Patient ID: [name], male   DOB: 1956/01/12, 58 y.o.   MRN: [id number] Patient remains arousable. He will follow some simple commands His pupils are 3 mm equal and reactive Neck has 3+ meningismus CT scan yesterday demonstrates no change in degree of hydrocephalus Furthermore CT does not demonstrate any evidence of aneurysm in the posterior fossa Likely subarachnoid secondary to hypertensive episode Blood pressure now under control he is on Nimodipine orally. Risk of vasospasm in these patients is typically small Continue IV fluid support Observe hydrocephalus for now.

## 2014-07-18 ENCOUNTER — Inpatient Hospital Stay (HOSPITAL_COMMUNITY): Payer: Medicaid Other

## 2014-07-18 DIAGNOSIS — F1420 Cocaine dependence, uncomplicated: Secondary | ICD-10-CM

## 2014-07-18 DIAGNOSIS — F19988 Other psychoactive substance use, unspecified with other psychoactive substance-induced disorder: Secondary | ICD-10-CM

## 2014-07-18 DIAGNOSIS — F209 Schizophrenia, unspecified: Secondary | ICD-10-CM

## 2014-07-18 LAB — BASIC METABOLIC PANEL
ANION GAP: 14 (ref 5–15)
BUN: 14 mg/dL (ref 6–23)
CHLORIDE: 97 meq/L (ref 96–112)
CO2: 21 mEq/L (ref 19–32)
Calcium: 8.6 mg/dL (ref 8.4–10.5)
Creatinine, Ser: 0.85 mg/dL (ref 0.50–1.35)
Glucose, Bld: 441 mg/dL — ABNORMAL HIGH (ref 70–99)
POTASSIUM: 4.1 meq/L (ref 3.7–5.3)
Sodium: 132 mEq/L — ABNORMAL LOW (ref 137–147)

## 2014-07-18 LAB — BLOOD GAS, ARTERIAL
Acid-base deficit: 1.7 mmol/L (ref 0.0–2.0)
Bicarbonate: 21.9 mEq/L (ref 20.0–24.0)
DRAWN BY: 31101
FIO2: 0.21 %
O2 SAT: 96.5 %
Patient temperature: 98.6
TCO2: 22.9 mmol/L (ref 0–100)
pCO2 arterial: 32.6 mmHg — ABNORMAL LOW (ref 35.0–45.0)
pH, Arterial: 7.441 (ref 7.350–7.450)
pO2, Arterial: 83.5 mmHg (ref 80.0–100.0)

## 2014-07-18 LAB — GLUCOSE, CAPILLARY
GLUCOSE-CAPILLARY: 458 mg/dL — AB (ref 70–99)
Glucose-Capillary: 373 mg/dL — ABNORMAL HIGH (ref 70–99)
Glucose-Capillary: 203 mg/dL — ABNORMAL HIGH (ref 70–99)
Glucose-Capillary: 222 mg/dL — ABNORMAL HIGH (ref 70–99)

## 2014-07-18 LAB — CBC
HCT: 37.1 % — ABNORMAL LOW (ref 39.0–52.0)
HEMOGLOBIN: 12.9 g/dL — AB (ref 13.0–17.0)
MCH: 29.3 pg (ref 26.0–34.0)
MCHC: 34.8 g/dL (ref 30.0–36.0)
MCV: 84.3 fL (ref 78.0–100.0)
Platelets: 238 10*3/uL (ref 150–400)
RBC: 4.4 MIL/uL (ref 4.22–5.81)
RDW: 13.1 % (ref 11.5–15.5)
WBC: 8.3 10*3/uL (ref 4.0–10.5)

## 2014-07-18 LAB — PHOSPHORUS: PHOSPHORUS: 3.5 mg/dL (ref 2.3–4.6)

## 2014-07-18 LAB — MAGNESIUM: Magnesium: 1.9 mg/dL (ref 1.5–2.5)

## 2014-07-18 MED ORDER — PANTOPRAZOLE SODIUM 40 MG PO TBEC
40.0000 mg | DELAYED_RELEASE_TABLET | Freq: Every day | ORAL | Status: DC
  Administered 2014-07-18 – 2014-08-01 (×15): 40 mg via ORAL
  Filled 2014-07-18 (×15): qty 1

## 2014-07-18 MED ORDER — INSULIN ASPART 100 UNIT/ML ~~LOC~~ SOLN
0.0000 [IU] | Freq: Three times a day (TID) | SUBCUTANEOUS | Status: DC
  Administered 2014-07-19: 11 [IU] via SUBCUTANEOUS
  Administered 2014-07-19 (×2): 7 [IU] via SUBCUTANEOUS
  Administered 2014-07-20 (×2): 11 [IU] via SUBCUTANEOUS
  Administered 2014-07-21: 3 [IU] via SUBCUTANEOUS
  Administered 2014-07-21: 7 [IU] via SUBCUTANEOUS
  Administered 2014-07-21 – 2014-07-22 (×3): 4 [IU] via SUBCUTANEOUS
  Administered 2014-07-23: 3 [IU] via SUBCUTANEOUS
  Administered 2014-07-23: 7 [IU] via SUBCUTANEOUS
  Administered 2014-07-24: 3 [IU] via SUBCUTANEOUS
  Administered 2014-07-24: 4 [IU] via SUBCUTANEOUS
  Administered 2014-07-25 – 2014-07-26 (×2): 7 [IU] via SUBCUTANEOUS
  Administered 2014-07-27: 4 [IU] via SUBCUTANEOUS
  Administered 2014-07-27: 20 [IU] via SUBCUTANEOUS
  Administered 2014-07-27: 7 [IU] via SUBCUTANEOUS
  Administered 2014-07-28: 15 [IU] via SUBCUTANEOUS
  Administered 2014-07-28: 7 [IU] via SUBCUTANEOUS
  Administered 2014-07-28: 4 [IU] via SUBCUTANEOUS
  Administered 2014-07-29: 3 [IU] via SUBCUTANEOUS
  Administered 2014-07-29: 7 [IU] via SUBCUTANEOUS
  Administered 2014-07-31: 3 [IU] via SUBCUTANEOUS
  Administered 2014-08-01: 11 [IU] via SUBCUTANEOUS

## 2014-07-18 MED ORDER — INSULIN DETEMIR 100 UNIT/ML ~~LOC~~ SOLN
25.0000 [IU] | Freq: Every day | SUBCUTANEOUS | Status: DC
  Administered 2014-07-18 – 2014-07-19 (×2): 25 [IU] via SUBCUTANEOUS
  Filled 2014-07-18 (×3): qty 0.25

## 2014-07-18 MED ORDER — INSULIN ASPART 100 UNIT/ML ~~LOC~~ SOLN
6.0000 [IU] | Freq: Three times a day (TID) | SUBCUTANEOUS | Status: DC
  Administered 2014-07-19 – 2014-07-21 (×6): 6 [IU] via SUBCUTANEOUS

## 2014-07-18 MED ORDER — INSULIN ASPART 100 UNIT/ML ~~LOC~~ SOLN
0.0000 [IU] | Freq: Every day | SUBCUTANEOUS | Status: DC
  Administered 2014-07-18 – 2014-07-20 (×2): 2 [IU] via SUBCUTANEOUS
  Administered 2014-07-26: 3 [IU] via SUBCUTANEOUS
  Administered 2014-07-27: 4 [IU] via SUBCUTANEOUS
  Administered 2014-08-01: 2 [IU] via SUBCUTANEOUS

## 2014-07-18 MED ORDER — MAGNESIUM SULFATE IN D5W 10-5 MG/ML-% IV SOLN
1.0000 g | Freq: Once | INTRAVENOUS | Status: AC
  Administered 2014-07-18: 1 g via INTRAVENOUS
  Filled 2014-07-18: qty 100

## 2014-07-18 NOTE — Progress Notes (Signed)
Patient ID: [name], male   DOB: [birth year], 58 y.o.   MRN: [id number] Alert oriented, complains of pain in base of neck, complains of headache  Neurologically improving Patient may be mobilized.
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
## 2014-07-18 NOTE — Progress Notes (Addendum)
Inpatient Diabetes Program Recommendations  AACE/ADA: New Consensus Statement on Inpatient Glycemic Control (2013)  Target Ranges:  Prepandial:   less than 140 mg/dL      Peak postprandial:   less than 180 mg/dL (1-2 hours)      Critically ill patients:  140 - 180 mg/dL   Reason for Visit: Hyperglycemia Diabetes history: DM2 Outpatient Diabetes medications: Reportedly off meds x 1 month Current orders for Inpatient glycemic control: Novolog moderate tidwc  Results for SRIMAN, TALLY (MRN [id number]) as of 07/18/2014 08:46  Ref. Range 07/17/2014 02:56 07/18/2014 02:50  Sodium Latest Range: 137-147 mEq/L 139 132 (L)  Potassium Latest Range: 3.7-5.3 mEq/L 3.2 (L) 4.1  Chloride Latest Range: 96-112 mEq/L 99 97  CO2 Latest Range: 19-32 mEq/L 24 21  BUN Latest Range: 6-23 mg/dL 13 14  Creatinine Latest Range: 0.50-1.35 mg/dL 4.09 8.11  Calcium Latest Range: 8.4-10.5 mg/dL 9.0 8.6  GFR calc non Af Amer Latest Range: >90 mL/min >90 >90  GFR calc Af Amer Latest Range: >90 mL/min >90 >90  Glucose Latest Range: 70-99 mg/dL 914 (H) 782 (H)  Anion gap Latest Range: 5-15  16 (H) 14   Results for IVO, MOGA (MRN [id number]) as of 07/18/2014 08:46  Ref. Range 07/17/2014 02:56 07/18/2014 02:50  Glucose Latest Range: 70-99 mg/dL 578 (H) 469 (H)   Hyperglycemia - needs basal insulin.  Recommendations: Please consider the ICU Hyperglycemia Protocol. Novolog Q4H since NPO Need updated HgbA1C to assess glycemic control prior to hospitalization.    Will continue to follow. Thank you. Ailene Ards, RD, LDN, CDE Inpatient Diabetes Coordinator [phone]

## 2014-07-18 NOTE — Progress Notes (Signed)
eLink Physician-Brief Progress Note Patient Name: LUCIAN BASWELL DOB: 1955-11-28 MRN: [id number]   Date of Service  07/18/2014  HPI/Events of Note    eICU Interventions  Insulin regimen adjusted based on the DM Coordinator's recs > levemir + SSI + meal coverage 6u     Intervention Category Intermediate Interventions: Hyperglycemia - evaluation and treatment  Anapaula Severt S. 07/18/2014, 6:16 PM

## 2014-07-18 NOTE — Progress Notes (Signed)
Inpatient Diabetes Program Recommendations  AACE/ADA: New Consensus Statement on Inpatient Glycemic Control (2013)  Target Ranges:  Prepandial:   less than 140 mg/dL      Peak postprandial:   less than 180 mg/dL (1-2 hours)      Critically ill patients:  140 - 180 mg/dL    Results for DEMTRIUS, ROUNDS (MRN [id number]) as of 07/18/2014 13:25  Ref. Range 07/17/2014 02:56 07/18/2014 02:50  Sodium Latest Range: 137-147 mEq/L 139 132 (L)  Potassium Latest Range: 3.7-5.3 mEq/L 3.2 (L) 4.1  Chloride Latest Range: 96-112 mEq/L 99 97  CO2 Latest Range: 19-32 mEq/L 24 21  BUN Latest Range: 6-23 mg/dL 13 14  Creatinine Latest Range: 0.50-1.35 mg/dL 4.09 8.11  Calcium Latest Range: 8.4-10.5 mg/dL 9.0 8.6  GFR calc non Af Amer Latest Range: >90 mL/min >90 >90  GFR calc Af Amer Latest Range: >90 mL/min >90 >90  Glucose Latest Range: 70-99 mg/dL 914 (H) 782 (H)  Anion gap Latest Range: 5-15  16 (H) 14     Results for CONAN, MCMANAWAY (MRN [id number]) as of 07/18/2014 13:25  Ref. Range 07/17/2014 17:48 07/18/2014 07:54 07/18/2014 12:51  Glucose-Capillary Latest Range: 70-99 mg/dL 578 (H) 469 (H) 629 (H)   Eating well. CHO mod diet started on 9/27 dinner meal. Needs basal insulin with meal coverage.  Recommendations: Add Levemir 25 units Q24H - to start now. Increase Novolog to resistant tidwc and hs. Novolog 6 units tidwc for meal coverage insulin if pt eats >50% meal and CBG > 80 mg/dL Titrate both basal and bolus insulin daily to goal of CBG < 180 mg/dL.  Will follow daily. Thank you. Ailene Ards, RD, LDN, CDE Inpatient Diabetes Coordinator [phone]

## 2014-07-19 LAB — GLUCOSE, CAPILLARY
GLUCOSE-CAPILLARY: 228 mg/dL — AB (ref 70–99)
Glucose-Capillary: 206 mg/dL — ABNORMAL HIGH (ref 70–99)
Glucose-Capillary: 260 mg/dL — ABNORMAL HIGH (ref 70–99)
Glucose-Capillary: 178 mg/dL — ABNORMAL HIGH (ref 70–99)

## 2014-07-19 MED ORDER — METOCLOPRAMIDE HCL 5 MG/ML IJ SOLN
5.0000 mg | Freq: Three times a day (TID) | INTRAMUSCULAR | Status: DC | PRN
  Administered 2014-07-20 – 2014-07-25 (×5): 5 mg via INTRAVENOUS
  Filled 2014-07-19: qty 1
  Filled 2014-07-19 (×5): qty 2

## 2014-07-19 MED ORDER — MAGNESIUM SULFATE 40 MG/ML IJ SOLN
2.0000 g | Freq: Once | INTRAMUSCULAR | Status: AC
  Administered 2014-07-19: 2 g via INTRAVENOUS
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
## 2014-07-19 NOTE — Progress Notes (Signed)
Patient ID: Justin KeensLarry B Delacruz, male   DOB: 10/20/1956, 58 y.o.   MRN: [id number] Vital signs are stable. Patient starting to ambulate. If he remains stable, he may be able to be discharged home soon.

## 2014-07-19 NOTE — Progress Notes (Signed)
Per CSW, a TB screening and psych consult with med assistance are needed in order for them to find placement for this pt.  [facility] paged, they will pick up this pt at 0700 tomorrow morning.
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
## 2014-07-20 ENCOUNTER — Inpatient Hospital Stay (HOSPITAL_COMMUNITY): Payer: Medicaid Other

## 2014-07-20 DIAGNOSIS — F1410 Cocaine abuse, uncomplicated: Secondary | ICD-10-CM

## 2014-07-20 DIAGNOSIS — F1994 Other psychoactive substance use, unspecified with psychoactive substance-induced mood disorder: Secondary | ICD-10-CM

## 2014-07-20 LAB — GLUCOSE, CAPILLARY
Glucose-Capillary: 257 mg/dL — ABNORMAL HIGH (ref 70–99)
Glucose-Capillary: 267 mg/dL — ABNORMAL HIGH (ref 70–99)
Glucose-Capillary: 360 mg/dL — ABNORMAL HIGH (ref 70–99)
Glucose-Capillary: 214 mg/dL — ABNORMAL HIGH (ref 70–99)

## 2014-07-20 LAB — TROPONIN I: Troponin I: <0.3 ng/mL (ref <0.30)

## 2014-07-20 MED ORDER — LABETALOL HCL 5 MG/ML IV SOLN
10.0000 mg | Freq: Four times a day (QID) | INTRAVENOUS | Status: DC | PRN
  Administered 2014-07-20: 10 mg via INTRAVENOUS
  Filled 2014-07-20: qty 4

## 2014-07-20 MED ORDER — TRAZODONE HCL 50 MG PO TABS
25.0000 mg | ORAL_TABLET | Freq: Every day | ORAL | Status: DC
  Administered 2014-07-20: 25 mg via ORAL
  Filled 2014-07-20: qty 1

## 2014-07-20 MED ORDER — TUBERCULIN PPD 5 UNIT/0.1ML ID SOLN
5.0000 [IU] | Freq: Once | INTRADERMAL | Status: DC
  Filled 2014-07-20: qty 0.1

## 2014-07-20 MED ORDER — SERTRALINE HCL 50 MG PO TABS
25.0000 mg | ORAL_TABLET | Freq: Every day | ORAL | Status: DC
  Administered 2014-07-20 – 2014-07-22 (×3): 25 mg via ORAL
  Filled 2014-07-20 (×3): qty 1

## 2014-07-20 MED ORDER — HYDRALAZINE HCL 50 MG PO TABS: 50.0000 mg | ORAL_TABLET | Freq: Three times a day (TID) | ORAL | Status: DC

## 2014-07-20 MED ORDER — INSULIN ASPART PROT & ASPART (70-30 MIX) 100 UNIT/ML ~~LOC~~ SUSP
30.0000 [IU] | Freq: Two times a day (BID) | SUBCUTANEOUS | Status: DC
  Administered 2014-07-20 – 2014-07-21 (×2): 30 [IU] via SUBCUTANEOUS
  Filled 2014-07-20: qty 10

## 2014-07-20 MED ORDER — AMLODIPINE BESYLATE 10 MG PO TABS
10.0000 mg | ORAL_TABLET | Freq: Every day | ORAL | Status: DC
  Administered 2014-07-20 – 2014-08-02 (×14): 10 mg via ORAL
  Filled 2014-07-20 (×14): qty 1

## 2014-07-20 NOTE — Progress Notes (Addendum)
Inpatient Diabetes Program Recommendations  AACE/ADA: New Consensus Statement on Inpatient Glycemic Control (2013)  Target Ranges:  Prepandial:   less than 140 mg/dL      Peak postprandial:   less than 180 mg/dL (1-2 hours)      Critically ill patients:  140 - 180 mg/dL   Inpatient Diabetes Program Recommendations Insulin - Basal: Presently pt on Levemir and novolog mc and correction. Pt has been on 70/30 in the past and potentially may be discharged on 70/30 again as this may be a simpler regimen for discharge. Recommend starting with 30 units 70/30 in the am and ac supper Correction (SSI): continue as ordered Insulin - Meal Coverage: discontinue meal coverage if using 70/30 Noted d/c plan for living accomodations.    Thank you, Lenor CoffinAnn Ramelo Oetken, RN, CNS, Diabetes Coordinator [phone])

## 2014-07-20 NOTE — Progress Notes (Signed)
UR completed 

## 2014-07-20 NOTE — Progress Notes (Signed)
Patient ID: Justin KeensLarry B Creely, male   DOB: 03/20/1956, 58 y.o.   MRN: [id number] Alert somewhat agitated. Complains of chest pain Also complains of neck and shoulder pain Continues conservative care for posterior fossa subarachnoid hemorrhage likely related to hypertension.
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
## 2014-07-20 NOTE — Clinical Social Work Note (Signed)
Clinical Social Worker continuing to follow patient and family for support and discharge planning needs.  CSW spoke with patient again at bedside today, and he is still in agreement with providing his check and going [facility] [place].  Patient requested information about discharge be shared with his mother - CSW contacted and provided update over the phone.  Patient was seen by Psychiatry and started on a medication regimen.  CSW has [facility] [place] - facility requested follow up phone call on day of discharge.  CSW has left message with Oneal GroutBeverly [facility] [phone]) regarding possible admission tomorrow pending patient medical stability.  CSW remains available for support and to facilitate patient discharge needs once medically ready.  Macario GoldsJesse Ash [place], [place] [phone]

## 2014-07-21 ENCOUNTER — Inpatient Hospital Stay (HOSPITAL_COMMUNITY): Payer: Medicaid Other

## 2014-07-21 DIAGNOSIS — I609 Nontraumatic subarachnoid hemorrhage, unspecified: Principal | ICD-10-CM

## 2014-07-21 LAB — URINALYSIS, ROUTINE W REFLEX MICROSCOPIC
BILIRUBIN URINE: NEGATIVE
Glucose, UA: 500 mg/dL — AB
Ketones, ur: 15 mg/dL — AB
Leukocytes, UA: NEGATIVE
Nitrite: NEGATIVE
PH: 5.5 (ref 5.0–8.0)
Protein, ur: >300 mg/dL — AB
Specific Gravity, Urine: 1.027 (ref 1.005–1.030)
Urobilinogen, UA: 1 mg/dL (ref 0.0–1.0)

## 2014-07-21 LAB — COMPREHENSIVE METABOLIC PANEL
ALK PHOS: 87 U/L (ref 39–117)
ALT: 7 U/L (ref 0–53)
AST: 10 U/L (ref 0–37)
Albumin: 2.8 g/dL — ABNORMAL LOW (ref 3.5–5.2)
Anion gap: 14 (ref 5–15)
BUN: 15 mg/dL (ref 6–23)
CO2: 24 mEq/L (ref 19–32)
CREATININE: 0.77 mg/dL (ref 0.50–1.35)
Calcium: 8.9 mg/dL (ref 8.4–10.5)
Chloride: 92 mEq/L — ABNORMAL LOW (ref 96–112)
GFR calc non Af Amer: >90 mL/min (ref >90)
GLUCOSE: 165 mg/dL — AB (ref 70–99)
POTASSIUM: 3.9 meq/L (ref 3.7–5.3)
Sodium: 130 mEq/L — ABNORMAL LOW (ref 137–147)
TOTAL PROTEIN: 7.8 g/dL (ref 6.0–8.3)
Total Bilirubin: 0.5 mg/dL (ref 0.3–1.2)

## 2014-07-21 LAB — GLUCOSE, CAPILLARY
GLUCOSE-CAPILLARY: 161 mg/dL — AB (ref 70–99)
GLUCOSE-CAPILLARY: 128 mg/dL — AB (ref 70–99)
GLUCOSE-CAPILLARY: 90 mg/dL (ref 70–99)
Glucose-Capillary: 237 mg/dL — ABNORMAL HIGH (ref 70–99)

## 2014-07-21 LAB — CBC
HCT: 38.3 % — ABNORMAL LOW (ref 39.0–52.0)
Hemoglobin: 13.6 g/dL (ref 13.0–17.0)
MCH: 28.8 pg (ref 26.0–34.0)
MCHC: 35.5 g/dL (ref 30.0–36.0)
MCV: 81 fL (ref 78.0–100.0)
Platelets: 314 10*3/uL (ref 150–400)
RBC: 4.73 MIL/uL (ref 4.22–5.81)
RDW: 12.6 % (ref 11.5–15.5)
WBC: 7.3 10*3/uL (ref 4.0–10.5)

## 2014-07-21 LAB — URINE MICROSCOPIC-ADD ON

## 2014-07-21 LAB — TROPONIN I: Troponin I: <0.3 ng/mL (ref <0.30)

## 2014-07-21 MED ORDER — TRAZODONE HCL 50 MG PO TABS: 25.0000 mg | ORAL_TABLET | Freq: Every day | ORAL | Status: DC

## 2014-07-21 MED ORDER — HYDRALAZINE HCL 50 MG PO TABS
50.0000 mg | ORAL_TABLET | Freq: Three times a day (TID) | ORAL | Status: DC
  Administered 2014-07-21 – 2014-08-02 (×35): 50 mg via ORAL
  Filled 2014-07-21 (×35): qty 1

## 2014-07-21 MED ORDER — ACETAMINOPHEN 325 MG PO TABS
650.0000 mg | ORAL_TABLET | Freq: Four times a day (QID) | ORAL | Status: DC | PRN
  Administered 2014-07-21 – 2014-07-31 (×14): 650 mg via ORAL
  Filled 2014-07-21 (×14): qty 2

## 2014-07-21 MED ORDER — SODIUM CHLORIDE 1 G PO TABS
1.0000 g | ORAL_TABLET | Freq: Three times a day (TID) | ORAL | Status: DC
  Administered 2014-07-21 – 2014-07-22 (×3): 1 g via ORAL
  Filled 2014-07-21 (×6): qty 1

## 2014-07-21 MED ORDER — INSULIN ASPART PROT & ASPART (70-30 MIX) 100 UNIT/ML ~~LOC~~ SUSP
35.0000 [IU] | Freq: Two times a day (BID) | SUBCUTANEOUS | Status: DC
Start: 2014-07-21 — End: 2014-07-26
  Administered 2014-07-21 – 2014-07-26 (×8): 35 [IU] via SUBCUTANEOUS
  Filled 2014-07-21 (×2): qty 10

## 2014-07-21 MED ORDER — SODIUM CHLORIDE 0.9 % IV SOLN
250.0000 mL | INTRAVENOUS | Status: DC | PRN
  Administered 2014-07-21: 250 mL via INTRAVENOUS

## 2014-07-21 MED ORDER — ZIPRASIDONE HCL 20 MG PO CAPS
20.0000 mg | ORAL_CAPSULE | Freq: Every day | ORAL | Status: DC
  Filled 2014-07-21 (×2): qty 1

## 2014-07-21 MED ORDER — SERTRALINE HCL 50 MG PO TABS: 50.0000 mg | ORAL_TABLET | Freq: Every day | ORAL | Status: DC
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
## 2014-07-21 NOTE — Progress Notes (Signed)
Patient ID: Justin KeensLarry B Delacruz, male   DOB: 05/31/1956, 58 y.o.   MRN: [id number] Day 5 status post subarachnoid hemorrhage Hyponatremia noted Off and concerning for possible vasospasm Continue supportive care Add oral sodium chloride to help bolster his sodium level

## 2014-07-21 NOTE — Progress Notes (Signed)
Inpatient Diabetes Program Recommendations  AACE/ADA: New Consensus Statement on Inpatient Glycemic Control (2013)  Target Ranges:  Prepandial:   less than 140 mg/dL      Peak postprandial:   less than 180 mg/dL (1-2 hours)      Critically ill patients:  140 - 180 mg/dL  Results for Coralie KeensSTACEY, Rustyn B (MRN [id number]) as of 07/21/2014 10:24  Ref. Range 07/20/2014 06:38 07/20/2014 11:42 07/20/2014 16:45 07/20/2014 21:18 07/21/2014 06:44  Glucose-Capillary Latest Range: 70-99 mg/dL 409257 (H) 811267 (H) 914360 (H) 214 (H) 237 (H)   Reason for Visit: elevated CBG  Diabetes history: Type 2 Outpatient Diabetes medications: 70/30 50 units bid, 2-15 units Novulin R   Current orders for Inpatient glycemic control:  70/30 Novolog mix 35 units bid to begin this evening,  Continue Novolog correction at meals and at hs as ordered and Novolog 6 units with meals  Please discontinue meal coverage once the patient begins using 70/30 tonight.    Susette RacerJulie Adair Lemar, RN, BA, MHA, CDE Diabetes Coordinator Inpatient Diabetes Program  [phone] (Team Pager) [phone] Patrcia Dolly([place] Office) 07/21/2014 10:33 AM

## 2014-07-21 NOTE — Clinical Social Work Note (Signed)
Clinical Social Worker continuing to follow patient and family for support and discharge planning needs.  [facility]  CSW contacted Oneal GroutBeverly Rucker who states that she will provide transportation to patient tomorrow at discharge.  Patient will need FL2 with medications and discharge summary upon discharge.  CSW spoke with Albin Fellingarla and [facility] Monday at 14:30.  CSW to notify Ms. Rucker when patient is medically cleared for discharge.  CSW remains available for support and to facilitate patient discharge needs.  Macario GoldsJesse Garrison [place], [place] [phone]

## 2014-07-22 DIAGNOSIS — F1994 Other psychoactive substance use, unspecified with psychoactive substance-induced mood disorder: Secondary | ICD-10-CM

## 2014-07-22 DIAGNOSIS — F1410 Cocaine abuse, uncomplicated: Secondary | ICD-10-CM

## 2014-07-22 DIAGNOSIS — I059 Rheumatic mitral valve disease, unspecified: Secondary | ICD-10-CM

## 2014-07-22 LAB — GLUCOSE, CAPILLARY
GLUCOSE-CAPILLARY: 199 mg/dL — AB (ref 70–99)
GLUCOSE-CAPILLARY: 175 mg/dL — AB (ref 70–99)
Glucose-Capillary: 140 mg/dL — ABNORMAL HIGH (ref 70–99)
Glucose-Capillary: 124 mg/dL — ABNORMAL HIGH (ref 70–99)

## 2014-07-22 LAB — OSMOLALITY, URINE: Osmolality, Ur: 663 mOsm/kg (ref 390–1090)

## 2014-07-22 LAB — COMPREHENSIVE METABOLIC PANEL
ALT: 12 U/L (ref 0–53)
AST: 16 U/L (ref 0–37)
Albumin: 2.8 g/dL — ABNORMAL LOW (ref 3.5–5.2)
Alkaline Phosphatase: 86 U/L (ref 39–117)
Anion gap: 13 (ref 5–15)
BUN: 16 mg/dL (ref 6–23)
CALCIUM: 8.6 mg/dL (ref 8.4–10.5)
CO2: 24 meq/L (ref 19–32)
Chloride: 86 mEq/L — ABNORMAL LOW (ref 96–112)
Creatinine, Ser: 0.93 mg/dL (ref 0.50–1.35)
GLUCOSE: 111 mg/dL — AB (ref 70–99)
Potassium: 4 mEq/L (ref 3.7–5.3)
SODIUM: 123 meq/L — AB (ref 137–147)
TOTAL PROTEIN: 7.5 g/dL (ref 6.0–8.3)
Total Bilirubin: 0.6 mg/dL (ref 0.3–1.2)

## 2014-07-22 LAB — BASIC METABOLIC PANEL
ANION GAP: 15 (ref 5–15)
BUN: 17 mg/dL (ref 6–23)
CALCIUM: 8.4 mg/dL (ref 8.4–10.5)
CO2: 21 mEq/L (ref 19–32)
CREATININE: 0.86 mg/dL (ref 0.50–1.35)
Chloride: 87 mEq/L — ABNORMAL LOW (ref 96–112)
Glucose, Bld: 146 mg/dL — ABNORMAL HIGH (ref 70–99)
Potassium: 4.1 mEq/L (ref 3.7–5.3)
Sodium: 123 mEq/L — ABNORMAL LOW (ref 137–147)

## 2014-07-22 LAB — CBC
HCT: 38.3 % — ABNORMAL LOW (ref 39.0–52.0)
HEMOGLOBIN: 13.3 g/dL (ref 13.0–17.0)
MCH: 28.9 pg (ref 26.0–34.0)
MCHC: 34.7 g/dL (ref 30.0–36.0)
MCV: 83.1 fL (ref 78.0–100.0)
PLATELETS: 336 10*3/uL (ref 150–400)
RBC: 4.61 MIL/uL (ref 4.22–5.81)
RDW: 12.8 % (ref 11.5–15.5)
WBC: 8.3 10*3/uL (ref 4.0–10.5)

## 2014-07-22 LAB — OSMOLALITY: Osmolality: 262 mOsm/kg — ABNORMAL LOW (ref 275–300)

## 2014-07-22 MED ORDER — SODIUM CHLORIDE 0.9 % IV SOLN: 250.0000 mL | INTRAVENOUS | Status: DC | PRN

## 2014-07-22 MED ORDER — SERTRALINE HCL 50 MG PO TABS: 50.0000 mg | ORAL_TABLET | Freq: Every day | ORAL | Status: DC

## 2014-07-22 MED ORDER — KETOROLAC TROMETHAMINE 30 MG/ML IJ SOLN
30.0000 mg | Freq: Four times a day (QID) | INTRAMUSCULAR | Status: AC | PRN
  Administered 2014-07-22 – 2014-07-27 (×7): 30 mg via INTRAVENOUS
  Filled 2014-07-22 (×7): qty 1

## 2014-07-22 MED ORDER — SODIUM CHLORIDE 1 G PO TABS
2.0000 g | ORAL_TABLET | Freq: Every day | ORAL | Status: DC
  Administered 2014-07-22 – 2014-07-25 (×16): 2 g via ORAL
  Filled 2014-07-22 (×30): qty 2

## 2014-07-22 MED ORDER — TRAZODONE HCL 50 MG PO TABS: 50.0000 mg | ORAL_TABLET | Freq: Every day | ORAL | Status: DC

## 2014-07-22 MED ORDER — FUROSEMIDE 10 MG/ML IJ SOLN
40.0000 mg | Freq: Every day | INTRAMUSCULAR | Status: DC
  Administered 2014-07-22 – 2014-07-26 (×5): 40 mg via INTRAVENOUS
  Filled 2014-07-22 (×5): qty 4

## 2014-07-22 NOTE — Progress Notes (Addendum)
Pt had episode of vomitus, neuro still intact, pupils 4 equal and reactive, no new sensory or motor deficits.  No change in level of pain in his head.  MD paged.  BMP ordered for 1500.  Will monitor.
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
## 2014-07-22 NOTE — Progress Notes (Signed)
[facility] MD Progress Note  07/22/2014 10:04 AM [name]  MRN:  [id number] Subjective:  Patient is appeared lying on his bed and his brother is at bed side stated that he continues to feel depressed, worried, auditory hallucinations, isolated, lack of energy, dizziness, decreased appetite and sleep. He states that he slept only two hours last night and five hours in the last two days. He is compliant with his medications, reportedly tolerating and has no side effects. His auditory hallucination are lessened and not bothering him any more and wants to continue his Geodon. He has positive attitude to get better and wants to adjust his medication for better control of his depression and insomnia. He was told his electrolytes especially sodium has been low and working to get better. He appreciate the care he is getting in hospital. He has regrets for making bad choices of using recreational drug prior to be admitted over seven months. Patient has Subarachnoid hemorrhage and neurosurgical following up with him and monitoring his condition. His brother is supportive to him and reportedly he has some family in [place], [place]  Diagnosis:   DSM5: Schizophrenia Disorders:   Obsessive-Compulsive Disorders:   Trauma-Stressor Disorders:   Substance/Addictive Disorders:   Depressive Disorders:  Major Depressive Disorder - Unspecified (296.20) Total Time spent with patient: 30 minutes  Axis I: Substance Abuse, Substance Induced Mood Disorder and Cocaine abuse and intoxication  ADL's:  Impaired  Sleep: Fair  Appetite:  Good  Suicidal Ideation:  Patient denied suicidal ideation, intention or plans Homicidal Ideation:  denied AEB (as evidenced by):  Psychiatric Specialty Exam: Physical Exam  ROS  Blood pressure 118/62, pulse 96, temperature 98.6 F (37 C), temperature source Oral, resp. rate 26, height _0  (1.702 m), weight 70 kg (154 lb 5.2 oz), SpO2 99.00%.Body mass index is 24.16 kg/(m^2).   General Appearance: Disheveled and Guarded  Eye Contact::  Good  Speech:  Clear and Coherent and Slow  Volume:  Decreased  Mood:  Depressed and Worthless  Affect:  Constricted and Depressed  Thought Process:  Coherent and Goal Directed  Orientation:  Full (Time, Place, and Person)  Thought Content:  WDL  Suicidal Thoughts:  No  Homicidal Thoughts:  No  Memory:  Immediate;   Fair Recent;   Fair  Judgement:  Impaired  Insight:  Fair  Psychomotor Activity:  Decreased  Concentration:  Fair  Recall:  [employer] of Knowledge:Good  Language: Good  Akathisia:  NA  Handed:  Right  AIMS (if indicated):     Assets:  Communication Skills Desire for Improvement Leisure Time Resilience  Sleep:      Musculoskeletal: Strength & Muscle Tone: decreased Gait & Station: unable to stand Patient leans: N/A  Current Medications: Current Facility-Administered Medications  Medication Dose Route Frequency Provider Last Rate Last Dose  . 0.9 %  sodium chloride infusion  250 mL Intravenous PRN Reyne Dumas, MD 75 mL/hr at 07/21/14 1032 250 mL at 07/21/14 1032  . acetaminophen (TYLENOL) tablet 650 mg  650 mg Oral Q6H PRN Reyne Dumas, MD   650 mg at 07/22/14 0554  . amLODipine (NORVASC) tablet 10 mg  10 mg Oral Daily Reyne Dumas, MD   10 mg at 07/21/14 1031  . fentaNYL (SUBLIMAZE) injection 25-100 mcg  25-100 mcg Intravenous Q2H PRN Juanito Doom, MD   100 mcg at 07/22/14 0306  . hydrALAZINE (APRESOLINE) tablet 50 mg  50 mg Oral 3 times per day Reyne Dumas, MD  50 mg at 07/22/14 0557  . insulin aspart (novoLOG) injection 0-20 Units  0-20 Units Subcutaneous TID WC Collene Gobble, MD   3 Units at 07/21/14 1739  . insulin aspart (novoLOG) injection 0-5 Units  0-5 Units Subcutaneous QHS Collene Gobble, MD   2 Units at 07/20/14 2221  . insulin aspart protamine- aspart (NOVOLOG MIX 70/30) injection 35 Units  35 Units Subcutaneous BID WC Reyne Dumas, MD   35 Units at 07/21/14 1737  . labetalol  (NORMODYNE,TRANDATE) injection 10 mg  10 mg Intravenous QID PRN Reyne Dumas, MD   10 mg at 07/20/14 1231  . metoCLOPramide (REGLAN) injection 5 mg  5 mg Intravenous Q8H PRN Brand Males, MD   5 mg at 07/20/14 4401  . pantoprazole (PROTONIX) EC tablet 40 mg  40 mg Oral QHS Eudelia Bunch, RPH   40 mg at 07/21/14 2153  . sertraline (ZOLOFT) tablet 25 mg  25 mg Oral Daily Durward Parcel, MD   25 mg at 07/21/14 1031  . sodium chloride tablet 2 g  2 g Oral 6 X Daily Reyne Dumas, MD      . tuberculin injection 5 Units  5 Units Intradermal Once Reyne Dumas, MD      . ziprasidone (GEODON) capsule 20 mg  20 mg Oral QHS Reyne Dumas, MD        Lab Results:  Results for orders placed during the hospital encounter of 07/16/14 (from the past 48 hour(s))  TROPONIN I     Status: None   Collection Time    07/20/14 11:38 AM      Result Value Ref Range   Troponin I <0.30  <0.30 ng/mL   Comment:            Due to the release kinetics of cTnI,     a negative result within the first hours     of the onset of symptoms does not rule out     myocardial infarction with certainty.     If myocardial infarction is still suspected,     repeat the test at appropriate intervals.  GLUCOSE, CAPILLARY     Status: Abnormal   Collection Time    07/20/14 11:42 AM      Result Value Ref Range   Glucose-Capillary 267 (*) 70 - 99 mg/dL   Comment 1 Notify RN     Comment 2 Documented in Chart    GLUCOSE, CAPILLARY     Status: Abnormal   Collection Time    07/20/14  4:45 PM      Result Value Ref Range   Glucose-Capillary 360 (*) 70 - 99 mg/dL  TROPONIN I     Status: None   Collection Time    07/20/14  7:07 PM      Result Value Ref Range   Troponin I <0.30  <0.30 ng/mL   Comment:            Due to the release kinetics of cTnI,     a negative result within the first hours     of the onset of symptoms does not rule out     myocardial infarction with certainty.     If myocardial infarction is still  suspected,     repeat the test at appropriate intervals.  GLUCOSE, CAPILLARY     Status: Abnormal   Collection Time    07/20/14  9:18 PM      Result Value Ref Range   Glucose-Capillary 214 (*)  70 - 99 mg/dL   Comment 1 Documented in Chart     Comment 2 Notify RN    COMPREHENSIVE METABOLIC PANEL     Status: Abnormal   Collection Time    07/21/14  1:22 AM      Result Value Ref Range   Sodium 130 (*) 137 - 147 mEq/L   Potassium 3.9  3.7 - 5.3 mEq/L   Chloride 92 (*) 96 - 112 mEq/L   CO2 24  19 - 32 mEq/L   Glucose, Bld 165 (*) 70 - 99 mg/dL   BUN 15  6 - 23 mg/dL   Creatinine, Ser 0.77  0.50 - 1.35 mg/dL   Calcium 8.9  8.4 - 10.5 mg/dL   Total Protein 7.8  6.0 - 8.3 g/dL   Albumin 2.8 (*) 3.5 - 5.2 g/dL   AST 10  0 - 37 U/L   ALT 7  0 - 53 U/L   Alkaline Phosphatase 87  39 - 117 U/L   Total Bilirubin 0.5  0.3 - 1.2 mg/dL   GFR calc non Af Amer >90  >90 mL/min   GFR calc Af Amer >90  >90 mL/min   Comment: (NOTE)     The eGFR has been calculated using the CKD EPI equation.     This calculation has not been validated in all clinical situations.     eGFR's persistently <90 mL/min signify possible Chronic Kidney     Disease.   Anion gap 14  5 - 15  CBC     Status: Abnormal   Collection Time    07/21/14  1:22 AM      Result Value Ref Range   WBC 7.3  4.0 - 10.5 K/uL   RBC 4.73  4.22 - 5.81 MIL/uL   Hemoglobin 13.6  13.0 - 17.0 g/dL   HCT 38.3 (*) 39.0 - 52.0 %   MCV 81.0  78.0 - 100.0 fL   MCH 28.8  26.0 - 34.0 pg   MCHC 35.5  30.0 - 36.0 g/dL   RDW 12.6  11.5 - 15.5 %   Platelets 314  150 - 400 K/uL  TROPONIN I     Status: None   Collection Time    07/21/14  1:22 AM      Result Value Ref Range   Troponin I <0.30  <0.30 ng/mL   Comment:            Due to the release kinetics of cTnI,     a negative result within the first hours     of the onset of symptoms does not rule out     myocardial infarction with certainty.     If myocardial infarction is still suspected,      repeat the test at appropriate intervals.  GLUCOSE, CAPILLARY     Status: Abnormal   Collection Time    07/21/14  6:44 AM      Result Value Ref Range   Glucose-Capillary 237 (*) 70 - 99 mg/dL   Comment 1 Documented in Chart     Comment 2 Notify RN    GLUCOSE, CAPILLARY     Status: Abnormal   Collection Time    07/21/14 11:47 AM      Result Value Ref Range   Glucose-Capillary 161 (*) 70 - 99 mg/dL  URINALYSIS, ROUTINE W REFLEX MICROSCOPIC     Status: Abnormal   Collection Time    07/21/14  1:16 PM  Result Value Ref Range   Color, Urine AMBER (*) YELLOW   Comment: BIOCHEMICALS MAY BE AFFECTED BY COLOR   APPearance CLEAR  CLEAR   Specific Gravity, Urine 1.027  1.005 - 1.030   pH 5.5  5.0 - 8.0   Glucose, UA 500 (*) NEGATIVE mg/dL   Hgb urine dipstick TRACE (*) NEGATIVE   Bilirubin Urine NEGATIVE  NEGATIVE   Ketones, ur 15 (*) NEGATIVE mg/dL   Protein, ur >300 (*) NEGATIVE mg/dL   Urobilinogen, UA 1.0  0.0 - 1.0 mg/dL   Nitrite NEGATIVE  NEGATIVE   Leukocytes, UA NEGATIVE  NEGATIVE  URINE MICROSCOPIC-ADD ON     Status: None   Collection Time    07/21/14  1:16 PM      Result Value Ref Range   WBC, UA 0-2  <3 WBC/hpf   RBC / HPF 0-2  <3 RBC/hpf   Bacteria, UA RARE  RARE  GLUCOSE, CAPILLARY     Status: Abnormal   Collection Time    07/21/14  4:24 PM      Result Value Ref Range   Glucose-Capillary 128 (*) 70 - 99 mg/dL  GLUCOSE, CAPILLARY     Status: None   Collection Time    07/21/14  9:47 PM      Result Value Ref Range   Glucose-Capillary 90  70 - 99 mg/dL  COMPREHENSIVE METABOLIC PANEL     Status: Abnormal   Collection Time    07/22/14  4:47 AM      Result Value Ref Range   Sodium 123 (*) 137 - 147 mEq/L   Comment: DELTA CHECK NOTED     REPEATED TO VERIFY   Potassium 4.0  3.7 - 5.3 mEq/L   Chloride 86 (*) 96 - 112 mEq/L   CO2 24  19 - 32 mEq/L   Glucose, Bld 111 (*) 70 - 99 mg/dL   BUN 16  6 - 23 mg/dL   Creatinine, Ser 0.93  0.50 - 1.35 mg/dL   Calcium 8.6   8.4 - 10.5 mg/dL   Total Protein 7.5  6.0 - 8.3 g/dL   Albumin 2.8 (*) 3.5 - 5.2 g/dL   AST 16  0 - 37 U/L   ALT 12  0 - 53 U/L   Alkaline Phosphatase 86  39 - 117 U/L   Total Bilirubin 0.6  0.3 - 1.2 mg/dL   GFR calc non Af Amer >90  >90 mL/min   GFR calc Af Amer >90  >90 mL/min   Comment: (NOTE)     The eGFR has been calculated using the CKD EPI equation.     This calculation has not been validated in all clinical situations.     eGFR's persistently <90 mL/min signify possible Chronic Kidney     Disease.   Anion gap 13  5 - 15  CBC     Status: Abnormal   Collection Time    07/22/14  4:47 AM      Result Value Ref Range   WBC 8.3  4.0 - 10.5 K/uL   RBC 4.61  4.22 - 5.81 MIL/uL   Hemoglobin 13.3  13.0 - 17.0 g/dL   HCT 38.3 (*) 39.0 - 52.0 %   MCV 83.1  78.0 - 100.0 fL   MCH 28.9  26.0 - 34.0 pg   MCHC 34.7  30.0 - 36.0 g/dL   RDW 12.8  11.5 - 15.5 %   Platelets 336  150 - 400 K/uL  GLUCOSE, CAPILLARY     Status: Abnormal   Collection Time    07/22/14  7:23 AM      Result Value Ref Range   Glucose-Capillary 140 (*) 70 - 99 mg/dL    Physical Findings: AIMS:  , ,  ,  ,    CIWA:    COWS:     Treatment Plan Summary: Daily contact with patient to assess and evaluate symptoms and progress in treatment Medication management  Plan: Increase Zoloft 50 mg PO Qam for depression starting tomorrow morning Start Trazadone 50 mg PO Qhs for insomnia Continue Geodon 20 mg Qhs for psychosis Monitor for adverse effects and therapeutic benefits Psych consultation will follow while admitted to hospital  Provide out patient referral when medically stable  Medical Decision Making Problem Points:  Established problem, worsening (2), New problem, with no additional work-up planned (3) and Review of psycho-social stressors (1) Data Points:  Review or order clinical lab tests (1) Review and summation of old records (2) Review of medication regiment & side effects (2) Review of new  medications or change in dosage (2)  I certify that inpatient services furnished can reasonably be expected to improve the patient's condition.   Shalla Bulluck,JANARDHAHA R. 07/22/2014, 10:04 AM

## 2014-07-22 NOTE — Progress Notes (Signed)
Patient ID: Justin KeensLarry B Comas, male   DOB: 06/20/1956, 58 y.o.   MRN: [id number] Patient continues to complain of significant neck pain and headache consistent with a subarachnoid hemorrhage Decrease in sodium was noted Appreciate help of nephrology service The current time we'll simply observe his neurologic status.

## 2014-07-22 NOTE — Progress Notes (Addendum)
Pt c/o headache this afternoon, when asked, he reports that it is "different" and more severe (9/10 and "burning") than it has been since his admission. 100 mcg Fentanyl given.  Neuro exam without any changes.  TRH MD paged. Pt reports some relief (5/10)  Pt diaphoretic as well, Tylenol was given 1546, temp now 98.9.  TRH MD repaged, no new orders at this time.

## 2014-07-22 NOTE — Progress Notes (Signed)
  Echocardiogram 2D Echocardiogram has been performed.  Romin Divita FRANCES 07/22/2014, 4:16 PM

## 2014-07-22 NOTE — Clinical Social Work Note (Signed)
Clinical Social Worker continuing to follow patient and family for support and discharge planning needs.  Patient is scheduled to [facility] to [facility] [phone]([address] [place], [place]) once medically stable.  CSW has updated [facility] [phone]) regarding patient current medical status and inability to discharge at this time.  CSW continues to follow patient for support and facilitate patient discharge needs once medically stable.  Justin GoldsJesse Emmalee Delacruz, [place] [phone]

## 2014-07-22 NOTE — Progress Notes (Signed)
MD paged regarding pt headache, 30 mg Toradol ordered Q6 PRN for headache, advised not to give anymore Fentanyl.  If pt headache still an issue in 3 hours, re-page for head CT.  Will pass along to pm RN.

## 2014-07-23 LAB — COMPREHENSIVE METABOLIC PANEL
ALT: 15 U/L (ref 0–53)
ALT: 14 U/L (ref 0–53)
ANION GAP: 12 (ref 5–15)
AST: 16 U/L (ref 0–37)
AST: 16 U/L (ref 0–37)
Albumin: 2.5 g/dL — ABNORMAL LOW (ref 3.5–5.2)
Albumin: 2.6 g/dL — ABNORMAL LOW (ref 3.5–5.2)
Alkaline Phosphatase: 77 U/L (ref 39–117)
Alkaline Phosphatase: 78 U/L (ref 39–117)
Anion gap: 13 (ref 5–15)
BUN: 20 mg/dL (ref 6–23)
BUN: 19 mg/dL (ref 6–23)
CALCIUM: 8.1 mg/dL — AB (ref 8.4–10.5)
CALCIUM: 8.4 mg/dL (ref 8.4–10.5)
CO2: 22 meq/L (ref 19–32)
CO2: 19 mEq/L (ref 19–32)
CREATININE: 0.88 mg/dL (ref 0.50–1.35)
Chloride: 93 mEq/L — ABNORMAL LOW (ref 96–112)
Chloride: 94 mEq/L — ABNORMAL LOW (ref 96–112)
Creatinine, Ser: 0.99 mg/dL (ref 0.50–1.35)
GFR calc Af Amer: >90 mL/min (ref >90)
GFR calc Af Amer: >90 mL/min (ref >90)
GFR, EST NON AFRICAN AMERICAN: 88 mL/min — AB (ref >90)
GLUCOSE: 105 mg/dL — AB (ref 70–99)
GLUCOSE: 116 mg/dL — AB (ref 70–99)
Potassium: 3.5 mEq/L — ABNORMAL LOW (ref 3.7–5.3)
Potassium: 3.9 mEq/L (ref 3.7–5.3)
SODIUM: 127 meq/L — AB (ref 137–147)
Sodium: 126 mEq/L — ABNORMAL LOW (ref 137–147)
Total Bilirubin: 0.7 mg/dL (ref 0.3–1.2)
Total Bilirubin: 0.5 mg/dL (ref 0.3–1.2)
Total Protein: 6.8 g/dL (ref 6.0–8.3)
Total Protein: 7 g/dL (ref 6.0–8.3)

## 2014-07-23 LAB — GLUCOSE, CAPILLARY
GLUCOSE-CAPILLARY: 137 mg/dL — AB (ref 70–99)
GLUCOSE-CAPILLARY: 70 mg/dL (ref 70–99)
Glucose-Capillary: 124 mg/dL — ABNORMAL HIGH (ref 70–99)
Glucose-Capillary: 212 mg/dL — ABNORMAL HIGH (ref 70–99)

## 2014-07-23 LAB — CBC WITH DIFFERENTIAL/PLATELET
Basophils Absolute: 0 10*3/uL (ref 0.0–0.1)
Basophils Relative: 0 % (ref 0–1)
EOS PCT: 0 % (ref 0–5)
Eosinophils Absolute: 0 10*3/uL (ref 0.0–0.7)
HCT: 36.1 % — ABNORMAL LOW (ref 39.0–52.0)
Hemoglobin: 12.8 g/dL — ABNORMAL LOW (ref 13.0–17.0)
LYMPHS ABS: 2.4 10*3/uL (ref 0.7–4.0)
LYMPHS PCT: 33 % (ref 12–46)
MCH: 28.3 pg (ref 26.0–34.0)
MCHC: 35.5 g/dL (ref 30.0–36.0)
MCV: 79.9 fL (ref 78.0–100.0)
MONO ABS: 0.6 10*3/uL (ref 0.1–1.0)
Monocytes Relative: 8 % (ref 3–12)
Neutro Abs: 4.3 10*3/uL (ref 1.7–7.7)
Neutrophils Relative %: 59 % (ref 43–77)
Platelets: 333 10*3/uL (ref 150–400)
RBC: 4.52 MIL/uL (ref 4.22–5.81)
RDW: 12.7 % (ref 11.5–15.5)
WBC: 7.3 10*3/uL (ref 4.0–10.5)

## 2014-07-23 LAB — CBC
HCT: 34.6 % — ABNORMAL LOW (ref 39.0–52.0)
Hemoglobin: 12.3 g/dL — ABNORMAL LOW (ref 13.0–17.0)
MCH: 28.8 pg (ref 26.0–34.0)
MCHC: 35.5 g/dL (ref 30.0–36.0)
MCV: 81 fL (ref 78.0–100.0)
PLATELETS: 318 10*3/uL (ref 150–400)
RBC: 4.27 MIL/uL (ref 4.22–5.81)
RDW: 12.6 % (ref 11.5–15.5)
WBC: 8.2 10*3/uL (ref 4.0–10.5)

## 2014-07-23 LAB — URIC ACID: Uric Acid, Serum: 6.3 mg/dL (ref 4.0–7.8)

## 2014-07-23 LAB — TSH: TSH: 2.49 u[IU]/mL (ref 0.350–4.500)

## 2014-07-23 MED ORDER — POTASSIUM CHLORIDE CRYS ER 20 MEQ PO TBCR
40.0000 meq | EXTENDED_RELEASE_TABLET | Freq: Once | ORAL | Status: AC
  Administered 2014-07-23: 40 meq via ORAL
  Filled 2014-07-23 (×2): qty 2

## 2014-07-23 MED ORDER — VANCOMYCIN HCL IN DEXTROSE 1-5 GM/200ML-% IV SOLN
1000.0000 mg | Freq: Two times a day (BID) | INTRAVENOUS | Status: DC
  Administered 2014-07-23 – 2014-07-25 (×3): 1000 mg via INTRAVENOUS
  Filled 2014-07-23 (×6): qty 200

## 2014-07-23 NOTE — Progress Notes (Signed)
1231 - lab called nurse to inform of positive blood culture in aerobic tube of cocci and clusters.  1232 - nurse informed Dr. Susie CassetteAbrol via text message. Will monitor.   Andrew AuVafiadis, Terry Abila I 07/23/2014 12:34 PM

## 2014-07-23 NOTE — Progress Notes (Signed)
Nurse notified MD, Dr. Susie CassetteAbrol, via text mesaage that pt stated had slight tremor in legs lasting a few seconds. Will closely monitor pt.   Andrew AuVafiadis, Nason Conradt I 07/23/2014 2:10 PM

## 2014-07-23 NOTE — Progress Notes (Signed)
MD, Dr. Susie CassetteAbrol called back and ordered CBC stat and blood cultures x2. Nurse read back and verified and entered orders. Will monitor.  Andrew AuVafiadis, Kaoru Benda I 07/23/2014 5:23 PM

## 2014-07-23 NOTE — Progress Notes (Signed)
[facility] ROUNDING NOTE   Subjective:   Interval History: appears to be doing well  No compaints  Objective:  Vital signs in last 24 hours:  Temp:  [98.3 F (36.8 C)-100.7 F (38.2 C)] 100.7 F (38.2 C) (10/03 0952) Pulse Rate:  [83-111] 100 (10/03 0952) Resp:  [16-20] 20 (10/03 0952) BP: (131-140)/(63-74) 139/74 mmHg (10/03 0952) SpO2:  [98 %-100 %] 99 % (10/03 0952) Weight:  [69.219 kg (152 lb 9.6 oz)] 69.219 kg (152 lb 9.6 oz) (10/03 0500)  Weight change: -0.781 kg (-1 lb 11.6 oz) Filed Weights   07/21/14 0544 07/22/14 0500 07/23/14 0500  Weight: 67 kg (147 lb 11.3 oz) 70 kg (154 lb 5.2 oz) 69.219 kg (152 lb 9.6 oz)    Intake/Output: I/O last 3 completed shifts: In: 480 [P.O.:480] Out: 1525 [Urine:1525]   Intake/Output this shift:     CVS- RRR RS- CTA ABD- BS present soft non-distended EXT- no edema   Basic Metabolic Panel:  Recent Labs Lab 07/16/14 1846 07/17/14 0256 07/18/14 0250 07/21/14 0122 07/22/14 0447 07/22/14 1330 07/23/14 0433  NA 135* 139 132* 130* 123* 123* 127*  K 3.3* 3.2* 4.1 3.9 4.0 4.1 3.5*  CL 96 99 97 92* 86* 87* 93*  CO2 23 24 21 24 24 21 22   GLUCOSE 439* 259* 441* 165* 111* 146* 105*  BUN 16 13 14 15 16 17 20   CREATININE 0.79 0.73 0.85 0.77 0.93 0.86 0.99  CALCIUM 8.5 9.0 8.6 8.9 8.6 8.4 8.1*  MG  --  1.8 1.9  --   --   --   --   PHOS  --  4.1 3.5  --   --   --   --     Liver Function Tests:  Recent Labs Lab 07/16/14 1846 07/21/14 0122 07/22/14 0447 07/23/14 0433  AST 10 10 16 16   ALT 5 7 12 15   ALKPHOS 94 87 86 77  BILITOT 0.3 0.5 0.6 0.7  PROT 7.5 7.8 7.5 6.8  ALBUMIN 3.3* 2.8* 2.8* 2.5*    Recent Labs Lab 07/16/14 1846  LIPASE 51   No results found for this basename: AMMONIA,  in the last 168 hours  CBC:  Recent Labs Lab 07/16/14 1846 07/17/14 0256 07/18/14 0250 07/21/14 0122 07/22/14 0447 07/23/14 0433  WBC 13.8* 9.9 8.3 7.3 8.3 8.2  NEUTROABS 11.3*  --   --   --   --   --    HGB 12.8* 13.5 12.9* 13.6 13.3 12.3*  HCT 36.8* 37.7* 37.1* 38.3* 38.3* 34.6*  MCV 83.4 81.4 84.3 81.0 83.1 81.0  PLT 239 233 238 314 336 318    Cardiac Enzymes:  Recent Labs Lab 07/16/14 1846 07/20/14 1138 07/20/14 1907 07/21/14 0122  TROPONINI <0.30 <0.30 <0.30 <0.30    BNP: No components found with this basename: POCBNP,   CBG:  Recent Labs Lab 07/22/14 0723 07/22/14 1126 07/22/14 1647 07/22/14 2112 07/23/14 0639  GLUCAP 140* 199* 175* 124* 124*    Microbiology: Results for orders placed during the hospital encounter of 07/16/14  MRSA PCR SCREENING     Status: None   Collection Time    07/16/14  9:15 PM      Result Value Ref Range Status   MRSA by PCR NEGATIVE  NEGATIVE Final   Comment:            The GeneXpert MRSA Assay (FDA     approved for NASAL specimens     only), is one  component of a     comprehensive MRSA colonization     surveillance program. It is not     intended to diagnose MRSA     infection nor to guide or     monitor treatment for     MRSA infections.    Coagulation Studies: No results found for this basename: LABPROT, INR,  in the last 72 hours  Urinalysis:  Recent Labs  07/21/14 1316  COLORURINE AMBER*  LABSPEC 1.027  PHURINE 5.5  GLUCOSEU 500*  HGBUR TRACE*  BILIRUBINUR NEGATIVE  KETONESUR 15*  PROTEINUR >300*  UROBILINOGEN 1.0  NITRITE NEGATIVE  LEUKOCYTESUR NEGATIVE      Imaging: Dg Chest 2 View  07/21/2014   CLINICAL DATA:  Shortness of breath.  Weakness.  EXAM: CHEST  2 VIEW  COMPARISON:  07/16/2014  FINDINGS: Low lung volumes are present, causing crowding of the pulmonary vasculature. The lungs appear clear. Cardiac and mediastinal contours normal. No pleural effusion identified. Thoracic spondylosis noted. Old healed left posterolateral rib fractures.  IMPRESSION: 1. No acute/active abnormalities. 2. Thoracic spondylosis. 3. Low lung volumes.   Electronically Signed   By: Herbie Baltimore M.D.   On: 07/21/2014  11:05     Medications:     . amLODipine  10 mg Oral Daily  . furosemide  40 mg Intravenous Daily  . hydrALAZINE  50 mg Oral 3 times per day  . insulin aspart  0-20 Units Subcutaneous TID WC  . insulin aspart  0-5 Units Subcutaneous QHS  . insulin aspart protamine- aspart  35 Units Subcutaneous BID WC  . pantoprazole  40 mg Oral QHS  . sodium chloride  2 g Oral 6 X Daily  . 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

## 2014-07-23 NOTE — Progress Notes (Signed)
Received call from lab regarding critical lab value of 2nd set of blood cultures positive for cocci and clusters.Nurse notified MD, Dr. Susie CassetteAbrol via text message. Will continue to monitor pt closely.   Andrew AuVafiadis, Amram Maya I 07/23/2014 4:59 PM
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
## 2014-07-23 NOTE — Progress Notes (Signed)
ANTIBIOTIC CONSULT NOTE - INITIAL  Pharmacy Consult for vancomycin Indication: 1 of 2 BC GPC in clusters  Allergies  Allergen Reactions  . Fish Allergy     All seafood makes his throat swell  . Tomato Anaphylaxis and Shortness Of Breath    Cannot breathe  . Lactose Intolerance (Gi) Diarrhea and Nausea And Vomiting    Patient Measurements: Height: 5\' 7"  (170.2 cm) Weight: 152 lb 9.6 oz (69.219 kg) IBW/kg (Calculated) : 66.1   Vital Signs: Temp: 99 F (37.2 C) (10/03 1140) Temp Source: Oral (10/03 1140) BP: 139/74 mmHg (10/03 0952) Pulse Rate: 100 (10/03 0952) Intake/Output from previous day: 10/02 0701 - 10/03 0700 In: 480 [P.O.:480] Out: 1125 [Urine:1125] Intake/Output from this shift: Total I/O In: -  Out: 400 [Urine:400]  Labs:  Recent Labs  07/21/14 0122 07/22/14 0447 07/22/14 1330 07/23/14 0433  WBC 7.3 8.3  --  8.2  HGB 13.6 13.3  --  12.3*  PLT 314 336  --  318  CREATININE 0.77 0.93 0.86 0.99   Estimated Creatinine Clearance: 75.1 ml/min (by C-G formula based on Cr of 0.99). No results found for this basename: VANCOTROUGH, Leodis Binet, VANCORANDOM, GENTTROUGH, GENTPEAK, GENTRANDOM, TOBRATROUGH, TOBRAPEAK, TOBRARND, AMIKACINPEAK, AMIKACINTROU, AMIKACIN,  in the last 72 hours   Microbiology: Recent Results (from the past 720 hour(s))  MRSA PCR SCREENING     Status: None   Collection Time    07/16/14  9:15 PM      Result Value Ref Range Status   MRSA by PCR NEGATIVE  NEGATIVE Final   Comment:            The GeneXpert MRSA Assay (FDA     approved for NASAL specimens     only), is one component of a     comprehensive MRSA colonization     surveillance program. It is not     intended to diagnose MRSA     infection nor to guide or     monitor treatment for     MRSA infections.  CULTURE, BLOOD (ROUTINE X 2)     Status: None   Collection Time    07/22/14  1:35 PM      Result Value Ref Range Status   Specimen Description BLOOD LEFT ARM   Final   Special Requests BOTTLES DRAWN AEROBIC ONLY 10CC   Final   Culture  Setup Time     Final   Value: 07/22/2014 17:54     Performed at [facility]   Culture     Final   Value: GRAM POSITIVE COCCI IN CLUSTERS     Note: Gram Stain Report Called to,Read Back By and Verified With: ANNA MARIA VASIADIAS 07/23/14 @ 1231PM BY RUSCOE A.     Performed at [facility]   Report Status PENDING   Incomplete    Medical History: Past Medical History  Diagnosis Date  . Diabetes mellitus   . Hypertension   . Gout   . MI (myocardial infarction)   . Chronic pain     right elbow  . Major depression, chronic   . Bipolar disorder   . Schizophrenia     Assessment: 58 yo M with 1 of 2 BC + for GPC in clusters.  Pharmacy consulted to dose vancomycin.  WBC wnl at 8.2, Tmax 102.1 on 10/2 at 0545 am - now afebrile.  Fever thought to be autonomic dysfunction in setting of SAH.    10/2 BCx2>> 1 of 2 GPC  clusters MRSA PCR negative vanc 10/3>>  Goal of Therapy:  Vancomycin trough level 15-20 mcg/ml  Plan:  -vancomycin 1000 mg IV q12h -f/u renal fxn, wbc, temp, culture data -vanc trough as needed  Herby AbrahamMichelle T. Dorann Davidson, Pharm.D. [phone] 07/23/2014 1:00 PM

## 2014-07-23 NOTE — Progress Notes (Signed)
1252 - Dr. Susie CassetteAbrol called nurse and ordered vacomycin per pharmacy consult. Nurse read back and verified and entered order. Will monitor.  Andrew AuVafiadis, Trichelle Lehan I 07/23/2014 12:54 PM

## 2014-07-23 NOTE — Progress Notes (Signed)
Overall stable. Headache and neck stiffness unchanged. No new neurologic symptoms.  Awake and alert. Oriented and reasonably appropriate. Motor and sensory function intact.  Sodium increased to 127 today.  Progressing reasonably well following subarachnoid hemorrhage. Continue efforts at slow mobilization.

## 2014-07-24 DIAGNOSIS — B9689 Other specified bacterial agents as the cause of diseases classified elsewhere: Secondary | ICD-10-CM

## 2014-07-24 DIAGNOSIS — E119 Type 2 diabetes mellitus without complications: Secondary | ICD-10-CM

## 2014-07-24 DIAGNOSIS — E871 Hypo-osmolality and hyponatremia: Secondary | ICD-10-CM

## 2014-07-24 DIAGNOSIS — I10 Essential (primary) hypertension: Secondary | ICD-10-CM

## 2014-07-24 DIAGNOSIS — F1490 Cocaine use, unspecified, uncomplicated: Secondary | ICD-10-CM

## 2014-07-24 LAB — GLUCOSE, CAPILLARY
GLUCOSE-CAPILLARY: 171 mg/dL — AB (ref 70–99)
GLUCOSE-CAPILLARY: 97 mg/dL (ref 70–99)
GLUCOSE-CAPILLARY: 141 mg/dL — AB (ref 70–99)
GLUCOSE-CAPILLARY: 76 mg/dL (ref 70–99)
Glucose-Capillary: 198 mg/dL — ABNORMAL HIGH (ref 70–99)

## 2014-07-24 LAB — HIV ANTIBODY (ROUTINE TESTING W REFLEX): HIV 1&2 Ab, 4th Generation: NONREACTIVE

## 2014-07-24 LAB — RPR

## 2014-07-24 LAB — CORTISOL: CORTISOL PLASMA: 14.7 ug/dL

## 2014-07-24 LAB — HEPATITIS PANEL, ACUTE
HCV Ab: NEGATIVE
Hep A IgM: NONREACTIVE
Hep B C IgM: NONREACTIVE
Hepatitis B Surface Ag: NEGATIVE
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
## 2014-07-24 NOTE — Progress Notes (Signed)
Patient ID: Justin KeensLarry B Delacruz, male   DOB: 10/01/1956, 58 y.o.   MRN: [id number] Subjective: Patient reports only mild headache. No visual changes or numbness tingling or weakness  Objective: Vital signs in last 24 hours: Temp:  [98.2 F (36.8 C)-99.8 F (37.7 C)] 99.8 F (37.7 C) (10/04 0938) Pulse Rate:  [85-104] 100 (10/04 0938) Resp:  [18-20] 20 (10/04 0938) BP: (122-159)/(57-75) 142/70 mmHg (10/04 0938) SpO2:  [97 %-100 %] 99 % (10/04 0938) Weight:  [72 kg (158 lb 11.7 oz)] 72 kg (158 lb 11.7 oz) (10/04 0515)  Intake/Output from previous day: 10/03 0701 - 10/04 0700 In: -  Out: 700 [Urine:700] Intake/Output this shift:    Neurologic: Grossly normal  Lab Results: Lab Results  Component Value Date   WBC 7.3 07/23/2014   HGB 12.8* 07/23/2014   HCT 36.1* 07/23/2014   MCV 79.9 07/23/2014   PLT 333 07/23/2014   Lab Results  Component Value Date   INR 0.95 07/16/2014   BMET Lab Results  Component Value Date   NA 126* 07/23/2014   K 3.9 07/23/2014   CL 94* 07/23/2014   CO2 19 07/23/2014   GLUCOSE 116* 07/23/2014   BUN 19 07/23/2014   CREATININE 0.88 07/23/2014   CALCIUM 8.4 07/23/2014    Studies/Results: No results found.  Assessment/Plan: Seems okay from a neurologic standpoint. Appears he has been bacteremic and that is being worked up. Sodium 126   LOS: 8 days    Kylena Mole S 07/24/2014, 10:05 AM

## 2014-07-24 NOTE — Consult Note (Signed)
[name]  Date of Admission:  07/16/2014  Date of Consult:  07/24/2014  Reason for Consult: CNS bacteremia Referring Physician: Allyson Sabal  Impression/Recommendation CNS bacteremia Hyponatremia HTN DM, uncontrolled (A1C 12.2%) Cocaine use SAH  Would consider TEE with his recent SAH and hx of drug use (he states he smoked) Continue vanco while we await sensi of his staph Psy f/u States he has quit using cocaine Check HIV, hepatitis panel Diabetic control Repeat BCx are pending. Will recheck as not clear if these were done off anbx  Thank you so much for this interesting consult,   Bobby Rumpf (pager) [phone] [email or web address]  [name] is an 58 y.o. male.  HPI: 58 yo M with hx of schizophrenia, DM2, HTN, cocaine use adm on 9-26 with subarachnoid hemorrhage. He was admitted to [facility] due to HTN and he was eval by neurosurgery (but did not have surgery). He was abel to be transferred to [facility] on 9-30. By 10-1 he developed temp to 100.9. By 10-2 this increased to 102.1. He had BCx done which have since grown 2/2 CNS. He was started on vancomycin today.   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   Past Medical History  Diagnosis Date  . Diabetes mellitus   . Hypertension   . Gout   . MI (myocardial infarction)   . Chronic pain     right elbow  . Major depression, chronic   . Bipolar disorder   . Schizophrenia     Past Surgical History  Procedure Laterality Date  . Coronary angioplasty with stent placement      2008  . Knee surgery      both     Allergies  Allergen Reactions  . Fish Allergy     All seafood makes his throat swell  . Tomato Anaphylaxis and Shortness Of Breath    Cannot breathe  . Lactose Intolerance (Gi) Diarrhea and Nausea And Vomiting    Medications:  Scheduled: . amLODipine  10 mg Oral Daily  . furosemide  40 mg Intravenous Daily  .  hydrALAZINE  50 mg Oral 3 times per day  . insulin aspart  0-20 Units Subcutaneous TID WC  . insulin aspart  0-5 Units Subcutaneous QHS  . insulin aspart protamine- aspart  35 Units Subcutaneous BID WC  . pantoprazole  40 mg Oral QHS  . sodium chloride  2 g Oral 6 X Daily  . tuberculin  5 Units Intradermal Once  . vancomycin  1,000 mg Intravenous Q12H    Abtx:  Anti-infectives   Start     Dose/Rate Route Frequency Ordered Stop   07/23/14 1400  vancomycin (VANCOCIN) IVPB 1000 mg/200 mL premix     1,000 mg 200 mL/hr over 60 Minutes Intravenous Every 12 hours 07/23/14 1259        Total days of antibiotics 2 (vanco)          Social History:  reports that he quit smoking about 18 months ago. His smoking use included Cigarettes. He smoked 0.00 packs per day for .1 years. He has never used smokeless tobacco. He reports that he does not drink alcohol or use illicit drugs.  Family History  Problem Relation Age of Onset  . Diabetic kidney disease Mother   . Hypertension Mother   . Gout Mother   . Diabetic kidney disease Father   . Heart attack Brother 77  General ROS: occas numbness LLE, poor apetite, no BM, normal urination, no vision change. see HPI.   Blood pressure 142/70, pulse 100, temperature 99.8 F (37.7 C), temperature source Oral, resp. rate 20, height 5' 7" (1.702 m), weight 72 kg (158 lb 11.7 oz), SpO2 99.00%. General appearance: alert, cooperative and no distress Eyes: negative findings: pupils equal, round, reactive to light and accomodation Throat: normal findings: oropharynx pink & moist without lesions or evidence of thrush Neck: no adenopathy and supple, symmetrical, trachea midline Lungs: clear to auscultation bilaterally Heart: regular rate and rhythm Abdomen: normal findings: bowel sounds normal and soft, non-tender Extremities: edema none.  and no diabetic foot lesions. he has no cordis or tenderness in his UE.    Results for orders placed during the  hospital encounter of 07/16/14 (from the past 48 hour(s))  GLUCOSE, CAPILLARY     Status: Abnormal   Collection Time    07/22/14 11:26 AM      Result Value Ref Range   Glucose-Capillary 199 (*) 70 - 99 mg/dL   Comment 1 Notify RN     Comment 2 Documented in Chart    CULTURE, BLOOD (ROUTINE X 2)     Status: None   Collection Time    07/22/14  1:26 PM      Result Value Ref Range   Specimen Description BLOOD RIGHT ARM     Special Requests BOTTLES DRAWN AEROBIC AND ANAEROBIC 10CC     Culture  Setup Time       Value: 07/22/2014 17:54     Performed at [facility]   Culture       Value: STAPHYLOCOCCUS SPECIES (COAGULASE NEGATIVE)     Note: Gram Stain Report Called to,Read Back By and Verified With: ANNA MARIA VASIADIAS 07/23/14 @ 456PM BY RUSCOE A.     Performed at [facility]   Report Status PENDING    BASIC METABOLIC PANEL     Status: Abnormal   Collection Time    07/22/14  1:30 PM      Result Value Ref Range   Sodium 123 (*) 137 - 147 mEq/L   Potassium 4.1  3.7 - 5.3 mEq/L   Chloride 87 (*) 96 - 112 mEq/L   CO2 21  19 - 32 mEq/L   Glucose, Bld 146 (*) 70 - 99 mg/dL   BUN 17  6 - 23 mg/dL   Creatinine, Ser 0.86  0.50 - 1.35 mg/dL   Calcium 8.4  8.4 - 10.5 mg/dL   GFR calc non Af Amer >90  >90 mL/min   GFR calc Af Amer >90  >90 mL/min   Comment: (NOTE)     The eGFR has been calculated using the CKD EPI equation.     This calculation has not been validated in all clinical situations.     eGFR's persistently <90 mL/min signify possible Chronic Kidney     Disease.   Anion gap 15  5 - 15  OSMOLALITY     Status: Abnormal   Collection Time    07/22/14  1:30 PM      Result Value Ref Range   Osmolality 262 (*) 275 - 300 mOsm/kg   Comment: Performed at [facility], BLOOD (ROUTINE X 2)     Status: None   Collection Time    07/22/14  1:35 PM      Result Value Ref Range   Specimen Description BLOOD LEFT ARM     Special  Requests BOTTLES DRAWN  AEROBIC ONLY 10CC     Culture  Setup Time       Value: 07/22/2014 17:54     Performed at [facility]   Culture       Value: STAPHYLOCOCCUS SPECIES (COAGULASE NEGATIVE)     Note: RIFAMPIN AND GENTAMICIN SHOULD NOT BE USED AS SINGLE DRUGS FOR TREATMENT OF STAPH INFECTIONS.     Note: Gram Stain Report Called to,Read Back By and Verified With: ANNA MARIA VASIADIAS 07/23/14 @ 1231PM BY RUSCOE A.     Performed at [facility]   Report Status PENDING    OSMOLALITY, URINE     Status: None   Collection Time    07/22/14  3:34 PM      Result Value Ref Range   Osmolality, Ur 663  390 - 1090 mOsm/kg   Comment: Performed at [facility], CAPILLARY     Status: Abnormal   Collection Time    07/22/14  4:47 PM      Result Value Ref Range   Glucose-Capillary 175 (*) 70 - 99 mg/dL   Comment 1 Notify RN     Comment 2 Documented in Chart    GLUCOSE, CAPILLARY     Status: Abnormal   Collection Time    07/22/14  9:12 PM      Result Value Ref Range   Glucose-Capillary 124 (*) 70 - 99 mg/dL   Comment 1 Documented in Chart     Comment 2 Notify RN    COMPREHENSIVE METABOLIC PANEL     Status: Abnormal   Collection Time    07/23/14  4:33 AM      Result Value Ref Range   Sodium 127 (*) 137 - 147 mEq/L   Potassium 3.5 (*) 3.7 - 5.3 mEq/L   Chloride 93 (*) 96 - 112 mEq/L   CO2 22  19 - 32 mEq/L   Glucose, Bld 105 (*) 70 - 99 mg/dL   BUN 20  6 - 23 mg/dL   Creatinine, Ser 0.99  0.50 - 1.35 mg/dL   Calcium 8.1 (*) 8.4 - 10.5 mg/dL   Total Protein 6.8  6.0 - 8.3 g/dL   Albumin 2.5 (*) 3.5 - 5.2 g/dL   AST 16  0 - 37 U/L   ALT 15  0 - 53 U/L   Alkaline Phosphatase 77  39 - 117 U/L   Total Bilirubin 0.7  0.3 - 1.2 mg/dL   GFR calc non Af Amer 88 (*) >90 mL/min   GFR calc Af Amer >90  >90 mL/min   Comment: (NOTE)     The eGFR has been calculated using the CKD EPI equation.     This calculation has not been validated in all clinical situations.     eGFR's persistently  <90 mL/min signify possible Chronic Kidney     Disease.   Anion gap 12  5 - 15  CBC     Status: Abnormal   Collection Time    07/23/14  4:33 AM      Result Value Ref Range   WBC 8.2  4.0 - 10.5 K/uL   RBC 4.27  4.22 - 5.81 MIL/uL   Hemoglobin 12.3 (*) 13.0 - 17.0 g/dL   HCT 34.6 (*) 39.0 - 52.0 %   MCV 81.0  78.0 - 100.0 fL   MCH 28.8  26.0 - 34.0 pg   MCHC 35.5  30.0 - 36.0 g/dL   RDW 12.6  11.5 - 15.5 %   Platelets 318  150 - 400 K/uL  URIC ACID     Status: None   Collection Time    07/23/14  4:33 AM      Result Value Ref Range   Uric Acid, Serum 6.3  4.0 - 7.8 mg/dL  GLUCOSE, CAPILLARY     Status: Abnormal   Collection Time    07/23/14  6:39 AM      Result Value Ref Range   Glucose-Capillary 124 (*) 70 - 99 mg/dL   Comment 1 Documented in Chart     Comment 2 Notify RN    GLUCOSE, CAPILLARY     Status: Abnormal   Collection Time    07/23/14 11:19 AM      Result Value Ref Range   Glucose-Capillary 137 (*) 70 - 99 mg/dL   Comment 1 Notify RN     Comment 2 Documented in Chart    TSH     Status: None   Collection Time    07/23/14 11:50 AM      Result Value Ref Range   TSH 2.490  0.350 - 4.500 uIU/mL  GLUCOSE, CAPILLARY     Status: Abnormal   Collection Time    07/23/14  4:32 PM      Result Value Ref Range   Glucose-Capillary 212 (*) 70 - 99 mg/dL   Comment 1 Notify RN     Comment 2 Documented in Chart    CBC WITH DIFFERENTIAL     Status: Abnormal   Collection Time    07/23/14  6:20 PM      Result Value Ref Range   WBC 7.3  4.0 - 10.5 K/uL   RBC 4.52  4.22 - 5.81 MIL/uL   Hemoglobin 12.8 (*) 13.0 - 17.0 g/dL   HCT 36.1 (*) 39.0 - 52.0 %   MCV 79.9  78.0 - 100.0 fL   MCH 28.3  26.0 - 34.0 pg   MCHC 35.5  30.0 - 36.0 g/dL   RDW 12.7  11.5 - 15.5 %   Platelets 333  150 - 400 K/uL   Neutrophils Relative % 59  43 - 77 %   Neutro Abs 4.3  1.7 - 7.7 K/uL   Lymphocytes Relative 33  12 - 46 %   Lymphs Abs 2.4  0.7 - 4.0 K/uL   Monocytes Relative 8  3 - 12 %    Monocytes Absolute 0.6  0.1 - 1.0 K/uL   Eosinophils Relative 0  0 - 5 %   Eosinophils Absolute 0.0  0.0 - 0.7 K/uL   Basophils Relative 0  0 - 1 %   Basophils Absolute 0.0  0.0 - 0.1 K/uL  COMPREHENSIVE METABOLIC PANEL     Status: Abnormal   Collection Time    07/23/14  6:20 PM      Result Value Ref Range   Sodium 126 (*) 137 - 147 mEq/L   Potassium 3.9  3.7 - 5.3 mEq/L   Chloride 94 (*) 96 - 112 mEq/L   CO2 19  19 - 32 mEq/L   Glucose, Bld 116 (*) 70 - 99 mg/dL   BUN 19  6 - 23 mg/dL   Creatinine, Ser 0.88  0.50 - 1.35 mg/dL   Calcium 8.4  8.4 - 10.5 mg/dL   Total Protein 7.0  6.0 - 8.3 g/dL   Albumin 2.6 (*) 3.5 - 5.2 g/dL   AST 16  0 - 37 U/L  ALT 14  0 - 53 U/L   Alkaline Phosphatase 78  39 - 117 U/L   Total Bilirubin 0.5  0.3 - 1.2 mg/dL   GFR calc non Af Amer >90  >90 mL/min   GFR calc Af Amer >90  >90 mL/min   Comment: (NOTE)     The eGFR has been calculated using the CKD EPI equation.     This calculation has not been validated in all clinical situations.     eGFR's persistently <90 mL/min signify possible Chronic Kidney     Disease.   Anion gap 13  5 - 15  GLUCOSE, CAPILLARY     Status: None   Collection Time    07/23/14  9:32 PM      Result Value Ref Range   Glucose-Capillary 70  70 - 99 mg/dL  GLUCOSE, CAPILLARY     Status: Abnormal   Collection Time    07/24/14  2:13 AM      Result Value Ref Range   Glucose-Capillary 171 (*) 70 - 99 mg/dL  GLUCOSE, CAPILLARY     Status: Abnormal   Collection Time    07/24/14  6:58 AM      Result Value Ref Range   Glucose-Capillary 198 (*) 70 - 99 mg/dL      Component Value Date/Time   SDES BLOOD LEFT ARM 07/22/2014 1335   SPECREQUEST BOTTLES DRAWN AEROBIC ONLY 10CC 07/22/2014 1335   CULT  Value: STAPHYLOCOCCUS SPECIES (COAGULASE NEGATIVE) Note: RIFAMPIN AND GENTAMICIN SHOULD NOT BE USED AS SINGLE DRUGS FOR TREATMENT OF STAPH INFECTIONS. Note: Gram Stain Report Called to,Read Back By and Verified With: ANNA MARIA  VASIADIAS 07/23/14 @ 1231PM BY RUSCOE A. Performed at [facility] 07/22/2014 1335   REPTSTATUS PENDING 07/22/2014 1335   No results found. Recent Results (from the past 240 hour(s))  MRSA PCR SCREENING     Status: None   Collection Time    07/16/14  9:15 PM      Result Value Ref Range Status   MRSA by PCR NEGATIVE  NEGATIVE Final   Comment:            The GeneXpert MRSA Assay (FDA     approved for NASAL specimens     only), is one component of a     comprehensive MRSA colonization     surveillance program. It is not     intended to diagnose MRSA     infection nor to guide or     monitor treatment for     MRSA infections.  CULTURE, BLOOD (ROUTINE X 2)     Status: None   Collection Time    07/22/14  1:26 PM      Result Value Ref Range Status   Specimen Description BLOOD RIGHT ARM   Final   Special Requests BOTTLES DRAWN AEROBIC AND ANAEROBIC 10CC   Final   Culture  Setup Time     Final   Value: 07/22/2014 17:54     Performed at [facility]   Culture     Final   Value: STAPHYLOCOCCUS SPECIES (COAGULASE NEGATIVE)     Note: Gram Stain Report Called to,Read Back By and Verified With: ANNA MARIA VASIADIAS 07/23/14 @ 456PM BY RUSCOE A.     Performed at [facility]   Report Status PENDING   Incomplete  CULTURE, BLOOD (ROUTINE X 2)     Status: None   Collection Time    07/22/14  1:35 PM  Result Value Ref Range Status   Specimen Description BLOOD LEFT ARM   Final   Special Requests BOTTLES DRAWN AEROBIC ONLY 10CC   Final   Culture  Setup Time     Final   Value: 07/22/2014 17:54     Performed at [facility]   Culture     Final   Value: STAPHYLOCOCCUS SPECIES (COAGULASE NEGATIVE)     Note: RIFAMPIN AND GENTAMICIN SHOULD NOT BE USED AS SINGLE DRUGS FOR TREATMENT OF STAPH INFECTIONS.     Note: Gram Stain Report Called to,Read Back By and Verified With: ANNA MARIA VASIADIAS 07/23/14 @ 1231PM BY RUSCOE A.     Performed at [facility]   Report  Status PENDING   Incomplete      07/24/2014, 11:17 AM     LOS: 8 days

## 2014-07-24 NOTE — Progress Notes (Signed)
Patient refuses to abide by carb mod/HH diet. Patient in room eating cheetos and potato chips.  Explained to patient and family regarding the importance of his following the recommended diet-patient and family refused.  Patient family is bringing in food.  Will continue to monitor.    Lance BoschAnna Navy Rothschild, RN

## 2014-07-24 NOTE — Progress Notes (Signed)
Pt peripheral IV began to leak and became occluded.  IV was stopped immediately.  Pt did not receive full dose of Vancomycin.  Pt refused a new IV site.  MD notified.   Mila PalmerJohnson,Maleeka Sabatino A , RN 07/24/2014

## 2014-07-25 ENCOUNTER — Inpatient Hospital Stay (HOSPITAL_COMMUNITY): Payer: Medicaid Other

## 2014-07-25 DIAGNOSIS — R7881 Bacteremia: Secondary | ICD-10-CM

## 2014-07-25 DIAGNOSIS — R45851 Suicidal ideations: Secondary | ICD-10-CM

## 2014-07-25 DIAGNOSIS — F332 Major depressive disorder, recurrent severe without psychotic features: Secondary | ICD-10-CM

## 2014-07-25 DIAGNOSIS — F411 Generalized anxiety disorder: Secondary | ICD-10-CM

## 2014-07-25 LAB — GLUCOSE, CAPILLARY
GLUCOSE-CAPILLARY: 125 mg/dL — AB (ref 70–99)
GLUCOSE-CAPILLARY: 229 mg/dL — AB (ref 70–99)
Glucose-Capillary: 239 mg/dL — ABNORMAL HIGH (ref 70–99)
Glucose-Capillary: 168 mg/dL — ABNORMAL HIGH (ref 70–99)

## 2014-07-25 LAB — COMPREHENSIVE METABOLIC PANEL
ALBUMIN: 2.8 g/dL — AB (ref 3.5–5.2)
ALK PHOS: 78 U/L (ref 39–117)
ALT: 13 U/L (ref 0–53)
AST: 15 U/L (ref 0–37)
Anion gap: 17 — ABNORMAL HIGH (ref 5–15)
BILIRUBIN TOTAL: 0.4 mg/dL (ref 0.3–1.2)
BUN: 10 mg/dL (ref 6–23)
CHLORIDE: 91 meq/L — AB (ref 96–112)
CO2: 17 mEq/L — ABNORMAL LOW (ref 19–32)
Calcium: 8.1 mg/dL — ABNORMAL LOW (ref 8.4–10.5)
Creatinine, Ser: 0.74 mg/dL (ref 0.50–1.35)
GFR calc non Af Amer: >90 mL/min (ref >90)
GLUCOSE: 172 mg/dL — AB (ref 70–99)
POTASSIUM: 4.3 meq/L (ref 3.7–5.3)
Sodium: 125 mEq/L — ABNORMAL LOW (ref 137–147)
Total Protein: 7.4 g/dL (ref 6.0–8.3)

## 2014-07-25 LAB — CBC
HEMATOCRIT: 35 % — AB (ref 39.0–52.0)
Hemoglobin: 12.4 g/dL — ABNORMAL LOW (ref 13.0–17.0)
MCH: 28.4 pg (ref 26.0–34.0)
MCHC: 35.4 g/dL (ref 30.0–36.0)
MCV: 80.3 fL (ref 78.0–100.0)
Platelets: 400 10*3/uL (ref 150–400)
RBC: 4.36 MIL/uL (ref 4.22–5.81)
RDW: 12.6 % (ref 11.5–15.5)
WBC: 7.9 10*3/uL (ref 4.0–10.5)

## 2014-07-25 LAB — CULTURE, BLOOD (ROUTINE X 2)

## 2014-07-25 LAB — TROPONIN I: Troponin I: <0.3 ng/mL (ref <0.30)

## 2014-07-25 MED ORDER — POLYETHYLENE GLYCOL 3350 17 G PO PACK
17.0000 g | PACK | Freq: Every day | ORAL | Status: DC
  Administered 2014-07-25 – 2014-07-30 (×6): 17 g via ORAL
  Filled 2014-07-25 (×8): qty 1

## 2014-07-25 MED ORDER — QUETIAPINE FUMARATE 50 MG PO TABS
50.0000 mg | ORAL_TABLET | Freq: Every day | ORAL | Status: DC
  Administered 2014-07-25 – 2014-08-01 (×8): 50 mg via ORAL
  Filled 2014-07-25 (×8): qty 1

## 2014-07-25 MED ORDER — QUETIAPINE FUMARATE 25 MG PO TABS
25.0000 mg | ORAL_TABLET | Freq: Three times a day (TID) | ORAL | Status: DC
  Administered 2014-07-25 – 2014-07-26 (×2): 25 mg via ORAL
  Filled 2014-07-25 (×4): qty 1

## 2014-07-25 MED ORDER — CEFAZOLIN SODIUM-DEXTROSE 2-3 GM-% IV SOLR
2.0000 g | Freq: Three times a day (TID) | INTRAVENOUS | Status: DC
  Administered 2014-07-25 – 2014-08-02 (×24): 2 g via INTRAVENOUS
  Filled 2014-07-25 (×30): qty 50

## 2014-07-25 NOTE — Clinical Social Work Note (Signed)
Clinical Social Worker continuing to follow patient and family for support and discharge planning needs.  CSW reviewed documentation in the chart over the weekend and patient is not yet medically or psychiatrically appropriate for discharge.  Per Psychiatry note from today, recommendation is now for inpatient psych treatment at discharge.  CSW has updated Psych CSW on patient status.  CSW has left a message with Ms. Wyline MoodRucker regarding patient delays for discharge.  CSW has also contacted [facility] to cancel patient outpatient appointment and will reschedule once appropriate.  CSW remains available for support and to facilitate appropriate discharge needs once medically stable.  [name], Kentucky[place]CS[place] [phone]

## 2014-07-25 NOTE — Progress Notes (Signed)
Late Entry: Upon initial assessment at 1940 pt was very agitated and threatening to leave if we couldn't "fix my pain". RN explained that a new IV had to be started in order to give pain medication that is ordered. Pt stated, "Do what you have to do." New IV placed around 1950 in left posterior forearm. Pt has been compliant with receiving both IV and PO medications through night. Will continue to emphasize importance of keeping IV access for antibiotics. Salvadore OxfordJessica Letcher Schweikert, RN 07/25/2014 [phone]

## 2014-07-25 NOTE — Progress Notes (Signed)
[facility] MD Progress Note  07/25/2014 11:09 AM [name]  MRN:  [id number] Subjective:  Patient is seen today for depression and suicidal ideation. 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 His psych medication were discontinued with concern about low sodium level. Spoke with ID consultant who stated that he has no concern about psych medication and asked to discuss with Dr. Allyson Sabal stated that he has been more depressed, anxious and stated that he is ready to give up his life instead of dealing with generalized body pain including neck and back pain but has no specific suicidal plan. Her is in agreement with psych medication to help his mood and insomnia.   Medical history: 58 yo M with hx of schizophrenia, DM2, HTN, cocaine use adm on 9-26 with subarachnoid hemorrhage. He was admitted to [facility]CM due to HTN and he was eval by neurosurgery (but did not have surgery). He was abel to be transferred to [facility] on 9-30. By 10-1 he developed temp to 100.9. By 10-2 this increased to 102.1. He had BCx done which have since grown 2/2 CNS. He was started on vancomycin today. 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  Diagnosis:   DSM5: Schizophrenia Disorders:   Obsessive-Compulsive Disorders:   Trauma-Stressor Disorders:   Substance/Addictive Disorders:   Depressive Disorders:  Major Depressive Disorder - with Psychotic Features (296.24) Total Time spent with patient: 30 minutes  Axis I: Generalized Anxiety Disorder, Major Depression, Recurrent severe and Cocaine abuse  ADL's:  Impaired  Sleep: Poor  Appetite:  Poor  Suicidal Ideation:  Endorses suicidal ideation without plan Homicidal Ideation:  Denied AEB (as evidenced by):  Psychiatric Specialty Exam: Physical Exam  ROS  Blood pressure 162/74, pulse 107, temperature 99 F (37.2 C), temperature source Oral,  resp. rate 20, height '5\' 7"'  (1.702 m), weight 72 kg (158 lb 11.7 oz), SpO2 100.00%.Body mass index is 24.85 kg/(m^2).  General Appearance: Disheveled and Guarded  Eye Contact::  Minimal  Speech:  Clear and Coherent and Slow  Volume:  Decreased  Mood:  Anxious, Depressed and Hopeless  Affect:  Depressed and Flat  Thought Process:  Coherent and Goal Directed  Orientation:  Full (Time, Place, and Person)  Thought Content:  Rumination  Suicidal Thoughts:  Yes.  without intent/plan  Homicidal Thoughts:  No  Memory:  Immediate;   Fair Recent;   Fair  Judgement:  Impaired  Insight:  Fair  Psychomotor Activity:  Psychomotor Retardation  Concentration:  Fair  Recall:  [employer] of Knowledge:Fair  Language: Good  Akathisia:  NA  Handed:  Right  AIMS (if indicated):     Assets:  Communication Skills Leisure Time Resilience Social Support  Sleep:      Musculoskeletal: Strength & Muscle Tone: decreased Gait & Station: unable to stand Patient leans: N/A  Current Medications: Current Facility-Administered Medications  Medication Dose Route Frequency Provider Last Rate Last Dose  . 0.9 %  sodium chloride infusion  250 mL Intravenous PRN Sherril Croon, MD      . acetaminophen (TYLENOL) tablet 650 mg  650 mg Oral Q6H PRN Reyne Dumas, MD   650 mg at 07/25/14 0151  . amLODipine (NORVASC) tablet 10 mg  10 mg Oral Daily Reyne Dumas, MD   10 mg at 07/25/14 0947  . fentaNYL (SUBLIMAZE) injection 25-100 mcg  25-100 mcg Intravenous Q2H PRN  Juanito Doom, MD   25 mcg at 07/24/14 2137  . furosemide (LASIX) injection 40 mg  40 mg Intravenous Daily Reyne Dumas, MD   40 mg at 07/25/14 0947  . hydrALAZINE (APRESOLINE) tablet 50 mg  50 mg Oral 3 times per day Reyne Dumas, MD   50 mg at 07/25/14 0818  . insulin aspart (novoLOG) injection 0-20 Units  0-20 Units Subcutaneous TID WC Collene Gobble, MD   7 Units at 07/25/14 0820  . insulin aspart (novoLOG) injection 0-5 Units  0-5 Units Subcutaneous  QHS Collene Gobble, MD   2 Units at 07/20/14 2221  . insulin aspart protamine- aspart (NOVOLOG MIX 70/30) injection 35 Units  35 Units Subcutaneous BID WC Reyne Dumas, MD   35 Units at 07/25/14 0818  . ketorolac (TORADOL) 30 MG/ML injection 30 mg  30 mg Intravenous Q6H PRN Reyne Dumas, MD   30 mg at 07/24/14 1955  . labetalol (NORMODYNE,TRANDATE) injection 10 mg  10 mg Intravenous QID PRN Reyne Dumas, MD   10 mg at 07/20/14 1231  . metoCLOPramide (REGLAN) injection 5 mg  5 mg Intravenous Q8H PRN Brand Males, MD   5 mg at 07/24/14 1955  . pantoprazole (PROTONIX) EC tablet 40 mg  40 mg Oral QHS Eudelia Bunch, RPH   40 mg at 07/24/14 2137  . sodium chloride tablet 2 g  2 g Oral 6 X Daily Reyne Dumas, MD   2 g at 07/25/14 0819  . tuberculin injection 5 Units  5 Units Intradermal Once Reyne Dumas, MD      . vancomycin (VANCOCIN) IVPB 1000 mg/200 mL premix  1,000 mg Intravenous Q12H Eudelia Bunch, RPH   1,000 mg at 07/25/14 0150    Lab Results:  Results for orders placed during the hospital encounter of 07/16/14 (from the past 48 hour(s))  GLUCOSE, CAPILLARY     Status: Abnormal   Collection Time    07/23/14 11:19 AM      Result Value Ref Range   Glucose-Capillary 137 (*) 70 - 99 mg/dL   Comment 1 Notify RN     Comment 2 Documented in Chart    TSH     Status: None   Collection Time    07/23/14 11:50 AM      Result Value Ref Range   TSH 2.490  0.350 - 4.500 uIU/mL  GLUCOSE, CAPILLARY     Status: Abnormal   Collection Time    07/23/14  4:32 PM      Result Value Ref Range   Glucose-Capillary 212 (*) 70 - 99 mg/dL   Comment 1 Notify RN     Comment 2 Documented in Chart    CULTURE, BLOOD (ROUTINE X 2)     Status: None   Collection Time    07/23/14  6:17 PM      Result Value Ref Range   Specimen Description BLOOD RIGHT ARM     Special Requests BOTTLES DRAWN AEROBIC ONLY 6 CC     Culture  Setup Time       Value: 07/24/2014 00:48     Performed at [facility]    Culture       Value:        BLOOD CULTURE RECEIVED NO GROWTH TO DATE CULTURE WILL BE HELD FOR 5 DAYS BEFORE ISSUING A FINAL NEGATIVE REPORT     Performed at [facility]   Report Status PENDING    CULTURE, BLOOD (ROUTINE X  2)     Status: None   Collection Time    07/23/14  6:20 PM      Result Value Ref Range   Specimen Description BLOOD LEFT ARM     Special Requests BOTTLES DRAWN AEROBIC AND ANAEROBIC 10 CC     Culture  Setup Time       Value: 07/24/2014 00:48     Performed at [facility]   Culture       Value:        BLOOD CULTURE RECEIVED NO GROWTH TO DATE CULTURE WILL BE HELD FOR 5 DAYS BEFORE ISSUING A FINAL NEGATIVE REPORT     Performed at [facility]   Report Status PENDING    CBC WITH DIFFERENTIAL     Status: Abnormal   Collection Time    07/23/14  6:20 PM      Result Value Ref Range   WBC 7.3  4.0 - 10.5 K/uL   RBC 4.52  4.22 - 5.81 MIL/uL   Hemoglobin 12.8 (*) 13.0 - 17.0 g/dL   HCT 36.1 (*) 39.0 - 52.0 %   MCV 79.9  78.0 - 100.0 fL   MCH 28.3  26.0 - 34.0 pg   MCHC 35.5  30.0 - 36.0 g/dL   RDW 12.7  11.5 - 15.5 %   Platelets 333  150 - 400 K/uL   Neutrophils Relative % 59  43 - 77 %   Neutro Abs 4.3  1.7 - 7.7 K/uL   Lymphocytes Relative 33  12 - 46 %   Lymphs Abs 2.4  0.7 - 4.0 K/uL   Monocytes Relative 8  3 - 12 %   Monocytes Absolute 0.6  0.1 - 1.0 K/uL   Eosinophils Relative 0  0 - 5 %   Eosinophils Absolute 0.0  0.0 - 0.7 K/uL   Basophils Relative 0  0 - 1 %   Basophils Absolute 0.0  0.0 - 0.1 K/uL  COMPREHENSIVE METABOLIC PANEL     Status: Abnormal   Collection Time    07/23/14  6:20 PM      Result Value Ref Range   Sodium 126 (*) 137 - 147 mEq/L   Potassium 3.9  3.7 - 5.3 mEq/L   Chloride 94 (*) 96 - 112 mEq/L   CO2 19  19 - 32 mEq/L   Glucose, Bld 116 (*) 70 - 99 mg/dL   BUN 19  6 - 23 mg/dL   Creatinine, Ser 0.88  0.50 - 1.35 mg/dL   Calcium 8.4  8.4 - 10.5 mg/dL   Total Protein 7.0  6.0 - 8.3 g/dL   Albumin 2.6 (*) 3.5  - 5.2 g/dL   AST 16  0 - 37 U/L   ALT 14  0 - 53 U/L   Alkaline Phosphatase 78  39 - 117 U/L   Total Bilirubin 0.5  0.3 - 1.2 mg/dL   GFR calc non Af Amer >90  >90 mL/min   GFR calc Af Amer >90  >90 mL/min   Comment: (NOTE)     The eGFR has been calculated using the CKD EPI equation.     This calculation has not been validated in all clinical situations.     eGFR's persistently <90 mL/min signify possible Chronic Kidney     Disease.   Anion gap 13  5 - 15  GLUCOSE, CAPILLARY     Status: None   Collection Time    07/23/14  9:32 PM  Result Value Ref Range   Glucose-Capillary 70  70 - 99 mg/dL  GLUCOSE, CAPILLARY     Status: Abnormal   Collection Time    07/24/14  2:13 AM      Result Value Ref Range   Glucose-Capillary 171 (*) 70 - 99 mg/dL  CORTISOL     Status: None   Collection Time    07/24/14  4:20 AM      Result Value Ref Range   Cortisol, Plasma 14.7     Comment: (NOTE)     AM:  4.3 - 22.4 ug/dL     PM:  3.1 - 16.7 ug/dL     Performed at [place], CAPILLARY     Status: Abnormal   Collection Time    07/24/14  6:58 AM      Result Value Ref Range   Glucose-Capillary 198 (*) 70 - 99 mg/dL  GLUCOSE, CAPILLARY     Status: None   Collection Time    07/24/14 12:00 PM      Result Value Ref Range   Glucose-Capillary 97  70 - 99 mg/dL   Comment 1 Notify RN     Comment 2 Documented in Chart    HIV ANTIBODY (ROUTINE TESTING)     Status: None   Collection Time    07/24/14  1:00 PM      Result Value Ref Range   HIV 1&2 Ab, 4th Generation NONREACTIVE  NONREACTIVE   Comment: (NOTE)     A NONREACTIVE HIV Ag/Ab result does not exclude HIV infection since     the time frame for seroconversion is variable. If acute HIV infection     is suspected, a HIV-1 RNA Qualitative TMA test is recommended.     HIV-1/2 Antibody Diff         Not indicated.     HIV-1 RNA, Qual TMA           Not indicated.     PLEASE NOTE: This information has been disclosed to you from  records     whose confidentiality may be protected by state law. If your state     requires such protection, then the state law prohibits you from making     any further disclosure of the information without the specific written     consent of the person to whom it pertains, or as otherwise permitted     by law. A general authorization for the release of medical or other     information is NOT sufficient for this purpose.     The performance of this assay has not been clinically validated in     patients less than 67 years old.     Performed at [place], ACUTE     Status: None   Collection Time    07/24/14  1:00 PM      Result Value Ref Range   Hepatitis B Surface Ag NEGATIVE  NEGATIVE   HCV Ab NEGATIVE  NEGATIVE   Hep A IgM NON REACTIVE  NON REACTIVE   Comment: (NOTE)     Effective September 05, 2014, Hepatitis Acute Panel (test code [id number])     will be revised to automatically reflex to the Hepatitis C Viral RNA,     Quantitative, Real-Time PCR assay if the Hepatitis C antibody     screening result is Reactive. This action is being taken to ensure     that the CDC/USPSTF recommended  HCV diagnostic algorithm with the     appropriate test reflex needed for accurate interpretation is     followed.   Hep B C IgM NON REACTIVE  NON REACTIVE   Comment: (NOTE)     High levels of Hepatitis B Core IgM antibody are detectable     during the acute stage of Hepatitis B. This antibody is used     to differentiate current from past HBV infection.     Performed at [facility]  RPR     Status: None   Collection Time    07/24/14  1:00 PM      Result Value Ref Range   RPR NON REAC  NON REAC   Comment: Performed at [place], CAPILLARY     Status: Abnormal   Collection Time    07/24/14  4:13 PM      Result Value Ref Range   Glucose-Capillary 141 (*) 70 - 99 mg/dL   Comment 1 Notify RN     Comment 2 Documented in Chart    GLUCOSE,  CAPILLARY     Status: None   Collection Time    07/24/14  9:05 PM      Result Value Ref Range   Glucose-Capillary 76  70 - 99 mg/dL   Comment 1 Documented in Chart     Comment 2 Notify RN    GLUCOSE, CAPILLARY     Status: Abnormal   Collection Time    07/25/14  6:54 AM      Result Value Ref Range   Glucose-Capillary 239 (*) 70 - 99 mg/dL  CBC     Status: Abnormal   Collection Time    07/25/14  9:44 AM      Result Value Ref Range   WBC 7.9  4.0 - 10.5 K/uL   RBC 4.36  4.22 - 5.81 MIL/uL   Hemoglobin 12.4 (*) 13.0 - 17.0 g/dL   HCT 35.0 (*) 39.0 - 52.0 %   MCV 80.3  78.0 - 100.0 fL   MCH 28.4  26.0 - 34.0 pg   MCHC 35.4  30.0 - 36.0 g/dL   RDW 12.6  11.5 - 15.5 %   Platelets 400  150 - 400 K/uL  TROPONIN I     Status: None   Collection Time    07/25/14 10:00 AM      Result Value Ref Range   Troponin I <0.30  <0.30 ng/mL   Comment:            Due to the release kinetics of cTnI,     a negative result within the first hours     of the onset of symptoms does not rule out     myocardial infarction with certainty.     If myocardial infarction is still suspected,     repeat the test at appropriate intervals.    Physical Findings: AIMS:  , ,  ,  ,    CIWA:    COWS:     Treatment Plan Summary: Daily contact with patient to assess and evaluate symptoms and progress in treatment Medication management  Plan: Psych medication was discontinued as per Infectious disease recommendation due to hyponatremia associated with infection and SAH. Agree with discontinuation of Zoloft and Trazodone.  Patient may receive Geodon or Seroquel which does have hyponatremia as a predominant side effects. He has no EPS Will start Seroquel 25 mg PO TID and 50 mg at bed time for  mood and insomnia as he can't use SSRI due to hyponatremia at this time Continue safety sitter as patient continue to be suicidal and recommend in patient hospitalization when medically stable and able to participate in  ADL's and therapies. Psych consult will follow up tomorrow.    Medical Decision Making Problem Points:  Established problem, worsening (2), New problem, with no additional work-up planned (3), Review of last therapy session (1) and Review of psycho-social stressors (1) Data Points:  Independent review of image, tracing, or specimen (2) Review or order clinical lab tests (1) Review of medication regiment & side effects (2) Review of new medications or change in dosage (2)  I certify that inpatient services furnished can reasonably be expected to improve the patient's condition.   [name],Justin R. 07/25/2014, 11:09 AM

## 2014-07-25 NOTE — Progress Notes (Signed)
Patient ID: Justin KeensLarry B Mose, male   DOB: 10/10/1956, 58 y.o.   MRN: [id number] Complaints the same ... Headache neck pain. Continues supportive care.

## 2014-07-25 NOTE — Progress Notes (Signed)
UR COOMPLETED

## 2014-07-25 NOTE — Progress Notes (Signed)
Writer in the room, told patient that his sister called and wants to know how was doing. Patient said"that's what am saying, why can't you people just let me die". Writer tried to reassure patient and he continued"if you don't let me get out of here, I can burst the window and jump off the cliff". Writer notified the ADON and charge nurse, patient is on suicidal watch.
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
## 2014-07-25 NOTE — Progress Notes (Signed)
[facility]  Date of Admission:  07/16/2014  Antibiotics: Cefazolin (changed today)  Subjective: No acute events  Objective: Temp:  [98.3 F (36.8 C)-102.2 F (39 C)] 99 F (37.2 C) (10/05 0931) Pulse Rate:  [99-119] 107 (10/05 0931) Resp:  [20] 20 (10/05 0931) BP: (136-184)/(55-88) 162/74 mmHg (10/05 0931) SpO2:  [98 %-100 %] 100 % (10/05 0931)    Lab Results Lab Results  Component Value Date   WBC 7.9 07/25/2014   HGB 12.4* 07/25/2014   HCT 35.0* 07/25/2014   MCV 80.3 07/25/2014   PLT 400 07/25/2014    Lab Results  Component Value Date   CREATININE 0.74 07/25/2014   BUN 10 07/25/2014   NA 125* 07/25/2014   K 4.3 07/25/2014   CL 91* 07/25/2014   CO2 17* 07/25/2014    Lab Results  Component Value Date   ALT 13 07/25/2014   AST 15 07/25/2014   ALKPHOS 78 07/25/2014   BILITOT 0.4 07/25/2014      Microbiology: Recent Results (from the past 240 hour(s))  MRSA PCR SCREENING     Status: None   Collection Time    07/16/14  9:15 PM      Result Value Ref Range Status   MRSA by PCR NEGATIVE  NEGATIVE Final   Comment:            The GeneXpert MRSA Assay (FDA     approved for NASAL specimens     only), is one component of a     comprehensive MRSA colonization     surveillance program. It is not     intended to diagnose MRSA     infection nor to guide or     monitor treatment for     MRSA infections.  CULTURE, BLOOD (ROUTINE X 2)     Status: None   Collection Time    07/22/14  1:26 PM      Result Value Ref Range Status   Specimen Description BLOOD RIGHT ARM   Final   Special Requests BOTTLES DRAWN AEROBIC AND ANAEROBIC 10CC   Final   Culture  Setup Time     Final   Value: 07/22/2014 17:54     Performed at [facility]   Culture     Final   Value: STAPHYLOCOCCUS SPECIES (COAGULASE NEGATIVE)     Note: SUSCEPTIBILITIES PERFORMED ON PREVIOUS CULTURE WITHIN THE LAST 5 DAYS.     Note: Gram Stain Report Called to,Read Back By and  Verified With: ANNA MARIA VASIADIAS 07/23/14 @ 456PM BY RUSCOE A.     Performed at [facility]   Report Status 07/25/2014 FINAL   Final  CULTURE, BLOOD (ROUTINE X 2)     Status: None   Collection Time    07/22/14  1:35 PM      Result Value Ref Range Status   Specimen Description BLOOD LEFT ARM   Final   Special Requests BOTTLES DRAWN AEROBIC ONLY 10CC   Final   Culture  Setup Time     Final   Value: 07/22/2014 17:54     Performed at [facility]   Culture     Final   Value: STAPHYLOCOCCUS SPECIES (COAGULASE NEGATIVE)     Note: RIFAMPIN AND GENTAMICIN SHOULD NOT BE USED AS SINGLE DRUGS FOR TREATMENT OF STAPH INFECTIONS. This organism DOES NOT demonstrate inducible Clindamycin resistance in vitro.     Note: Gram Stain Report Called to,Read Back By and  Verified With: ANNA MARIA VASIADIAS 07/23/14 @ 1231PM BY RUSCOE A.     Performed at [facility]   Report Status 07/25/2014 FINAL   Final   Organism ID, Bacteria STAPHYLOCOCCUS SPECIES (COAGULASE NEGATIVE)   Final  CULTURE, BLOOD (ROUTINE X 2)     Status: None   Collection Time    07/23/14  6:17 PM      Result Value Ref Range Status   Specimen Description BLOOD RIGHT ARM   Final   Special Requests BOTTLES DRAWN AEROBIC ONLY 6 CC   Final   Culture  Setup Time     Final   Value: 07/24/2014 00:48     Performed at [facility]   Culture     Final   Value:        BLOOD CULTURE RECEIVED NO GROWTH TO DATE CULTURE WILL BE HELD FOR 5 DAYS BEFORE ISSUING A FINAL NEGATIVE REPORT     Performed at [facility]   Report Status PENDING   Incomplete  CULTURE, BLOOD (ROUTINE X 2)     Status: None   Collection Time    07/23/14  6:20 PM      Result Value Ref Range Status   Specimen Description BLOOD LEFT ARM   Final   Special Requests BOTTLES DRAWN AEROBIC AND ANAEROBIC 10 CC   Final   Culture  Setup Time     Final   Value: 07/24/2014 00:48     Performed at [facility]   Culture     Final   Value:         BLOOD CULTURE RECEIVED NO GROWTH TO DATE CULTURE WILL BE HELD FOR 5 DAYS BEFORE ISSUING A FINAL NEGATIVE REPORT     Performed at [facility]   Report Status PENDING   Incomplete    Studies/Results: Dg Chest Port 1 View  07/25/2014   CLINICAL DATA:  Shortness of breath, cough.  EXAM: PORTABLE CHEST - 1 VIEW  COMPARISON:  July 21, 2014.  FINDINGS: The heart size and mediastinal contours are within normal limits. Both lungs are clear. No pneumothorax or pleural effusion is noted. Old left rib fractures are noted.  IMPRESSION: No acute cardiopulmonary abnormality seen.   Electronically Signed   By: Roque Lias M.D.   On: 07/25/2014 08:55    Assessment/Plan: 1) CoNS bacteremia - TEE with history of drug use.  On cefazolin for MSSE.  Repeat cultures ngtd.   Staci Righter, MD [facility] [email or web address] [phone] pager   [phone] cell 07/25/2014, 2:26 PM

## 2014-07-25 NOTE — Progress Notes (Signed)
ANTIBIOTIC CONSULT NOTE - INITIAL  Pharmacy Consult for cefazolin Indication:  Bacteremia  Allergies  Allergen Reactions  . Fish Allergy     All seafood makes his throat swell  . Tomato Anaphylaxis and Shortness Of Breath    Cannot breathe  . Lactose Intolerance (Gi) Diarrhea and Nausea And Vomiting    Patient Measurements: Height: 5\' 7"  (170.2 cm) Weight: 158 lb 11.7 oz (72 kg) IBW/kg (Calculated) : 66.1   Vital Signs: Temp: 99 F (37.2 C) (10/05 0931) Temp Source: Oral (10/05 0931) BP: 162/74 mmHg (10/05 0931) Pulse Rate: 107 (10/05 0931) Intake/Output from previous day: 10/04 0701 - 10/05 0700 In: -  Out: 325 [Urine:325] Intake/Output from this shift:    Labs:  Recent Labs  07/23/14 0433 07/23/14 1820 07/25/14 0944 07/25/14 1000  WBC 8.2 7.3 7.9  --   HGB 12.3* 12.8* 12.4*  --   PLT 318 333 400  --   CREATININE 0.99 0.88  --  0.74   Estimated Creatinine Clearance: 93 ml/min (by C-G formula based on Cr of 0.74). No results found for this basename: VANCOTROUGH, Leodis Binet, VANCORANDOM, GENTTROUGH, GENTPEAK, GENTRANDOM, TOBRATROUGH, TOBRAPEAK, TOBRARND, AMIKACINPEAK, AMIKACINTROU, AMIKACIN,  in the last 72 hours   Microbiology: Recent Results (from the past 720 hour(s))  MRSA PCR SCREENING     Status: None   Collection Time    07/16/14  9:15 PM      Result Value Ref Range Status   MRSA by PCR NEGATIVE  NEGATIVE Final   Comment:            The GeneXpert MRSA Assay (FDA     approved for NASAL specimens     only), is one component of a     comprehensive MRSA colonization     surveillance program. It is not     intended to diagnose MRSA     infection nor to guide or     monitor treatment for     MRSA infections.  CULTURE, BLOOD (ROUTINE X 2)     Status: None   Collection Time    07/22/14  1:26 PM      Result Value Ref Range Status   Specimen Description BLOOD RIGHT ARM   Final   Special Requests BOTTLES DRAWN AEROBIC AND ANAEROBIC 10CC   Final   Culture  Setup Time     Final   Value: 07/22/2014 17:54     Performed at [facility]   Culture     Final   Value: STAPHYLOCOCCUS SPECIES (COAGULASE NEGATIVE)     Note: SUSCEPTIBILITIES PERFORMED ON PREVIOUS CULTURE WITHIN THE LAST 5 DAYS.     Note: Gram Stain Report Called to,Read Back By and Verified With: ANNA MARIA VASIADIAS 07/23/14 @ 456PM BY RUSCOE A.     Performed at [facility]   Report Status 07/25/2014 FINAL   Final  CULTURE, BLOOD (ROUTINE X 2)     Status: None   Collection Time    07/22/14  1:35 PM      Result Value Ref Range Status   Specimen Description BLOOD LEFT ARM   Final   Special Requests BOTTLES DRAWN AEROBIC ONLY 10CC   Final   Culture  Setup Time     Final   Value: 07/22/2014 17:54     Performed at [facility]   Culture     Final   Value: STAPHYLOCOCCUS SPECIES (COAGULASE NEGATIVE)     Note: RIFAMPIN AND GENTAMICIN SHOULD NOT BE  USED AS SINGLE DRUGS FOR TREATMENT OF STAPH INFECTIONS. This organism DOES NOT demonstrate inducible Clindamycin resistance in vitro.     Note: Gram Stain Report Called to,Read Back By and Verified With: ANNA MARIA VASIADIAS 07/23/14 @ 1231PM BY RUSCOE A.     [facility]   Report Status 07/25/2014 FINAL   Final   Organism ID, Bacteria STAPHYLOCOCCUS SPECIES (COAGULASE NEGATIVE)   Final  CULTURE, BLOOD (ROUTINE X 2)     Status: None   Collection Time    07/23/14  6:17 PM      Result Value Ref Range Status   Specimen Description BLOOD RIGHT ARM   Final   Special Requests BOTTLES DRAWN AEROBIC ONLY 6 CC   Final   Culture  Setup Time     Final   Value: 07/24/2014 00:48     [facility]   Culture     Final   Value:        BLOOD CULTURE RECEIVED NO GROWTH TO DATE CULTURE WILL BE HELD FOR 5 DAYS BEFORE ISSUING A FINAL NEGATIVE REPORT     [facility]   Report Status PENDING   Incomplete  CULTURE, BLOOD (ROUTINE X 2)     Status: None   Collection Time     07/23/14  6:20 PM      Result Value Ref Range Status   Specimen Description BLOOD LEFT ARM   Final   Special Requests BOTTLES DRAWN AEROBIC AND ANAEROBIC 10 CC   Final   Culture  Setup Time     Final   Value: 07/24/2014 00:48     [facility]   Culture     Final   Value:        BLOOD CULTURE RECEIVED NO GROWTH TO DATE CULTURE WILL BE HELD FOR 5 DAYS BEFORE ISSUING A FINAL NEGATIVE REPORT     [facility]formed [facility]   Report Status PENDING   Incomplete    Medical History: Past Medical History  Diagnosis Date  . Diabetes mellitus   . Hypertension   . Gout   . MI (myocardial infarction)   . Chronic pain     right elbow  . Major depression, chronic   . Bipolar disorder   . Schizophrenia     Assessment: 58 yo M with 2 of 2 blood cultures + methicillin sensitive coag neg staph. ID is changing antibiotic from vancomycin to cefazolin. Pharmacy consulted to dose cefazolin.   WBC wnl at 7.9, Tc 99, Tmax 102.2    SCr 0.74,  Estimated CrCl ~ 93 ml/min  10/3 BCx2  NGT 10/2 BCx2>> 2 of 2 GPC clusters, methicillin sensitive staph coag neg. MRSA PCR negative  Vanc 10/3>>10/5 Cefazolin 10/5>>  Goal of Therapy:  Appropriate dosing for renal function and eradication of infection.  Plan:  Cefazolin 2 gm IV q8h -f/u renal fxn, wbc, temp, culture data   [name], RPh Clinical Pharmacist Pager: [phone] 07/25/2014 11:58 AM

## 2014-07-25 NOTE — Progress Notes (Signed)
Patient has an antibiotics due at 1300, notified pharmacy at 1400, and followed up with a call at 1422. Med still not seen yet. Writer just called pharmacy again.

## 2014-07-25 NOTE — Progress Notes (Signed)
Around 0700, pt complained of chest pain on left side of chest radiating to left ribs 7/10. Denies SOB. BP 172/82. Heart rhythm is sinus on telemetry. Given 25 mcg of Fentanyl for chest pain. Dr. Susie CassetteAbrol paged. Dr. Susie CassetteAbrol ordered for STAT Troponin and EKG. MD made aware that pt did refuse AM labs. Passed on in report to day shift RN. Salvadore OxfordJessica Latishia Suitt, RN 07/25/2014 [phone]

## 2014-07-26 ENCOUNTER — Encounter (HOSPITAL_COMMUNITY)
Admission: EM | Disposition: A | Discharge status: Skilled Nursing Facility | Payer: Self-pay | Source: Home / Self Care | Attending: Internal Medicine

## 2014-07-26 ENCOUNTER — Encounter (HOSPITAL_COMMUNITY): Payer: Self-pay | Admitting: *Deleted

## 2014-07-26 DIAGNOSIS — I369 Nonrheumatic tricuspid valve disorder, unspecified: Secondary | ICD-10-CM

## 2014-07-26 HISTORY — PX: TEE WITHOUT CARDIOVERSION: SHX5443

## 2014-07-26 LAB — URINALYSIS, ROUTINE W REFLEX MICROSCOPIC
BILIRUBIN URINE: NEGATIVE
Glucose, UA: 500 mg/dL — AB
Hgb urine dipstick: NEGATIVE
KETONES UR: 15 mg/dL — AB
Leukocytes, UA: NEGATIVE
NITRITE: NEGATIVE
PROTEIN: 100 mg/dL — AB
Specific Gravity, Urine: 1.016 (ref 1.005–1.030)
UROBILINOGEN UA: 1 mg/dL (ref 0.0–1.0)
pH: 7 (ref 5.0–8.0)

## 2014-07-26 LAB — COMPREHENSIVE METABOLIC PANEL
ALT: 12 U/L (ref 0–53)
ANION GAP: 13 (ref 5–15)
AST: 11 U/L (ref 0–37)
Albumin: 2.7 g/dL — ABNORMAL LOW (ref 3.5–5.2)
Alkaline Phosphatase: 75 U/L (ref 39–117)
BUN: 12 mg/dL (ref 6–23)
CHLORIDE: 88 meq/L — AB (ref 96–112)
CO2: 18 meq/L — AB (ref 19–32)
Calcium: 8.1 mg/dL — ABNORMAL LOW (ref 8.4–10.5)
Creatinine, Ser: 0.84 mg/dL (ref 0.50–1.35)
GFR calc Af Amer: >90 mL/min (ref >90)
GFR calc non Af Amer: >90 mL/min (ref >90)
Glucose, Bld: 262 mg/dL — ABNORMAL HIGH (ref 70–99)
Potassium: 4 mEq/L (ref 3.7–5.3)
SODIUM: 119 meq/L — AB (ref 137–147)
Total Bilirubin: 0.8 mg/dL (ref 0.3–1.2)
Total Protein: 7.2 g/dL (ref 6.0–8.3)

## 2014-07-26 LAB — OSMOLALITY, URINE: Osmolality, Ur: 464 mOsm/kg (ref 390–1090)

## 2014-07-26 LAB — SODIUM, URINE, RANDOM: Sodium, Ur: 92 mEq/L

## 2014-07-26 LAB — URINE MICROSCOPIC-ADD ON

## 2014-07-26 LAB — GLUCOSE, CAPILLARY
GLUCOSE-CAPILLARY: 183 mg/dL — AB (ref 70–99)
Glucose-Capillary: 217 mg/dL — ABNORMAL HIGH (ref 70–99)
Glucose-Capillary: 78 mg/dL (ref 70–99)
Glucose-Capillary: 253 mg/dL — ABNORMAL HIGH (ref 70–99)

## 2014-07-26 LAB — PROTEIN / CREATININE RATIO, URINE
Creatinine, Urine: 74.5 mg/dL
Protein Creatinine Ratio: 1.48 — ABNORMAL HIGH (ref 0.00–0.15)
Total Protein, Urine: 109.9 mg/dL

## 2014-07-26 LAB — GC/CHLAMYDIA PROBE AMP
CT PROBE, AMP APTIMA: NEGATIVE
GC Probe RNA: NEGATIVE

## 2014-07-26 SURGERY — ECHOCARDIOGRAM, TRANSESOPHAGEAL
Anesthesia: Moderate Sedation

## 2014-07-26 MED ORDER — MIDAZOLAM HCL 10 MG/2ML IJ SOLN
INTRAMUSCULAR | Status: DC | PRN
  Administered 2014-07-26 (×2): 2 mg via INTRAVENOUS

## 2014-07-26 MED ORDER — INSULIN ASPART PROT & ASPART (70-30 MIX) 100 UNIT/ML ~~LOC~~ SUSP
40.0000 [IU] | Freq: Two times a day (BID) | SUBCUTANEOUS | Status: DC
  Filled 2014-07-26: qty 10

## 2014-07-26 MED ORDER — BUTAMBEN-TETRACAINE-BENZOCAINE 2-2-14 % EX AERO
INHALATION_SPRAY | CUTANEOUS | Status: DC | PRN
Start: 2014-07-26 — End: 2014-07-26
  Administered 2014-07-26: 2 via TOPICAL

## 2014-07-26 MED ORDER — FENTANYL CITRATE 0.05 MG/ML IJ SOLN
INTRAMUSCULAR | Status: AC
  Filled 2014-07-26: qty 2

## 2014-07-26 MED ORDER — SODIUM CHLORIDE 0.9 % IV SOLN
INTRAVENOUS | Status: DC
  Administered 2014-07-26: 500 mL via INTRAVENOUS
  Administered 2014-07-26: 12:00:00 via INTRAVENOUS

## 2014-07-26 MED ORDER — FUROSEMIDE 40 MG PO TABS
40.0000 mg | ORAL_TABLET | Freq: Two times a day (BID) | ORAL | Status: DC
  Administered 2014-07-26 – 2014-08-02 (×14): 40 mg via ORAL
  Filled 2014-07-26 (×14): qty 1

## 2014-07-26 MED ORDER — QUETIAPINE FUMARATE 25 MG PO TABS
25.0000 mg | ORAL_TABLET | Freq: Two times a day (BID) | ORAL | Status: DC
  Administered 2014-07-27 – 2014-08-01 (×11): 25 mg via ORAL
  Filled 2014-07-26 (×11): qty 1

## 2014-07-26 MED ORDER — FENTANYL CITRATE 0.05 MG/ML IJ SOLN
INTRAMUSCULAR | Status: DC | PRN
  Administered 2014-07-26 (×2): 25 ug via INTRAVENOUS

## 2014-07-26 MED ORDER — MIDAZOLAM HCL 5 MG/ML IJ SOLN
INTRAMUSCULAR | Status: AC
  Filled 2014-07-26: qty 2

## 2014-07-26 NOTE — H&P (View-Only) (Signed)
Patient ID: [name], male   DOB: 05/31/1956, 58 y.o.   MRN: [id number] Complaints the same ... Headache neck pain. Continues supportive care. 

## 2014-07-26 NOTE — Progress Notes (Signed)
Patient ID: [name], male   DOB: [birth year], 58 y.o.   MRN: [id number] [facility] MD Progress Note  07/26/2014 2:25 PM [name]  MRN:  [id number]  Subjective:  Patient is seen today for psychiatric consultation follow up. Patient stated that he has better spirits today, rested better yesterday and has mild weakness and tired. He has ongoing symptoms of depression and denied current suicidal ideation, intention or plans. Patient stated that he has been in communication with his family like mother,sister and daughter which makes him feel better.     Diagnosis:   DSM5: Depressive Disorders:  Major Depressive Disorder - with Psychotic Features (296.24) Total Time spent with patient: 30 minutes  Axis I: Generalized Anxiety Disorder, Major Depression, Recurrent severe and Cocaine abuse  ADL's:  Impaired  Sleep: Poor  Appetite:  Poor  Suicidal Ideation:  Endorses suicidal ideation without plan Homicidal Ideation:  Denied AEB (as evidenced by):  Psychiatric Specialty Exam: Physical Exam  ROS  Blood pressure 132/64, pulse 106, temperature 100.4 F (38 C), temperature source Oral, resp. rate 18, height 5' 7" (1.702 m), weight 54.522 kg (120 lb 3.2 oz), SpO2 100.00%.Body mass index is 18.82 kg/(m^2).  General Appearance: Disheveled and Guarded  Eye Contact::  Minimal  Speech:  Clear and Coherent and Slow  Volume:  Decreased  Mood:  Anxious, Depressed and Hopeless  Affect:  Depressed and Flat  Thought Process:  Coherent and Goal Directed  Orientation:  Full (Time, Place, and Person)  Thought Content:  Rumination  Suicidal Thoughts:  No  Homicidal Thoughts:  No  Memory:  Immediate;   Fair Recent;   Fair  Judgement:  Impaired  Insight:  Fair  Psychomotor Activity:  Psychomotor Retardation  Concentration:  Fair  Recall:  [employer] of Knowledge:Fair  Language: Good  Akathisia:  NA  Handed:  Right  AIMS (if indicated):     Assets:  Communication Skills Leisure  Time Resilience Social Support  Sleep:      Musculoskeletal: Strength & Muscle Tone: decreased Gait & Station: unable to stand Patient leans: N/A  Current Medications: Current Facility-Administered Medications  Medication Dose Route Frequency Provider Last Rate Last Dose  . 0.9 %  sodium chloride infusion   Intravenous Continuous Dorothy Spark, MD 20 mL/hr at 07/26/14 1215    . acetaminophen (TYLENOL) tablet 650 mg  650 mg Oral Q6H PRN Reyne Dumas, MD   650 mg at 07/25/14 0151  . amLODipine (NORVASC) tablet 10 mg  10 mg Oral Daily Reyne Dumas, MD   10 mg at 07/26/14 1055  . ceFAZolin (ANCEF) IVPB 2 g/50 mL premix  2 g Intravenous 3 times per day Arman Bogus, RPH   2 g at 07/26/14 0541  . fentaNYL (SUBLIMAZE) injection 25-100 mcg  25-100 mcg Intravenous Q2H PRN Juanito Doom, MD   25 mcg at 07/24/14 2137  . furosemide (LASIX) tablet 40 mg  40 mg Oral BID Windy Kalata, MD      . hydrALAZINE (APRESOLINE) tablet 50 mg  50 mg Oral 3 times per day Reyne Dumas, MD   50 mg at 07/26/14 0541  . insulin aspart (novoLOG) injection 0-20 Units  0-20 Units Subcutaneous TID WC Collene Gobble, MD   7 Units at 07/26/14 0854  . insulin aspart (novoLOG) injection 0-5 Units  0-5 Units Subcutaneous QHS Collene Gobble, MD   2 Units at 07/20/14 2221  . insulin aspart protamine- aspart (NOVOLOG MIX 70/30) injection  40 Units  40 Units Subcutaneous BID WC Reyne Dumas, MD      . ketorolac (TORADOL) 30 MG/ML injection 30 mg  30 mg Intravenous Q6H PRN Reyne Dumas, MD   30 mg at 07/24/14 1955  . labetalol (NORMODYNE,TRANDATE) injection 10 mg  10 mg Intravenous QID PRN Reyne Dumas, MD   10 mg at 07/20/14 1231  . metoCLOPramide (REGLAN) injection 5 mg  5 mg Intravenous Q8H PRN Brand Males, MD   5 mg at 07/25/14 1709  . pantoprazole (PROTONIX) EC tablet 40 mg  40 mg Oral QHS Eudelia Bunch, RPH   40 mg at 07/25/14 2234  . polyethylene glycol (MIRALAX / GLYCOLAX) packet 17 g  17 g Oral  Daily Reyne Dumas, MD   17 g at 07/26/14 1055  . QUEtiapine (SEROQUEL) tablet 25 mg  25 mg Oral TID WC Durward Parcel, MD   25 mg at 07/26/14 0852  . QUEtiapine (SEROQUEL) tablet 50 mg  50 mg Oral QHS Durward Parcel, MD   50 mg at 07/25/14 2233  . tuberculin injection 5 Units  5 Units Intradermal Once Reyne Dumas, MD        Lab Results:  Results for orders placed during the hospital encounter of 07/16/14 (from the past 48 hour(s))  GLUCOSE, CAPILLARY     Status: Abnormal   Collection Time    07/24/14  4:13 PM      Result Value Ref Range   Glucose-Capillary 141 (*) 70 - 99 mg/dL   Comment 1 Notify RN     Comment 2 Documented in Chart    GLUCOSE, CAPILLARY     Status: None   Collection Time    07/24/14  9:05 PM      Result Value Ref Range   Glucose-Capillary 76  70 - 99 mg/dL   Comment 1 Documented in Chart     Comment 2 Notify RN    GC/CHLAMYDIA PROBE AMP     Status: None   Collection Time    07/25/14  4:42 AM      Result Value Ref Range   CT Probe RNA NEGATIVE  NEGATIVE   GC Probe RNA NEGATIVE  NEGATIVE   Comment: (NOTE)                                                                                               **Normal Reference Range: Negative**          Assay performed using the Gen-Probe APTIMA COMBO2 (R) Assay.     Acceptable specimen types for this assay include APTIMA Swabs (Unisex,     endocervical, urethral, or vaginal), first void urine, and ThinPrep     liquid based cytology samples.     Performed at [place], CAPILLARY     Status: Abnormal   Collection Time    07/25/14  6:54 AM      Result Value Ref Range   Glucose-Capillary 239 (*) 70 - 99 mg/dL  CBC     Status: Abnormal   Collection Time    07/25/14  9:44 AM  Result Value Ref Range   WBC 7.9  4.0 - 10.5 K/uL   RBC 4.36  4.22 - 5.81 MIL/uL   Hemoglobin 12.4 (*) 13.0 - 17.0 g/dL   HCT 35.0 (*) 39.0 - 52.0 %   MCV 80.3  78.0 - 100.0 fL   MCH 28.4  26.0 -  34.0 pg   MCHC 35.4  30.0 - 36.0 g/dL   RDW 12.6  11.5 - 15.5 %   Platelets 400  150 - 400 K/uL  TROPONIN I     Status: None   Collection Time    07/25/14 10:00 AM      Result Value Ref Range   Troponin I <0.30  <0.30 ng/mL   Comment:            Due to the release kinetics of cTnI,     a negative result within the first hours     of the onset of symptoms does not rule out     myocardial infarction with certainty.     If myocardial infarction is still suspected,     repeat the test at appropriate intervals.  COMPREHENSIVE METABOLIC PANEL     Status: Abnormal   Collection Time    07/25/14 10:00 AM      Result Value Ref Range   Sodium 125 (*) 137 - 147 mEq/L   Potassium 4.3  3.7 - 5.3 mEq/L   Chloride 91 (*) 96 - 112 mEq/L   CO2 17 (*) 19 - 32 mEq/L   Glucose, Bld 172 (*) 70 - 99 mg/dL   BUN 10  6 - 23 mg/dL   Creatinine, Ser 0.74  0.50 - 1.35 mg/dL   Calcium 8.1 (*) 8.4 - 10.5 mg/dL   Total Protein 7.4  6.0 - 8.3 g/dL   Albumin 2.8 (*) 3.5 - 5.2 g/dL   AST 15  0 - 37 U/L   ALT 13  0 - 53 U/L   Alkaline Phosphatase 78  39 - 117 U/L   Total Bilirubin 0.4  0.3 - 1.2 mg/dL   GFR calc non Af Amer >90  >90 mL/min   GFR calc Af Amer >90  >90 mL/min   Comment: (NOTE)     The eGFR has been calculated using the CKD EPI equation.     This calculation has not been validated in all clinical situations.     eGFR's persistently <90 mL/min signify possible Chronic Kidney     Disease.   Anion gap 17 (*) 5 - 15  GLUCOSE, CAPILLARY     Status: Abnormal   Collection Time    07/25/14 11:42 AM      Result Value Ref Range   Glucose-Capillary 125 (*) 70 - 99 mg/dL   Comment 1 Notify RN     Comment 2 Documented in Chart    GLUCOSE, CAPILLARY     Status: Abnormal   Collection Time    07/25/14  4:38 PM      Result Value Ref Range   Glucose-Capillary 168 (*) 70 - 99 mg/dL   Comment 1 Notify RN     Comment 2 Documented in Chart    GLUCOSE, CAPILLARY     Status: Abnormal   Collection Time     07/25/14  9:45 PM      Result Value Ref Range   Glucose-Capillary 229 (*) 70 - 99 mg/dL   Comment 1 Notify RN     Comment 2 Documented in Chart  GLUCOSE, CAPILLARY     Status: Abnormal   Collection Time    07/26/14  6:55 AM      Result Value Ref Range   Glucose-Capillary 217 (*) 70 - 99 mg/dL   Comment 1 Notify RN     Comment 2 Documented in Chart    COMPREHENSIVE METABOLIC PANEL     Status: Abnormal   Collection Time    07/26/14  8:44 AM      Result Value Ref Range   Sodium 119 (*) 137 - 147 mEq/L   Comment: CRITICAL RESULT CALLED TO, READ BACK BY AND VERIFIED WITH:     Q. COSTAS,RN AT 2947 07/26/14 BY ZBEECH.   Potassium 4.0  3.7 - 5.3 mEq/L   Chloride 88 (*) 96 - 112 mEq/L   CO2 18 (*) 19 - 32 mEq/L   Glucose, Bld 262 (*) 70 - 99 mg/dL   BUN 12  6 - 23 mg/dL   Creatinine, Ser 0.84  0.50 - 1.35 mg/dL   Calcium 8.1 (*) 8.4 - 10.5 mg/dL   Total Protein 7.2  6.0 - 8.3 g/dL   Albumin 2.7 (*) 3.5 - 5.2 g/dL   AST 11  0 - 37 U/L   ALT 12  0 - 53 U/L   Alkaline Phosphatase 75  39 - 117 U/L   Total Bilirubin 0.8  0.3 - 1.2 mg/dL   GFR calc non Af Amer >90  >90 mL/min   GFR calc Af Amer >90  >90 mL/min   Comment: (NOTE)     The eGFR has been calculated using the CKD EPI equation.     This calculation has not been validated in all clinical situations.     eGFR's persistently <90 mL/min signify possible Chronic Kidney     Disease.   Anion gap 13  5 - 15  URINALYSIS, ROUTINE W REFLEX MICROSCOPIC     Status: Abnormal   Collection Time    07/26/14 11:50 AM      Result Value Ref Range   Color, Urine YELLOW  YELLOW   APPearance CLEAR  CLEAR   Specific Gravity, Urine 1.016  1.005 - 1.030   pH 7.0  5.0 - 8.0   Glucose, UA 500 (*) NEGATIVE mg/dL   Hgb urine dipstick NEGATIVE  NEGATIVE   Bilirubin Urine NEGATIVE  NEGATIVE   Ketones, ur 15 (*) NEGATIVE mg/dL   Protein, ur 100 (*) NEGATIVE mg/dL   Urobilinogen, UA 1.0  0.0 - 1.0 mg/dL   Nitrite NEGATIVE  NEGATIVE   Leukocytes, UA  NEGATIVE  NEGATIVE  PROTEIN / CREATININE RATIO, URINE     Status: Abnormal   Collection Time    07/26/14 11:50 AM      Result Value Ref Range   Creatinine, Urine 74.50     Total Protein, Urine 109.9     Comment: NO NORMAL RANGE ESTABLISHED FOR THIS TEST   Protein Creatinine Ratio 1.48 (*) 0.00 - 0.15  SODIUM, URINE, RANDOM     Status: None   Collection Time    07/26/14 11:50 AM      Result Value Ref Range   Sodium, Ur 92    URINE MICROSCOPIC-ADD ON     Status: None   Collection Time    07/26/14 11:50 AM      Result Value Ref Range   Squamous Epithelial / LPF RARE  RARE   WBC, UA 0-2  <3 WBC/hpf   RBC / HPF 0-2  <3 RBC/hpf  GLUCOSE, CAPILLARY     Status: Abnormal   Collection Time    07/26/14 11:56 AM      Result Value Ref Range   Glucose-Capillary 183 (*) 70 - 99 mg/dL    Physical Findings: AIMS:  , ,  ,  ,    CIWA:    COWS:     Treatment Plan Summary: Daily contact with patient to assess and evaluate symptoms and progress in treatment Medication management  Plan: Change Seroquel 25 mg PO BID and 50 mg at bed time for mood and insomnia as he can't use SSRI due to hyponatremia at this time Continue safety sitter as patient continue to be on and off suicidal and recommend in patient hospitalization when medically stable and able to participate in ADL's and therapies. Psych consult will follow up tomorrow.    Medical Decision Making Problem Points:  Established problem, worsening (2), New problem, with no additional work-up planned (3), Review of last therapy session (1) and Review of psycho-social stressors (1) Data Points:  Independent review of image, tracing, or specimen (2) Review or order clinical lab tests (1) Review of medication regiment & side effects (2) Review of new medications or change in dosage (2)  I certify that inpatient services furnished can reasonably be expected to improve the patient's condition.   Merary Garguilo,JANARDHAHA R. 07/26/2014, 2:24  PM

## 2014-07-26 NOTE — Progress Notes (Signed)
Patient ID: [name], male   DOB: 09/07/1956, 58 y.o.   MRN: [id number] Hyponatremia noted. Appreciate help of Dr. Briant CedarMattingly Patient being taken for echocardiogram.

## 2014-07-26 NOTE — CV Procedure (Signed)
    Transesophageal Echocardiogram Note  Coralie KeensLarry B Raval [id number] 10/04/1956  Procedure: Transesophageal Echocardiogram Indications: CVA  Procedure Details Consent: Obtained Time Out: Verified patient identification, verified procedure, site/side was marked, verified correct patient position, special equipment/implants available, Radiology Safety Procedures followed,  medications/allergies/relevent history reviewed, required imaging and test results available.  Performed  Medications: Fentanyl: 50 mcg Versed: 4 mg  No source of embolism was identified, no vegetation, no thrombus and negative bubble study. Mild TR, otherwise normal study.   Complications: No apparent complications Patient did tolerate procedure well.  Lars MassonNELSON, Harneet Noblett H, MD, [facility] 07/26/2014, 8:20 AM

## 2014-07-26 NOTE — Interval H&P Note (Signed)
History and Physical Interval Note:  07/26/2014 8:19 AM  [name]  has presented today for surgery, with the diagnosis of stroke  The various methods of treatment have been discussed with the patient and family. After consideration of risks, benefits and other options for treatment, the patient has consented to  Procedure(s): TRANSESOPHAGEAL ECHOCARDIOGRAM (TEE) (N/A) as a surgical intervention .  The patient's history has been reviewed, patient examined, no change in status, stable for surgery.  I have reviewed the patient's chart and labs.  Questions were answered to the patient's satisfaction.     Lars MassonNELSON, Renie Stelmach H

## 2014-07-26 NOTE — Progress Notes (Signed)
Inpatient Diabetes Program Recommendations  AACE/ADA: New Consensus Statement on Inpatient Glycemic Control (2013)  Target Ranges:  Prepandial:   less than 140 mg/dL      Peak postprandial:   less than 180 mg/dL (1-2 hours)      Critically ill patients:  140 - 180 mg/dL    Inpatient Diabetes Program Recommendations Insulin - Basal: Noted the am 70/30 at 35 units is fairly effective controlling cbg's before lunch but less befoe supper. The pm 70/30 dose definitely needs an increase as HS is high and next am is high, both in 200's.Would Recommend an increase in 70 30 to 40 units ac breakfast  and ac supper Correction (SSI): Please also add the HS correction scale to the presently ordered sensitive tidwc. Insulin - Meal Coverage: discontinue meal coverage if using 70/30  Thank you, Justin CoffinAnn Chika Cichowski, RN, CNS, Diabetes Coordinator [phone])
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
## 2014-07-26 NOTE — Progress Notes (Signed)
S: "I drink a lot of water"   Says salt tabs are making him vomit O:BP 140/66  Pulse 111  Temp(Src) 100.5 F (38.1 C) (Oral)  Resp 20  Ht 5\' 7"  (1.702 m)  Wt 54.522 kg (120 lb 3.2 oz)  BMI 18.82 kg/m2  SpO2 100% No intake or output data in the 24 hours ending 07/26/14 1131 Weight change:  JYN:WGNFAGen:awake and alert CVS:RRR Resp:Clear Abd:+ BS NTND Ext:no edema NEURO: Follows commands   . amLODipine  10 mg Oral Daily  .  ceFAZolin (ANCEF) IV  2 g Intravenous 3 times per day  . furosemide  40 mg Intravenous Daily  . hydrALAZINE  50 mg Oral 3 times per day  . insulin aspart  0-20 Units Subcutaneous TID WC  . insulin aspart  0-5 Units Subcutaneous QHS  . insulin aspart protamine- aspart  40 Units Subcutaneous BID WC  . pantoprazole  40 mg Oral QHS  . polyethylene glycol  17 g Oral Daily  . QUEtiapine  25 mg Oral TID WC  . QUEtiapine  50 mg Oral QHS  . sodium chloride  2 g Oral 6 X Daily  . tuberculin  5 Units Intradermal Once   Dg Chest Port 1 View  07/25/2014   CLINICAL DATA:  Shortness of breath, cough.  EXAM: PORTABLE CHEST - 1 VIEW  COMPARISON:  July 21, 2014.  FINDINGS: The heart size and mediastinal contours are within normal limits. Both lungs are clear. No pneumothorax or pleural effusion is noted. Old left rib fractures are noted.  IMPRESSION: No acute cardiopulmonary abnormality seen.   Electronically Signed   By: Roque LiasJames  Green M.D.   On: 07/25/2014 08:55   BMET    Component Value Date/Time   NA 119* 07/26/2014 0844   K 4.0 07/26/2014 0844   CL 88* 07/26/2014 0844   CO2 18* 07/26/2014 0844   GLUCOSE 262* 07/26/2014 0844   BUN 12 07/26/2014 0844   CREATININE 0.84 07/26/2014 0844   CALCIUM 8.1* 07/26/2014 0844   GFRNONAA >90 07/26/2014 0844   GFRAA >90 07/26/2014 0844   CBC    Component Value Date/Time   WBC 7.9 07/25/2014 0944   RBC 4.36 07/25/2014 0944   HGB 12.4* 07/25/2014 0944   HCT 35.0* 07/25/2014 0944   PLT 400 07/25/2014 0944   MCV 80.3 07/25/2014 0944   MCH 28.4  07/25/2014 0944   MCHC 35.4 07/25/2014 0944   RDW 12.6 07/25/2014 0944   LYMPHSABS 2.4 07/23/2014 1820   MONOABS 0.6 07/23/2014 1820   EOSABS 0.0 07/23/2014 1820   BASOSABS 0.0 07/23/2014 1820     Assessment: 1. 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 SAH 3. HTN 4. Coag neg staph bacteremia 5 Schizophrenia  Plan: 1. Dc Salt tabs 2. Fluid restrict 3. Need accurate I/O's 4. Change lasix to po 5. Recheck urine studies   [name] T

## 2014-07-27 ENCOUNTER — Encounter (HOSPITAL_COMMUNITY): Payer: Self-pay | Admitting: Cardiology

## 2014-07-27 DIAGNOSIS — R7881 Bacteremia: Secondary | ICD-10-CM | POA: Diagnosis present

## 2014-07-27 DIAGNOSIS — E871 Hypo-osmolality and hyponatremia: Secondary | ICD-10-CM | POA: Diagnosis present

## 2014-07-27 DIAGNOSIS — G8929 Other chronic pain: Secondary | ICD-10-CM

## 2014-07-27 DIAGNOSIS — R079 Chest pain, unspecified: Secondary | ICD-10-CM

## 2014-07-27 DIAGNOSIS — J9600 Acute respiratory failure, unspecified whether with hypoxia or hypercapnia: Secondary | ICD-10-CM

## 2014-07-27 DIAGNOSIS — R440 Auditory hallucinations: Secondary | ICD-10-CM

## 2014-07-27 LAB — GLUCOSE, CAPILLARY
GLUCOSE-CAPILLARY: 198 mg/dL — AB (ref 70–99)
Glucose-Capillary: 414 mg/dL — ABNORMAL HIGH (ref 70–99)
Glucose-Capillary: 214 mg/dL — ABNORMAL HIGH (ref 70–99)
Glucose-Capillary: 210 mg/dL — ABNORMAL HIGH (ref 70–99)
Glucose-Capillary: 339 mg/dL — ABNORMAL HIGH (ref 70–99)

## 2014-07-27 LAB — COMPREHENSIVE METABOLIC PANEL
ALT: 10 U/L (ref 0–53)
AST: 13 U/L (ref 0–37)
Albumin: 2.6 g/dL — ABNORMAL LOW (ref 3.5–5.2)
Alkaline Phosphatase: 70 U/L (ref 39–117)
Anion gap: 18 — ABNORMAL HIGH (ref 5–15)
BILIRUBIN TOTAL: 0.3 mg/dL (ref 0.3–1.2)
BUN: 17 mg/dL (ref 6–23)
CHLORIDE: 88 meq/L — AB (ref 96–112)
CO2: 17 meq/L — AB (ref 19–32)
Calcium: 7.8 mg/dL — ABNORMAL LOW (ref 8.4–10.5)
Creatinine, Ser: 0.88 mg/dL (ref 0.50–1.35)
GFR calc Af Amer: >90 mL/min (ref >90)
GFR calc non Af Amer: >90 mL/min (ref >90)
Glucose, Bld: 229 mg/dL — ABNORMAL HIGH (ref 70–99)
Potassium: 4.2 mEq/L (ref 3.7–5.3)
Sodium: 123 mEq/L — ABNORMAL LOW (ref 137–147)
Total Protein: 6.9 g/dL (ref 6.0–8.3)

## 2014-07-27 MED ORDER — ADULT MULTIVITAMIN W/MINERALS CH
1.0000 | ORAL_TABLET | Freq: Every day | ORAL | Status: DC
Start: 2014-07-27 — End: 2014-08-02
  Administered 2014-07-27 – 2014-08-02 (×7): 1 via ORAL
  Filled 2014-07-27 (×7): qty 1

## 2014-07-27 MED ORDER — INSULIN ASPART 100 UNIT/ML ~~LOC~~ SOLN: 3.0000 [IU] | Freq: Three times a day (TID) | SUBCUTANEOUS | Status: DC

## 2014-07-27 MED ORDER — INSULIN ASPART PROT & ASPART (70-30 MIX) 100 UNIT/ML ~~LOC~~ SUSP
43.0000 [IU] | Freq: Two times a day (BID) | SUBCUTANEOUS | Status: DC
  Administered 2014-07-27 – 2014-07-28 (×2): 43 [IU] via SUBCUTANEOUS
  Filled 2014-07-27: qty 10

## 2014-07-27 MED ORDER — INSULIN ASPART 100 UNIT/ML ~~LOC~~ SOLN: 10.0000 [IU] | Freq: Once | SUBCUTANEOUS | Status: DC

## 2014-07-27 MED ORDER — GLUCERNA SHAKE PO LIQD
237.0000 mL | ORAL | Status: DC
  Administered 2014-07-28 – 2014-07-30 (×3): 237 mL via ORAL

## 2014-07-27 NOTE — Progress Notes (Signed)
Echocardiogram Echocardiogram Transesophageal has been performed.  Dorothey BasemanReel, Nyeli Holtmeyer M 07/27/2014, 4:45 PM

## 2014-07-27 NOTE — Progress Notes (Signed)
S:  Feels well. No new CO O:BP 134/63  Pulse 106  Temp(Src) 100.6 F (38.1 C) (Oral)  Resp 20  Ht 5\' 7"  (1.702 m)  Wt 54.522 kg (120 lb 3.2 oz)  BMI 18.82 kg/m2  SpO2 98%  Intake/Output Summary (Last 24 hours) at 07/27/14 1101 Last data filed at 07/27/14 0100  Gross per 24 hour  Intake    240 ml  Output   1775 ml  Net  -1535 ml   Weight change: -4.278 kg (-9 lb 6.9 oz) ZOX:WRUEAGen:awake and alert CVS:RRR Resp:Clear Abd:+ BS NTND Ext:no edema NEURO: CNI Ox3   . amLODipine  10 mg Oral Daily  .  ceFAZolin (ANCEF) IV  2 g Intravenous 3 times per day  . furosemide  40 mg Oral BID  . hydrALAZINE  50 mg Oral 3 times per day  . insulin aspart  0-20 Units Subcutaneous TID WC  . insulin aspart  0-5 Units Subcutaneous QHS  . insulin aspart protamine- aspart  40 Units Subcutaneous BID WC  . pantoprazole  40 mg Oral QHS  . polyethylene glycol  17 g Oral Daily  . QUEtiapine  25 mg Oral BID  . QUEtiapine  50 mg Oral QHS  . tuberculin  5 Units Intradermal Once   No results found. BMET    Component Value Date/Time   NA 123* 07/27/2014 0925   K 4.2 07/27/2014 0925   CL 88* 07/27/2014 0925   CO2 17* 07/27/2014 0925   GLUCOSE 229* 07/27/2014 0925   BUN 17 07/27/2014 0925   CREATININE 0.88 07/27/2014 0925   CALCIUM 7.8* 07/27/2014 0925   GFRNONAA >90 07/27/2014 0925   GFRAA >90 07/27/2014 0925   CBC    Component Value Date/Time   WBC 7.9 07/25/2014 0944   RBC 4.36 07/25/2014 0944   HGB 12.4* 07/25/2014 0944   HCT 35.0* 07/25/2014 0944   PLT 400 07/25/2014 0944   MCV 80.3 07/25/2014 0944   MCH 28.4 07/25/2014 0944   MCHC 35.4 07/25/2014 0944   RDW 12.6 07/25/2014 0944   LYMPHSABS 2.4 07/23/2014 1820   MONOABS 0.6 07/23/2014 1820   EOSABS 0.0 07/23/2014 1820   BASOSABS 0.0 07/23/2014 1820     Assessment: 1. 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 SAH 3. HTN 4. Coag neg staph bacteremia 5 Schizophrenia  Plan: 1. Cont with PO lasix and fluid restriction 2. Recheck SNa  in AM   Jordynn Perrier T

## 2014-07-27 NOTE — Progress Notes (Signed)
Patient ID: [name], male   DOB: 1955-11-14, 58 y.o.   MRN: [id number] Patient ID: [name], male   DOB: 1955/10/31, 58 y.o.   MRN: [id number] [facility] MD Progress Note  07/27/2014 2:56 PM BYAN POPLASKI  MRN:  [id number]  Subjective:  Patient is seen today for psychiatric consultation follow up. Patient appeared sitting in a chair next to his bed and his mother is visiting him. Patient stated that he has been taking the medication as given, and feels that he is sleeping well and feeling much better with his current medication treatment. He has been working with physical therapist and stated that he is able to walk only up to two rooms down and needs more strength and still hoping to be placed in [place], [place], close to his mother and sister. He has mild symptoms of depression and denied current suicidal ideation, intention or plans. He has no evidence of psychotic features.    Diagnosis:   DSM5: Depressive Disorders:  Major Depressive Disorder - with Psychotic Features (296.24) Total Time spent with patient: 30 minutes  Axis I: Generalized Anxiety Disorder, Major Depression, Recurrent severe and Cocaine abuse  ADL's:  Impaired  Sleep: Poor  Appetite:  Poor  Suicidal Ideation:  Endorses suicidal ideation without plan Homicidal Ideation:  Denied AEB (as evidenced by):  Psychiatric Specialty Exam: Physical Exam  ROS  Blood pressure 106/55, pulse 93, temperature 100.6 F (38.1 C), temperature source Oral, resp. rate 20, height '5\' 7"'  (1.702 m), weight 54.522 kg (120 lb 3.2 oz), SpO2 100.00%.Body mass index is 18.82 kg/(m^2).  General Appearance: Disheveled and Guarded  Eye Contact::  Minimal  Speech:  Clear and Coherent and Slow  Volume:  Decreased  Mood:  Anxious, Depressed and Hopeless  Affect:  Depressed and Flat  Thought Process:  Coherent and Goal Directed  Orientation:  Full (Time, Place, and Person)  Thought Content:  Rumination  Suicidal Thoughts:  No  Homicidal  Thoughts:  No  Memory:  Immediate;   Fair Recent;   Fair  Judgement:  Impaired  Insight:  Fair  Psychomotor Activity:  Psychomotor Retardation  Concentration:  Fair  Recall:  [employer] of Knowledge:Fair  Language: Good  Akathisia:  NA  Handed:  Right  AIMS (if indicated):     Assets:  Communication Skills Leisure Time Resilience Social Support  Sleep:      Musculoskeletal: Strength & Muscle Tone: decreased Gait & Station: unable to stand Patient leans: N/A  Current Medications: Current Facility-Administered Medications  Medication Dose Route Frequency Provider Last Rate Last Dose  . 0.9 %  sodium chloride infusion   Intravenous Continuous Dorothy Spark, MD 20 mL/hr at 07/26/14 1445 500 mL at 07/26/14 1445  . acetaminophen (TYLENOL) tablet 650 mg  650 mg Oral Q6H PRN Reyne Dumas, MD   650 mg at 07/27/14 1013  . amLODipine (NORVASC) tablet 10 mg  10 mg Oral Daily Reyne Dumas, MD   10 mg at 07/27/14 0823  . ceFAZolin (ANCEF) IVPB 2 g/50 mL premix  2 g Intravenous 3 times per day Arman Bogus, RPH   2 g at 07/27/14 1318  . fentaNYL (SUBLIMAZE) injection 25-100 mcg  25-100 mcg Intravenous Q2H PRN Juanito Doom, MD   25 mcg at 07/24/14 2137  . furosemide (LASIX) tablet 40 mg  40 mg Oral BID Windy Kalata, MD   40 mg at 07/27/14 0824  . hydrALAZINE (APRESOLINE) tablet 50 mg  50 mg  Oral 3 times per day Reyne Dumas, MD   50 mg at 07/27/14 1318  . insulin aspart (novoLOG) injection 0-20 Units  0-20 Units Subcutaneous TID WC Collene Gobble, MD   20 Units at 07/27/14 1228  . insulin aspart (novoLOG) injection 0-5 Units  0-5 Units Subcutaneous QHS Collene Gobble, MD   3 Units at 07/26/14 2215  . insulin aspart (novoLOG) injection 10 Units  10 Units Subcutaneous Once Ripudeep K Rai, MD      . insulin aspart protamine- aspart (NOVOLOG MIX 70/30) injection 43 Units  43 Units Subcutaneous BID WC Ripudeep K Rai, MD      . ketorolac (TORADOL) 30 MG/ML injection 30 mg  30  mg Intravenous Q6H PRN Reyne Dumas, MD   30 mg at 07/27/14 1013  . labetalol (NORMODYNE,TRANDATE) injection 10 mg  10 mg Intravenous QID PRN Reyne Dumas, MD   10 mg at 07/20/14 1231  . metoCLOPramide (REGLAN) injection 5 mg  5 mg Intravenous Q8H PRN Brand Males, MD   5 mg at 07/25/14 1709  . pantoprazole (PROTONIX) EC tablet 40 mg  40 mg Oral QHS Eudelia Bunch, RPH   40 mg at 07/26/14 2206  . polyethylene glycol (MIRALAX / GLYCOLAX) packet 17 g  17 g Oral Daily Reyne Dumas, MD   17 g at 07/27/14 0829  . QUEtiapine (SEROQUEL) tablet 25 mg  25 mg Oral BID Durward Parcel, MD   25 mg at 07/27/14 0824  . QUEtiapine (SEROQUEL) tablet 50 mg  50 mg Oral QHS Durward Parcel, MD   50 mg at 07/26/14 2206  . tuberculin injection 5 Units  5 Units Intradermal Once Reyne Dumas, MD        Lab Results:  Results for orders placed during the hospital encounter of 07/16/14 (from the past 48 hour(s))  GLUCOSE, CAPILLARY     Status: Abnormal   Collection Time    07/25/14  4:38 PM      Result Value Ref Range   Glucose-Capillary 168 (*) 70 - 99 mg/dL   Comment 1 Notify RN     Comment 2 Documented in Chart    GLUCOSE, CAPILLARY     Status: Abnormal   Collection Time    07/25/14  9:45 PM      Result Value Ref Range   Glucose-Capillary 229 (*) 70 - 99 mg/dL   Comment 1 Notify RN     Comment 2 Documented in Chart    GLUCOSE, CAPILLARY     Status: Abnormal   Collection Time    07/26/14  6:55 AM      Result Value Ref Range   Glucose-Capillary 217 (*) 70 - 99 mg/dL   Comment 1 Notify RN     Comment 2 Documented in Chart    COMPREHENSIVE METABOLIC PANEL     Status: Abnormal   Collection Time    07/26/14  8:44 AM      Result Value Ref Range   Sodium 119 (*) 137 - 147 mEq/L   Comment: CRITICAL RESULT CALLED TO, READ BACK BY AND VERIFIED WITH:     Q. COSTAS,RN AT 8850 07/26/14 BY ZBEECH.   Potassium 4.0  3.7 - 5.3 mEq/L   Chloride 88 (*) 96 - 112 mEq/L   CO2 18 (*) 19 - 32 mEq/L    Glucose, Bld 262 (*) 70 - 99 mg/dL   BUN 12  6 - 23 mg/dL   Creatinine, Ser 0.84  0.50 -  1.35 mg/dL   Calcium 8.1 (*) 8.4 - 10.5 mg/dL   Total Protein 7.2  6.0 - 8.3 g/dL   Albumin 2.7 (*) 3.5 - 5.2 g/dL   AST 11  0 - 37 U/L   ALT 12  0 - 53 U/L   Alkaline Phosphatase 75  39 - 117 U/L   Total Bilirubin 0.8  0.3 - 1.2 mg/dL   GFR calc non Af Amer >90  >90 mL/min   GFR calc Af Amer >90  >90 mL/min   Comment: (NOTE)     The eGFR has been calculated using the CKD EPI equation.     This calculation has not been validated in all clinical situations.     eGFR's persistently <90 mL/min signify possible Chronic Kidney     Disease.   Anion gap 13  5 - 15  URINALYSIS, ROUTINE W REFLEX MICROSCOPIC     Status: Abnormal   Collection Time    07/26/14 11:50 AM      Result Value Ref Range   Color, Urine YELLOW  YELLOW   APPearance CLEAR  CLEAR   Specific Gravity, Urine 1.016  1.005 - 1.030   pH 7.0  5.0 - 8.0   Glucose, UA 500 (*) NEGATIVE mg/dL   Hgb urine dipstick NEGATIVE  NEGATIVE   Bilirubin Urine NEGATIVE  NEGATIVE   Ketones, ur 15 (*) NEGATIVE mg/dL   Protein, ur 100 (*) NEGATIVE mg/dL   Urobilinogen, UA 1.0  0.0 - 1.0 mg/dL   Nitrite NEGATIVE  NEGATIVE   Leukocytes, UA NEGATIVE  NEGATIVE  PROTEIN / CREATININE RATIO, URINE     Status: Abnormal   Collection Time    07/26/14 11:50 AM      Result Value Ref Range   Creatinine, Urine 74.50     Total Protein, Urine 109.9     Comment: NO NORMAL RANGE ESTABLISHED FOR THIS TEST   Protein Creatinine Ratio 1.48 (*) 0.00 - 0.15  SODIUM, URINE, RANDOM     Status: None   Collection Time    07/26/14 11:50 AM      Result Value Ref Range   Sodium, Ur 92    OSMOLALITY, URINE     Status: None   Collection Time    07/26/14 11:50 AM      Result Value Ref Range   Osmolality, Ur 464  390 - 1090 mOsm/kg   Comment: Performed at [place] [facility]     Status: None   Collection Time    07/26/14 11:50 AM      Result  Value Ref Range   Squamous Epithelial / LPF RARE  RARE   WBC, UA 0-2  <3 WBC/hpf   RBC / HPF 0-2  <3 RBC/hpf  GLUCOSE, CAPILLARY     Status: Abnormal   Collection Time    07/26/14 11:56 AM      Result Value Ref Range   Glucose-Capillary 183 (*) 70 - 99 mg/dL  GLUCOSE, CAPILLARY     Status: None   Collection Time    07/26/14  4:48 PM      Result Value Ref Range   Glucose-Capillary 78  70 - 99 mg/dL   Comment 1 Notify RN    GLUCOSE, CAPILLARY     Status: Abnormal   Collection Time    07/26/14  9:57 PM      Result Value Ref Range   Glucose-Capillary 253 (*) 70 - 99 mg/dL   Comment 1  Notify RN    GLUCOSE, CAPILLARY     Status: Abnormal   Collection Time    07/27/14  6:21 AM      Result Value Ref Range   Glucose-Capillary 198 (*) 70 - 99 mg/dL   Comment 1 Notify RN     Comment 2 Documented in Chart    COMPREHENSIVE METABOLIC PANEL     Status: Abnormal   Collection Time    07/27/14  9:25 AM      Result Value Ref Range   Sodium 123 (*) 137 - 147 mEq/L   Potassium 4.2  3.7 - 5.3 mEq/L   Chloride 88 (*) 96 - 112 mEq/L   CO2 17 (*) 19 - 32 mEq/L   Glucose, Bld 229 (*) 70 - 99 mg/dL   BUN 17  6 - 23 mg/dL   Creatinine, Ser 0.88  0.50 - 1.35 mg/dL   Calcium 7.8 (*) 8.4 - 10.5 mg/dL   Total Protein 6.9  6.0 - 8.3 g/dL   Albumin 2.6 (*) 3.5 - 5.2 g/dL   AST 13  0 - 37 U/L   ALT 10  0 - 53 U/L   Alkaline Phosphatase 70  39 - 117 U/L   Total Bilirubin 0.3  0.3 - 1.2 mg/dL   GFR calc non Af Amer >90  >90 mL/min   GFR calc Af Amer >90  >90 mL/min   Comment: (NOTE)     The eGFR has been calculated using the CKD EPI equation.     This calculation has not been validated in all clinical situations.     eGFR's persistently <90 mL/min signify possible Chronic Kidney     Disease.   Anion gap 18 (*) 5 - 15  GLUCOSE, CAPILLARY     Status: Abnormal   Collection Time    07/27/14 12:05 PM      Result Value Ref Range   Glucose-Capillary 414 (*) 70 - 99 mg/dL   Comment 1 Notify RN       Physical Findings: AIMS:  , ,  ,  ,    CIWA:    COWS:     Treatment Plan Summary: Daily contact with patient to assess and evaluate symptoms and progress in treatment Medication management  Plan: Continue Seroquel 25 mg PO BID and 50 mg at bed time for mood and insomnia as he can't use SSRI due to hyponatremia at this time Continue safety sitter as patient continue to be on and off suicidal and recommend in patient hospitalization when medically stable and able to participate in ADL's and therapies. Psych consult will follow up tomorrow.    Medical Decision Making Problem Points:  Established problem, worsening (2), New problem, with no additional work-up planned (3), Review of last therapy session (1) and Review of psycho-social stressors (1) Data Points:  Independent review of image, tracing, or specimen (2) Review or order clinical lab tests (1) Review of medication regiment & side effects (2) Review of new medications or change in dosage (2)  I certify that inpatient services furnished can reasonably be expected to improve the patient's condition.   Lebert Lovern,JANARDHAHA R. 07/27/2014, 2:56 PM

## 2014-07-27 NOTE — Progress Notes (Signed)
[facility]  Date of Admission:  07/16/2014  Antibiotics: Cefazolin   Subjective: No acute events, in good spirits and greatful for care  Objective: Temp:  [97.6 F (36.4 C)-102.3 F (39.1 C)] 100.6 F (38.1 C) (10/07 0930) Pulse Rate:  [91-112] 106 (10/07 0930) Resp:  [15-30] 20 (10/07 0930) BP: (109-159)/(44-73) 134/63 mmHg (10/07 0930) SpO2:  [96 %-100 %] 98 % (10/07 0930) Weight:  [120 lb 3.2 oz (54.522 kg)] 120 lb 3.2 oz (54.522 kg) (10/07 0500)   Gen: awake, alert, nad CV: RRR Lungs: CTA  Lab Results Lab Results  Component Value Date   WBC 7.9 07/25/2014   HGB 12.4* 07/25/2014   HCT 35.0* 07/25/2014   MCV 80.3 07/25/2014   PLT 400 07/25/2014    Lab Results  Component Value Date   CREATININE 0.88 07/27/2014   BUN 17 07/27/2014   NA 123* 07/27/2014   K 4.2 07/27/2014   CL 88* 07/27/2014   CO2 17* 07/27/2014    Lab Results  Component Value Date   ALT 10 07/27/2014   AST 13 07/27/2014   ALKPHOS 70 07/27/2014   BILITOT 0.3 07/27/2014      Microbiology: Recent Results (from the past 240 hour(s))  CULTURE, BLOOD (ROUTINE X 2)     Status: None   Collection Time    07/22/14  1:26 PM      Result Value Ref Range Status   Specimen Description BLOOD RIGHT ARM   Final   Special Requests BOTTLES DRAWN AEROBIC AND ANAEROBIC 10CC   Final   Culture  Setup Time     Final   Value: 07/22/2014 17:54     Performed at [facility]   Culture     Final   Value: STAPHYLOCOCCUS SPECIES (COAGULASE NEGATIVE)     Note: SUSCEPTIBILITIES PERFORMED ON PREVIOUS CULTURE WITHIN THE LAST 5 DAYS.     Note: Gram Stain Report Called to,Read Back By and Verified With: ANNA MARIA VASIADIAS 07/23/14 @ 456PM BY RUSCOE A.     Performed at [facility]   Report Status 07/25/2014 FINAL   Final  CULTURE, BLOOD (ROUTINE X 2)     Status: None   Collection Time    07/22/14  1:35 PM      Result Value Ref Range Status   Specimen Description BLOOD LEFT ARM    Final   Special Requests BOTTLES DRAWN AEROBIC ONLY 10CC   Final   Culture  Setup Time     Final   Value: 07/22/2014 17:54     Performed at [facility]   Culture     Final   Value: STAPHYLOCOCCUS SPECIES (COAGULASE NEGATIVE)     Note: RIFAMPIN AND GENTAMICIN SHOULD NOT BE USED AS SINGLE DRUGS FOR TREATMENT OF STAPH INFECTIONS. This organism DOES NOT demonstrate inducible Clindamycin resistance in vitro.     Note: Gram Stain Report Called to,Read Back By and Verified With: ANNA MARIA VASIADIAS 07/23/14 @ 1231PM BY RUSCOE A.     Performed at [facility]   Report Status 07/25/2014 FINAL   Final   Organism ID, Bacteria STAPHYLOCOCCUS SPECIES (COAGULASE NEGATIVE)   Final  CULTURE, BLOOD (ROUTINE X 2)     Status: None   Collection Time    07/23/14  6:17 PM      Result Value Ref Range Status   Specimen Description BLOOD RIGHT ARM   Final   Special Requests BOTTLES DRAWN AEROBIC ONLY 6  CC   Final   Culture  Setup Time     Final   Value: 07/24/2014 00:48     [facility]formed [facility]   Culture     Final   Value:        BLOOD CULTURE RECEIVED NO GROWTH TO DATE CULTURE WILL BE HELD FOR 5 DAYS BEFORE ISSUING A FINAL NEGATIVE REPORT     Performed at [facility]   Report Status PENDING   Incomplete  CULTURE, BLOOD (ROUTINE X 2)     Status: None   Collection Time    07/23/14  6:20 PM      Result Value Ref Range Status   Specimen Description BLOOD LEFT ARM   Final   Special Requests BOTTLES DRAWN AEROBIC AND ANAEROBIC 10 CC   Final   Culture  Setup Time     Final   Value: 07/24/2014 00:48     Performed at [facility]   Culture     Final   Value:        BLOOD CULTURE RECEIVED NO GROWTH TO DATE CULTURE WILL BE HELD FOR 5 DAYS BEFORE ISSUING A FINAL NEGATIVE REPORT     Performed at [facility]   Report Status PENDING   Incomplete  GC/CHLAMYDIA PROBE AMP     Status: None   Collection Time    07/25/14  4:42 AM      Result Value Ref Range Status     CT Probe RNA NEGATIVE  NEGATIVE Final   GC Probe RNA NEGATIVE  NEGATIVE Final   Comment: (NOTE)                                                                                               **Normal Reference Range: Negative**          Assay performed using the Gen-Probe APTIMA COMBO2 (R) Assay.     Acceptable specimen types for this assay include APTIMA Swabs (Unisex,     endocervical, urethral, or vaginal), first void urine, and ThinPrep     liquid based cytology samples.     Performed at [facility]    Studies/Results: No results found.  Assessment/Plan: 1) CoNS bacteremia - TEE is negative for vegetation.  On cefazolin for MSSE.  Repeat cultures ngtd.  Can complete 10 days of antibiotics through Oct 12th.  Continue with cefazolin while in house and can change to cipro 500 mg po bid to complete course at time of discharge.    I will sign off, please call with questions  Staci RighterOMER, Hilberto Burzynski, MD [facility] [email or web address] [phone] pager   [phone] cell 07/27/2014, 10:32 AM

## 2014-07-27 NOTE — Progress Notes (Signed)
Patient ordered only oranges and jelly from menu. He has family bringing food form home. Writer noted patient ate 1/2 of large bag of popcorn, one banana, and approximately 30 percent of food from [place] (2 [place] boxes). Patient stated that he will continue to eat the rest for later.  Reviewed carb/mod diet with patient and family. Pt/family verbalized understanding.    Sim BoastHavy, RN

## 2014-07-27 NOTE — Progress Notes (Signed)
Lab called to drawn BMP this AM. Technician is on their way. Will continue to monitor.  Sim BoastHavy, RN
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
## 2014-07-27 NOTE — Progress Notes (Signed)
07/27/14 1437  Mobility  Activity Ambulate in hall;Ambulate in room;Bathroom privileges;Chair  Level of Assistance Minimal assist, patient does 75% or more  Assistive Device Front wheel walker  Ambulation Response Tolerated fair  Bed Position Chair  Range of Motion Active;All extremities    Pt. Ambulated in hallway, he complained of dizziness and had to sit down mid walk.  Vitals and sugar were stable.
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
## 2014-07-27 NOTE — Progress Notes (Signed)
Patient did not eat breakfast this AM and requested to have 2 oranges and jello.Request received. He refused his schedule insulin. BG elevated at lunch time at 414. Patient appears in no distress. MD aware orders received. Only given SSI per Dr. Isidoro Donningai. Diet changed. Will continue to monitor.   Sim BoastHavy, RN

## 2014-07-28 DIAGNOSIS — E1165 Type 2 diabetes mellitus with hyperglycemia: Secondary | ICD-10-CM

## 2014-07-28 DIAGNOSIS — E43 Unspecified severe protein-calorie malnutrition: Secondary | ICD-10-CM | POA: Insufficient documentation

## 2014-07-28 LAB — GLUCOSE, CAPILLARY
GLUCOSE-CAPILLARY: 344 mg/dL — AB (ref 70–99)
Glucose-Capillary: 239 mg/dL — ABNORMAL HIGH (ref 70–99)
Glucose-Capillary: 178 mg/dL — ABNORMAL HIGH (ref 70–99)
Glucose-Capillary: 194 mg/dL — ABNORMAL HIGH (ref 70–99)

## 2014-07-28 LAB — BASIC METABOLIC PANEL
Anion gap: 14 (ref 5–15)
BUN: 13 mg/dL (ref 6–23)
CHLORIDE: 93 meq/L — AB (ref 96–112)
CO2: 19 mEq/L (ref 19–32)
Calcium: 8.1 mg/dL — ABNORMAL LOW (ref 8.4–10.5)
Creatinine, Ser: 0.78 mg/dL (ref 0.50–1.35)
GFR calc non Af Amer: >90 mL/min (ref >90)
GLUCOSE: 182 mg/dL — AB (ref 70–99)
Potassium: 3.6 mEq/L — ABNORMAL LOW (ref 3.7–5.3)
Sodium: 126 mEq/L — ABNORMAL LOW (ref 137–147)

## 2014-07-28 MED ORDER — INSULIN ASPART PROT & ASPART (70-30 MIX) 100 UNIT/ML ~~LOC~~ SUSP
45.0000 [IU] | Freq: Two times a day (BID) | SUBCUTANEOUS | Status: DC
  Administered 2014-07-28 – 2014-07-30 (×5): 45 [IU] via SUBCUTANEOUS
  Filled 2014-07-28: qty 10

## 2014-07-28 MED ORDER — SENNOSIDES-DOCUSATE SODIUM 8.6-50 MG PO TABS
2.0000 | ORAL_TABLET | Freq: Every day | ORAL | Status: DC
  Administered 2014-07-28 – 2014-07-31 (×4): 2 via ORAL
  Filled 2014-07-28 (×4): qty 2

## 2014-07-28 NOTE — Progress Notes (Signed)
S:  Ambulating some but feels his balance is off O:BP 132/68  Pulse 92  Temp(Src) 99.4 F (37.4 C) (Oral)  Resp 20  Ht 5\' 7"  (1.702 m)  Wt 54.477 kg (120 lb 1.6 oz)  BMI 18.81 kg/m2  SpO2 100%  Intake/Output Summary (Last 24 hours) at 07/28/14 1027 Last data filed at 07/28/14 0300  Gross per 24 hour  Intake    890 ml  Output    800 ml  Net     90 ml   Weight change: -0.045 kg (-1.6 oz) ZOX:WRUEAGen:awake and alert CVS:RRR Resp:Clear Abd:+ BS NTND Ext:no edema NEURO: CNI Ox3   . amLODipine  10 mg Oral Daily  .  ceFAZolin (ANCEF) IV  2 g Intravenous 3 times per day  . feeding supplement (GLUCERNA SHAKE)  237 mL Oral Q24H  . furosemide  40 mg Oral BID  . hydrALAZINE  50 mg Oral 3 times per day  . insulin aspart  0-20 Units Subcutaneous TID WC  . insulin aspart  0-5 Units Subcutaneous QHS  . insulin aspart  10 Units Subcutaneous Once  . insulin aspart protamine- aspart  43 Units Subcutaneous BID WC  . multivitamin with minerals  1 tablet Oral Daily  . pantoprazole  40 mg Oral QHS  . polyethylene glycol  17 g Oral Daily  . QUEtiapine  25 mg Oral BID  . QUEtiapine  50 mg Oral QHS  . tuberculin  5 Units Intradermal Once   No results found. BMET    Component Value Date/Time   NA 126* 07/28/2014 0905   K 3.6* 07/28/2014 0905   CL 93* 07/28/2014 0905   CO2 19 07/28/2014 0905   GLUCOSE 182* 07/28/2014 0905   BUN 13 07/28/2014 0905   CREATININE 0.78 07/28/2014 0905   CALCIUM 8.1* 07/28/2014 0905   GFRNONAA >90 07/28/2014 0905   GFRAA >90 07/28/2014 0905   CBC    Component Value Date/Time   WBC 7.9 07/25/2014 0944   RBC 4.36 07/25/2014 0944   HGB 12.4* 07/25/2014 0944   HCT 35.0* 07/25/2014 0944   PLT 400 07/25/2014 0944   MCV 80.3 07/25/2014 0944   MCH 28.4 07/25/2014 0944   MCHC 35.4 07/25/2014 0944   RDW 12.6 07/25/2014 0944   LYMPHSABS 2.4 07/23/2014 1820   MONOABS 0.6 07/23/2014 1820   EOSABS 0.0 07/23/2014 1820   BASOSABS 0.0 07/23/2014 1820     Assessment: 1. Hyponatremia most  likely secondary to SIADH, SNa cont to improve 2. SAH 3. HTN 4. Coag neg staph bacteremia 5 Schizophrenia  Plan: 1. Cont with PO lasix and fluid restriction 2. Recheck SNa in AM   Tamaiya Bump T

## 2014-07-28 NOTE — Clinical Social Work Psych Note (Signed)
Psych CSW following pt for assistance with disposition pending psychiatric status once medically stable.  Psych CSW has consulted with psychiatrist re: disposition and psychiatrist is agreeable to continue to follow and monitor patient's progress.  Per report, pt more steady with balance today though not medically stable.  Psych CSW will continue to collaborate with unit CSW and psychiatrist regarding disposition and pt needs.  Vickii PennaGina Brixton Franko, LCSWA [phone]  Psychiatric & Orthopedics (5N 1-16) Clinical Social Worker

## 2014-07-28 NOTE — Progress Notes (Signed)
Patient ID: Justin KeensLarry B Delacruz, male   DOB: 05/13/1956, 58 y.o.   MRN: [id number] Neuro status remained stable. Level of consciousness slightly improved with return of more normal sodium. Please reconsult neurosurgery if any acute need identified.
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
## 2014-07-29 ENCOUNTER — Inpatient Hospital Stay (HOSPITAL_COMMUNITY): Payer: Medicaid Other

## 2014-07-29 DIAGNOSIS — R1084 Generalized abdominal pain: Secondary | ICD-10-CM

## 2014-07-29 LAB — URINALYSIS, ROUTINE W REFLEX MICROSCOPIC
Bilirubin Urine: NEGATIVE
GLUCOSE, UA: 250 mg/dL — AB
HGB URINE DIPSTICK: NEGATIVE
Ketones, ur: NEGATIVE mg/dL
Leukocytes, UA: NEGATIVE
Nitrite: NEGATIVE
PROTEIN: 100 mg/dL — AB
Specific Gravity, Urine: 1.01 (ref 1.005–1.030)
Urobilinogen, UA: 1 mg/dL (ref 0.0–1.0)
pH: 7 (ref 5.0–8.0)

## 2014-07-29 LAB — BASIC METABOLIC PANEL
Anion gap: 16 — ABNORMAL HIGH (ref 5–15)
BUN: 9 mg/dL (ref 6–23)
CALCIUM: 8.3 mg/dL — AB (ref 8.4–10.5)
CO2: 20 mEq/L (ref 19–32)
CREATININE: 0.73 mg/dL (ref 0.50–1.35)
Chloride: 86 mEq/L — ABNORMAL LOW (ref 96–112)
GFR calc non Af Amer: >90 mL/min (ref >90)
Glucose, Bld: 243 mg/dL — ABNORMAL HIGH (ref 70–99)
Potassium: 3.8 mEq/L (ref 3.7–5.3)
Sodium: 122 mEq/L — ABNORMAL LOW (ref 137–147)

## 2014-07-29 LAB — GLUCOSE, CAPILLARY
GLUCOSE-CAPILLARY: 144 mg/dL — AB (ref 70–99)
Glucose-Capillary: 236 mg/dL — ABNORMAL HIGH (ref 70–99)
Glucose-Capillary: 147 mg/dL — ABNORMAL HIGH (ref 70–99)
Glucose-Capillary: 100 mg/dL — ABNORMAL HIGH (ref 70–99)

## 2014-07-29 LAB — URINE MICROSCOPIC-ADD ON

## 2014-07-29 MED ORDER — DOCUSATE SODIUM 100 MG PO CAPS
100.0000 mg | ORAL_CAPSULE | Freq: Two times a day (BID) | ORAL | Status: DC
  Administered 2014-07-29 – 2014-07-31 (×6): 100 mg via ORAL
  Filled 2014-07-29 (×7): qty 1

## 2014-07-29 MED ORDER — IOHEXOL 300 MG/ML  SOLN
25.0000 mL | INTRAMUSCULAR | Status: AC
  Administered 2014-07-29 (×2): 25 mL via ORAL

## 2014-07-29 MED ORDER — DOCUSATE SODIUM 100 MG PO CAPS
100.0000 mg | ORAL_CAPSULE | Freq: Two times a day (BID) | ORAL | Status: DC | PRN
Start: 2014-07-29 — End: 2014-07-29

## 2014-07-29 NOTE — Progress Notes (Signed)
Inpatient Diabetes Program Recommendations  AACE/ADA: New Consensus Statement on Inpatient Glycemic Control (2013)  Target Ranges:  Prepandial:   less than 140 mg/dL      Peak postprandial:   less than 180 mg/dL (1-2 hours)      Critically ill patients:  140 - 180 mg/dL   Results for Justin KeensSTACEY, [name] (MRN [id number]) as of 07/29/2014 10:39  Ref. Range 07/28/2014 06:42 07/28/2014 11:36 07/28/2014 16:42 07/28/2014 21:30 07/29/2014 06:20  Glucose-Capillary Latest Range: 70-99 mg/dL 621239 (H) 308178 (H) 657344 (H) 194 (H) 236 (H)   Based on current blood sugars, may want to consider increasing 70/30 insulin to 50 units bid.   Susette RacerJulie Silveria Botz, RN, BA, MHA, CDE Diabetes Coordinator Inpatient Diabetes Program  [phone] (Team Pager) [phone] Patrcia Dolly([place] Office) 07/29/2014 10:40 AM

## 2014-07-29 NOTE — Progress Notes (Signed)
UA and UC sent this am, lab called to confirm specimen and correct requisitions were sent.  Results still pending.

## 2014-07-29 NOTE — Progress Notes (Signed)
S:  CO LLQ abd pain O:BP 123/75  Pulse 99  Temp(Src) 98.4 F (36.9 C) (Oral)  Resp 20  Ht 5\' 7"  (1.702 m)  Wt 54.3 kg (119 lb 11.4 oz)  BMI 18.74 kg/m2  SpO2 100%  Intake/Output Summary (Last 24 hours) at 07/29/14 1122 Last data filed at 07/29/14 1107  Gross per 24 hour  Intake   1220 ml  Output   4600 ml  Net  -3380 ml   Weight change: -0.177 kg (-6.2 oz) WUJ:WJXBJGen:awake and alert CVS:RRR Resp:Clear Abd:+ BS Tenderness LLQ with some guarding, no rebound Ext:no edema NEURO: CNI Ox3   . amLODipine  10 mg Oral Daily  .  ceFAZolin (ANCEF) IV  2 g Intravenous 3 times per day  . feeding supplement (GLUCERNA SHAKE)  237 mL Oral Q24H  . furosemide  40 mg Oral BID  . hydrALAZINE  50 mg Oral 3 times per day  . insulin aspart  0-20 Units Subcutaneous TID WC  . insulin aspart  0-5 Units Subcutaneous QHS  . insulin aspart  10 Units Subcutaneous Once  . insulin aspart protamine- aspart  45 Units Subcutaneous BID WC  . iohexol  25 mL Oral Q1 Hr x 2  . multivitamin with minerals  1 tablet Oral Daily  . pantoprazole  40 mg Oral QHS  . polyethylene glycol  17 g Oral Daily  . QUEtiapine  25 mg Oral BID  . QUEtiapine  50 mg Oral QHS  . senna-docusate  2 tablet Oral QHS  . tuberculin  5 Units Intradermal Once   No results found. BMET    Component Value Date/Time   NA 122* 07/29/2014 0640   K 3.8 07/29/2014 0640   CL 86* 07/29/2014 0640   CO2 20 07/29/2014 0640   GLUCOSE 243* 07/29/2014 0640   BUN 9 07/29/2014 0640   CREATININE 0.73 07/29/2014 0640   CALCIUM 8.3* 07/29/2014 0640   GFRNONAA >90 07/29/2014 0640   GFRAA >90 07/29/2014 0640   CBC    Component Value Date/Time   WBC 7.9 07/25/2014 0944   RBC 4.36 07/25/2014 0944   HGB 12.4* 07/25/2014 0944   HCT 35.0* 07/25/2014 0944   PLT 400 07/25/2014 0944   MCV 80.3 07/25/2014 0944   MCH 28.4 07/25/2014 0944   MCHC 35.4 07/25/2014 0944   RDW 12.6 07/25/2014 0944   LYMPHSABS 2.4 07/23/2014 1820   MONOABS 0.6 07/23/2014 1820   EOSABS 0.0  07/23/2014 1820   BASOSABS 0.0 07/23/2014 1820     Assessment: 1. Hyponatremia most likely secondary to SIADH, SNa sl lower today which does not make sense given his neg fluid balance yest? 2. SAH 3. HTN 4. Coag neg staph bacteremia 5 Schizophrenia 6. ABD pain and fevers Plan: 1. Note plans for CT abd 2. Cont lasix dose.  If made NPO then will need to change it to IV 3. Recheck Sna in am   Dorrell Mitcheltree T

## 2014-07-29 NOTE — Progress Notes (Signed)
ANTIBIOTIC CONSULT NOTE - INITIAL  Pharmacy Consult for cefazolin Indication:  Bacteremia  Allergies  Allergen Reactions  . Fish Allergy     All seafood makes his throat swell  . Tomato Anaphylaxis and Shortness Of Breath    Cannot breathe  . Lactose Intolerance (Gi) Diarrhea and Nausea And Vomiting    Patient Measurements: Height: 5\' 7"  (170.2 cm) Weight: 119 lb 11.4 oz (54.3 kg) IBW/kg (Calculated) : 66.1   Vital Signs: Temp: 99.6 F (37.6 C) (10/09 1458) Temp Source: Oral (10/09 1458) BP: 150/74 mmHg (10/09 1458) Pulse Rate: 105 (10/09 1458) Intake/Output from previous day: 10/08 0701 - 10/09 0700 In: 1217 [P.O.:1167; IV Piggyback:50] Out: 3800 [Urine:3800] Intake/Output from this shift: Total I/O In: 890 [P.O.:890] Out: 1600 [Urine:1600]  Labs:  Recent Labs  07/27/14 0925 07/28/14 0905 07/29/14 0640  CREATININE 0.88 0.78 0.73   Estimated Creatinine Clearance: 76.4 ml/min (by C-G formula based on Cr of 0.73). No results found for this basename: VANCOTROUGH, Leodis BinetVANCOPEAK, VANCORANDOM, GENTTROUGH, GENTPEAK, GENTRANDOM, TOBRATROUGH, TOBRAPEAK, TOBRARND, AMIKACINPEAK, AMIKACINTROU, AMIKACIN,  in the last 72 hours   Microbiology: Recent Results (from the past 720 hour(s))  MRSA PCR SCREENING     Status: None   Collection Time    07/16/14  9:15 PM      Result Value Ref Range Status   MRSA by PCR NEGATIVE  NEGATIVE Final   Comment:            The GeneXpert MRSA Assay (FDA     approved for NASAL specimens     only), is one component of a     comprehensive MRSA colonization     surveillance program. It is not     intended to diagnose MRSA     infection nor to guide or     monitor treatment for     MRSA infections.  CULTURE, BLOOD (ROUTINE X 2)     Status: None   Collection Time    07/22/14  1:26 PM      Result Value Ref Range Status   Specimen Description BLOOD RIGHT ARM   Final   Special Requests BOTTLES DRAWN AEROBIC AND ANAEROBIC 10CC   Final   Culture   Setup Time     Final   Value: 07/22/2014 17:54     Performed at [facility]   Culture     Final   Value: STAPHYLOCOCCUS SPECIES (COAGULASE NEGATIVE)     Note: SUSCEPTIBILITIES PERFORMED ON PREVIOUS CULTURE WITHIN THE LAST 5 DAYS.     Note: Gram Stain Report Called to,Read Back By and Verified With: ANNA MARIA VASIADIAS 07/23/14 @ 456PM BY RUSCOE A.     Performed at [facility]   Report Status 07/25/2014 FINAL   Final  CULTURE, BLOOD (ROUTINE X 2)     Status: None   Collection Time    07/22/14  1:35 PM      Result Value Ref Range Status   Specimen Description BLOOD LEFT ARM   Final   Special Requests BOTTLES DRAWN AEROBIC ONLY 10CC   Final   Culture  Setup Time     Final   Value: 07/22/2014 17:54     Performed at [facility]   Culture     Final   Value: STAPHYLOCOCCUS SPECIES (COAGULASE NEGATIVE)     Note: RIFAMPIN AND GENTAMICIN SHOULD NOT BE USED AS SINGLE DRUGS FOR TREATMENT OF STAPH INFECTIONS. This organism DOES NOT demonstrate inducible Clindamycin resistance in vitro.  Note: Gram Stain Report Called to,Read Back By and Verified With: ANNA MARIA VASIADIAS 07/23/14 @ 1231PM BY RUSCOE A.     Performed at [facility]   Report Status 07/25/2014 FINAL   Final   Organism ID, Bacteria STAPHYLOCOCCUS SPECIES (COAGULASE NEGATIVE)   Final  CULTURE, BLOOD (ROUTINE X 2)     Status: None   Collection Time    07/23/14  6:17 PM      Result Value Ref Range Status   Specimen Description BLOOD RIGHT ARM   Final   Special Requests BOTTLES DRAWN AEROBIC ONLY 6 CC   Final   Culture  Setup Time     Final   Value: 07/24/2014 00:48     Performed at [facility]   Culture     Final   Value:        BLOOD CULTURE RECEIVED NO GROWTH TO DATE CULTURE WILL BE HELD FOR 5 DAYS BEFORE ISSUING A FINAL NEGATIVE REPORT     Performed at [facility]   Report Status PENDING   Incomplete  CULTURE, BLOOD (ROUTINE X 2)     Status: None   Collection Time     07/23/14  6:20 PM      Result Value Ref Range Status   Specimen Description BLOOD LEFT ARM   Final   Special Requests BOTTLES DRAWN AEROBIC AND ANAEROBIC 10 CC   Final   Culture  Setup Time     Final   Value: 07/24/2014 00:48     Performed at [facility]   Culture     Final   Value:        BLOOD CULTURE RECEIVED NO GROWTH TO DATE CULTURE WILL BE HELD FOR 5 DAYS BEFORE ISSUING A FINAL NEGATIVE REPORT     Performed at [facility]   Report Status PENDING   Incomplete  GC/CHLAMYDIA PROBE AMP     Status: None   Collection Time    07/25/14  4:42 AM      Result Value Ref Range Status   CT Probe RNA NEGATIVE  NEGATIVE Final   GC Probe RNA NEGATIVE  NEGATIVE Final   Comment: (NOTE)                                                                                               **Normal Reference Range: Negative**          Assay performed using the Gen-Probe APTIMA COMBO2 (R) Assay.     Acceptable specimen types for this assay include APTIMA Swabs (Unisex,     endocervical, urethral, or vaginal), first void urine, and ThinPrep     liquid based cytology samples.     Performed at [facility]    Medical History: Past Medical History  Diagnosis Date  . Diabetes mellitus   . Hypertension   . Gout   . MI (myocardial infarction)   . Chronic pain     right elbow  . Major depression, chronic   . Bipolar disorder   . Schizophrenia     Assessment: 58 yo M with  2 of 2 blood cultures + methicillin sensitive coag neg staph. He is to finish 10 days of ancef and change to PO cipro per ID.  Will not need further dose adjustments so rx will sign off.   Goal of Therapy:  Appropriate dosing for renal function and eradication of infection.  Plan:   Cefazolin 2 gm IV q8h Rx will sign off
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
## 2014-07-30 LAB — GLUCOSE, CAPILLARY
GLUCOSE-CAPILLARY: 100 mg/dL — AB (ref 70–99)
GLUCOSE-CAPILLARY: 57 mg/dL — AB (ref 70–99)
Glucose-Capillary: 135 mg/dL — ABNORMAL HIGH (ref 70–99)
Glucose-Capillary: 251 mg/dL — ABNORMAL HIGH (ref 70–99)
Glucose-Capillary: 74 mg/dL (ref 70–99)

## 2014-07-30 LAB — URINE CULTURE
Colony Count: NO GROWTH
Culture: NO GROWTH

## 2014-07-30 LAB — CBC WITH DIFFERENTIAL/PLATELET
BASOS ABS: 0 10*3/uL (ref 0.0–0.1)
BASOS PCT: 0 % (ref 0–1)
EOS ABS: 0 10*3/uL (ref 0.0–0.7)
EOS PCT: 0 % (ref 0–5)
HEMATOCRIT: 31.4 % — AB (ref 39.0–52.0)
Hemoglobin: 11.1 g/dL — ABNORMAL LOW (ref 13.0–17.0)
Lymphocytes Relative: 39 % (ref 12–46)
Lymphs Abs: 3.4 10*3/uL (ref 0.7–4.0)
MCH: 28.5 pg (ref 26.0–34.0)
MCHC: 35.4 g/dL (ref 30.0–36.0)
MCV: 80.5 fL (ref 78.0–100.0)
Monocytes Absolute: 0.7 10*3/uL (ref 0.1–1.0)
Monocytes Relative: 8 % (ref 3–12)
Neutro Abs: 4.6 10*3/uL (ref 1.7–7.7)
Neutrophils Relative %: 53 % (ref 43–77)
PLATELETS: 443 10*3/uL — AB (ref 150–400)
RBC: 3.9 MIL/uL — ABNORMAL LOW (ref 4.22–5.81)
RDW: 12.9 % (ref 11.5–15.5)
WBC: 8.7 10*3/uL (ref 4.0–10.5)

## 2014-07-30 LAB — CULTURE, BLOOD (ROUTINE X 2)
Culture: NO GROWTH
Culture: NO GROWTH

## 2014-07-30 LAB — COMPREHENSIVE METABOLIC PANEL
ALT: <5 U/L (ref 0–53)
AST: 11 U/L (ref 0–37)
Albumin: 2.9 g/dL — ABNORMAL LOW (ref 3.5–5.2)
Alkaline Phosphatase: 71 U/L (ref 39–117)
Anion gap: 16 — ABNORMAL HIGH (ref 5–15)
BILIRUBIN TOTAL: 0.5 mg/dL (ref 0.3–1.2)
BUN: 12 mg/dL (ref 6–23)
CHLORIDE: 87 meq/L — AB (ref 96–112)
CO2: 21 meq/L (ref 19–32)
CREATININE: 0.83 mg/dL (ref 0.50–1.35)
Calcium: 8.7 mg/dL (ref 8.4–10.5)
GFR calc Af Amer: >90 mL/min (ref >90)
Glucose, Bld: 83 mg/dL (ref 70–99)
POTASSIUM: 3.9 meq/L (ref 3.7–5.3)
Sodium: 124 mEq/L — ABNORMAL LOW (ref 137–147)
Total Protein: 7 g/dL (ref 6.0–8.3)

## 2014-07-30 LAB — URINE MICROSCOPIC-ADD ON

## 2014-07-30 LAB — URINALYSIS, ROUTINE W REFLEX MICROSCOPIC
BILIRUBIN URINE: NEGATIVE
GLUCOSE, UA: NEGATIVE mg/dL
Hgb urine dipstick: NEGATIVE
KETONES UR: NEGATIVE mg/dL
Leukocytes, UA: NEGATIVE
NITRITE: NEGATIVE
PH: 7.5 (ref 5.0–8.0)
PROTEIN: 100 mg/dL — AB
Specific Gravity, Urine: 1.013 (ref 1.005–1.030)
Urobilinogen, UA: 1 mg/dL (ref 0.0–1.0)

## 2014-07-30 MED ORDER — FLEET ENEMA 7-19 GM/118ML RE ENEM: 1.0000 | ENEMA | Freq: Every day | RECTAL | Status: DC | PRN
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
## 2014-07-30 NOTE — Progress Notes (Signed)
Late Entry: On Monday, Oct. 5th  Pt received Fentanyl 25 mcg IV for complaint of chest pain. Medication was not scanned due to urgent nature of need. Medication not wasted in [facility] but witnessed by fellow employee Talbot GrumblingFrancisca Okafor. Salvadore OxfordJessica Dozier Berkovich, RN 07/30/2014 [phone]

## 2014-07-30 NOTE — Progress Notes (Signed)
S:  CO dysuria and suprapubic tenderness O:BP 153/80  Pulse 81  Temp(Src) 98.3 F (36.8 C) (Oral)  Resp 20  Ht 5\' 7"  (1.702 m)  Wt 57.9 kg (127 lb 10.3 oz)  BMI 19.99 kg/m2  SpO2 100%  Intake/Output Summary (Last 24 hours) at 07/30/14 1049 Last data filed at 07/30/14 0529  Gross per 24 hour  Intake    890 ml  Output   2420 ml  Net  -1530 ml   Weight change: 3.6 kg (7 lb 15 oz) ZOX:WRUEAGen:awake and alert CVS:RRR Resp:Clear Abd:+ BS  Mild suprapubic tenderness Ext:no edema NEURO: CNI Ox3   . amLODipine  10 mg Oral Daily  .  ceFAZolin (ANCEF) IV  2 g Intravenous 3 times per day  . docusate sodium  100 mg Oral BID  . feeding supplement (GLUCERNA SHAKE)  237 mL Oral Q24H  . furosemide  40 mg Oral BID  . hydrALAZINE  50 mg Oral 3 times per day  . insulin aspart  0-20 Units Subcutaneous TID WC  . insulin aspart  0-5 Units Subcutaneous QHS  . insulin aspart  10 Units Subcutaneous Once  . insulin aspart protamine- aspart  45 Units Subcutaneous BID WC  . multivitamin with minerals  1 tablet Oral Daily  . pantoprazole  40 mg Oral QHS  . polyethylene glycol  17 g Oral Daily  . QUEtiapine  25 mg Oral BID  . QUEtiapine  50 mg Oral QHS  . senna-docusate  2 tablet Oral QHS  . tuberculin  5 Units Intradermal Once   Ct Abdomen Pelvis Wo Contrast  07/29/2014   CLINICAL DATA:  Left-sided abdominal pain, nausea  EXAM: CT ABDOMEN AND PELVIS WITHOUT CONTRAST  TECHNIQUE: Multidetector CT imaging of the abdomen and pelvis was performed following the standard protocol without IV contrast.  COMPARISON:  10/01/2013  FINDINGS: The lung bases are clear.  No renal, ureteral, or bladder calculi. No obstructive uropathy. No perinephric stranding is seen. The kidneys are symmetric in size without evidence for exophytic mass. The bladder is distended  The liver demonstrates no focal abnormality. The gallbladder is unremarkable. The spleen demonstrates no focal abnormality. The adrenal glands and pancreas are  normal.  The stomach, duodenum, small intestine and large intestine are unremarkable. There is a normal caliber appendix in the right lower quadrant without periappendiceal inflammatory changes.There is a moderate amount of stool throughout the colon. There is no pneumoperitoneum, pneumatosis, or portal venous gas. There is no abdominal or pelvic free fluid. There is no lymphadenopathy.  The abdominal aorta is normal in caliber with atherosclerosis.  There is degenerative disc disease most severe at L3-4 and L4-5. There is a broad-based disc osteophyte complex at L4-5.  IMPRESSION: 1. No acute abdominal or pelvic pathology.   Electronically Signed   By: Elige KoHetal  Patel   On: 07/29/2014 13:22   BMET    Component Value Date/Time   NA 124* 07/30/2014 0340   K 3.9 07/30/2014 0340   CL 87* 07/30/2014 0340   CO2 21 07/30/2014 0340   GLUCOSE 83 07/30/2014 0340   BUN 12 07/30/2014 0340   CREATININE 0.83 07/30/2014 0340   CALCIUM 8.7 07/30/2014 0340   GFRNONAA >90 07/30/2014 0340   GFRAA >90 07/30/2014 0340   CBC    Component Value Date/Time   WBC 8.7 07/30/2014 0340   RBC 3.90* 07/30/2014 0340   HGB 11.1* 07/30/2014 0340   HCT 31.4* 07/30/2014 0340   PLT 443* 07/30/2014 0340  MCV 80.5 07/30/2014 0340   MCH 28.5 07/30/2014 0340   MCHC 35.4 07/30/2014 0340   RDW 12.9 07/30/2014 0340   LYMPHSABS 3.4 07/30/2014 0340   MONOABS 0.7 07/30/2014 0340   EOSABS 0.0 07/30/2014 0340   BASOSABS 0.0 07/30/2014 0340     Assessment: 1. Hyponatremia most likely secondary to SIADH, Neg fluid balance, Sna fractionally better 2. SAH 3. HTN 4. Coag neg staph bacteremia on ancef 5 Schizophrenia 6. Dysuria Plan: 1. Check UA though UA from yest looks fine.  Beginning to think that his CO are pyschosomatic 2. Recheck SNa in am   Deshawna Mcneece T

## 2014-07-31 LAB — GLUCOSE, CAPILLARY
GLUCOSE-CAPILLARY: 66 mg/dL — AB (ref 70–99)
GLUCOSE-CAPILLARY: 95 mg/dL (ref 70–99)
Glucose-Capillary: 138 mg/dL — ABNORMAL HIGH (ref 70–99)
Glucose-Capillary: 147 mg/dL — ABNORMAL HIGH (ref 70–99)

## 2014-07-31 LAB — BASIC METABOLIC PANEL
Anion gap: 16 — ABNORMAL HIGH (ref 5–15)
BUN: 17 mg/dL (ref 6–23)
CO2: 22 meq/L (ref 19–32)
CREATININE: 0.86 mg/dL (ref 0.50–1.35)
Calcium: 8.4 mg/dL (ref 8.4–10.5)
Chloride: 87 mEq/L — ABNORMAL LOW (ref 96–112)
GFR calc Af Amer: >90 mL/min (ref >90)
GFR calc non Af Amer: >90 mL/min (ref >90)
Glucose, Bld: 67 mg/dL — ABNORMAL LOW (ref 70–99)
Potassium: 3.5 mEq/L — ABNORMAL LOW (ref 3.7–5.3)
Sodium: 125 mEq/L — ABNORMAL LOW (ref 137–147)

## 2014-07-31 MED ORDER — POTASSIUM CHLORIDE CRYS ER 20 MEQ PO TBCR
20.0000 meq | EXTENDED_RELEASE_TABLET | Freq: Two times a day (BID) | ORAL | Status: AC
  Administered 2014-07-31 (×2): 20 meq via ORAL
  Filled 2014-07-31 (×2): qty 1

## 2014-07-31 MED ORDER — INSULIN ASPART PROT & ASPART (70-30 MIX) 100 UNIT/ML ~~LOC~~ SUSP
40.0000 [IU] | Freq: Two times a day (BID) | SUBCUTANEOUS | Status: DC
  Administered 2014-08-01: 40 [IU] via SUBCUTANEOUS

## 2014-07-31 NOTE — Progress Notes (Signed)
Patient requested for his money that was locked in the safe, writer went and picked it up from the security office and gave it back to patient. Patient signed the form and confirm that he received the one hundred dollar note.

## 2014-07-31 NOTE — Progress Notes (Signed)
S:  Dysuria gone.  NO UO recorded for yest? O:BP 135/71  Pulse 92  Temp(Src) 98.1 F (36.7 C) (Oral)  Resp 20  Ht 5\' 7"  (1.702 m)  Wt 58.3 kg (128 lb 8.5 oz)  BMI 20.13 kg/m2  SpO2 100%  Intake/Output Summary (Last 24 hours) at 07/31/14 0942 Last data filed at 07/30/14 2025  Gross per 24 hour  Intake    360 ml  Output      0 ml  Net    360 ml   Weight change: 0.4 kg (14.1 oz) ZOX:WRUEAGen:awake and alert CVS:RRR Resp:Clear Abd:+ BS  NTND Ext:no edema NEURO: CNI Ox3   . amLODipine  10 mg Oral Daily  .  ceFAZolin (ANCEF) IV  2 g Intravenous 3 times per day  . docusate sodium  100 mg Oral BID  . feeding supplement (GLUCERNA SHAKE)  237 mL Oral Q24H  . furosemide  40 mg Oral BID  . hydrALAZINE  50 mg Oral 3 times per day  . insulin aspart  0-20 Units Subcutaneous TID WC  . insulin aspart  0-5 Units Subcutaneous QHS  . insulin aspart  10 Units Subcutaneous Once  . insulin aspart protamine- aspart  40 Units Subcutaneous BID WC  . multivitamin with minerals  1 tablet Oral Daily  . pantoprazole  40 mg Oral QHS  . polyethylene glycol  17 g Oral Daily  . QUEtiapine  25 mg Oral BID  . QUEtiapine  50 mg Oral QHS  . senna-docusate  2 tablet Oral QHS  . tuberculin  5 Units Intradermal Once   Ct Abdomen Pelvis Wo Contrast  07/29/2014   CLINICAL DATA:  Left-sided abdominal pain, nausea  EXAM: CT ABDOMEN AND PELVIS WITHOUT CONTRAST  TECHNIQUE: Multidetector CT imaging of the abdomen and pelvis was performed following the standard protocol without IV contrast.  COMPARISON:  10/01/2013  FINDINGS: The lung bases are clear.  No renal, ureteral, or bladder calculi. No obstructive uropathy. No perinephric stranding is seen. The kidneys are symmetric in size without evidence for exophytic mass. The bladder is distended  The liver demonstrates no focal abnormality. The gallbladder is unremarkable. The spleen demonstrates no focal abnormality. The adrenal glands and pancreas are normal.  The stomach,  duodenum, small intestine and large intestine are unremarkable. There is a normal caliber appendix in the right lower quadrant without periappendiceal inflammatory changes.There is a moderate amount of stool throughout the colon. There is no pneumoperitoneum, pneumatosis, or portal venous gas. There is no abdominal or pelvic free fluid. There is no lymphadenopathy.  The abdominal aorta is normal in caliber with atherosclerosis.  There is degenerative disc disease most severe at L3-4 and L4-5. There is a broad-based disc osteophyte complex at L4-5.  IMPRESSION: 1. No acute abdominal or pelvic pathology.   Electronically Signed   By: Elige KoHetal  Patel   On: 07/29/2014 13:22   BMET    Component Value Date/Time   NA 125* 07/31/2014 0340   K 3.5* 07/31/2014 0340   CL 87* 07/31/2014 0340   CO2 22 07/31/2014 0340   GLUCOSE 67* 07/31/2014 0340   BUN 17 07/31/2014 0340   CREATININE 0.86 07/31/2014 0340   CALCIUM 8.4 07/31/2014 0340   GFRNONAA >90 07/31/2014 0340   GFRAA >90 07/31/2014 0340   CBC    Component Value Date/Time   WBC 8.7 07/30/2014 0340   RBC 3.90* 07/30/2014 0340   HGB 11.1* 07/30/2014 0340   HCT 31.4* 07/30/2014 0340   PLT 443*  07/30/2014 0340   MCV 80.5 07/30/2014 0340   MCH 28.5 07/30/2014 0340   MCHC 35.4 07/30/2014 0340   RDW 12.9 07/30/2014 0340   LYMPHSABS 3.4 07/30/2014 0340   MONOABS 0.7 07/30/2014 0340   EOSABS 0.0 07/30/2014 0340   BASOSABS 0.0 07/30/2014 0340     Assessment: 1. 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 SAH 3. HTN 4. Coag neg staph bacteremia on ancef 5 Schizophrenia  Plan: 1.Cont lasix 2. Replace K 3. Recheck SNa in AM   Nikala Walsworth T

## 2014-07-31 NOTE — Progress Notes (Signed)
No change in current dc plan at this time.  Plan [facility] [place] when medically stable per MD.  CSW services will continue to follow and assist with d/c. Planning as indicated.  Lorri Frederickonna T. West PughCrowder, LCSWA [phone] (weekend coverage)
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
## 2014-08-01 LAB — BASIC METABOLIC PANEL
ANION GAP: 16 — AB (ref 5–15)
BUN: 24 mg/dL — AB (ref 6–23)
CHLORIDE: 91 meq/L — AB (ref 96–112)
CO2: 21 meq/L (ref 19–32)
Calcium: 8.5 mg/dL (ref 8.4–10.5)
Creatinine, Ser: 1.08 mg/dL (ref 0.50–1.35)
GFR calc Af Amer: 85 mL/min — ABNORMAL LOW (ref >90)
GFR calc non Af Amer: 73 mL/min — ABNORMAL LOW (ref >90)
Glucose, Bld: 263 mg/dL — ABNORMAL HIGH (ref 70–99)
Potassium: 3.9 mEq/L (ref 3.7–5.3)
Sodium: 128 mEq/L — ABNORMAL LOW (ref 137–147)

## 2014-08-01 LAB — GLUCOSE, CAPILLARY
GLUCOSE-CAPILLARY: 269 mg/dL — AB (ref 70–99)
GLUCOSE-CAPILLARY: 59 mg/dL — AB (ref 70–99)
GLUCOSE-CAPILLARY: 126 mg/dL — AB (ref 70–99)
Glucose-Capillary: 172 mg/dL — ABNORMAL HIGH (ref 70–99)
Glucose-Capillary: 216 mg/dL — ABNORMAL HIGH (ref 70–99)

## 2014-08-01 MED ORDER — INSULIN ASPART 100 UNIT/ML ~~LOC~~ SOLN: 0.0000 [IU] | Freq: Three times a day (TID) | SUBCUTANEOUS | Status: DC

## 2014-08-01 MED ORDER — QUETIAPINE FUMARATE 50 MG PO TABS: 50.0000 mg | ORAL_TABLET | Freq: Every day | ORAL | Status: DC

## 2014-08-01 MED ORDER — METOPROLOL TARTRATE 25 MG PO TABS: 25.0000 mg | ORAL_TABLET | Freq: Every day | ORAL | Status: DC

## 2014-08-01 MED ORDER — HYDRALAZINE HCL 50 MG PO TABS: 50.0000 mg | ORAL_TABLET | Freq: Three times a day (TID) | ORAL | Status: DC

## 2014-08-01 MED ORDER — INSULIN ASPART PROT & ASPART (70-30 MIX) 100 UNIT/ML ~~LOC~~ SUSP
43.0000 [IU] | Freq: Two times a day (BID) | SUBCUTANEOUS | Status: DC
  Administered 2014-08-02: 43 [IU] via SUBCUTANEOUS

## 2014-08-01 MED ORDER — QUETIAPINE FUMARATE 25 MG PO TABS: 25.0000 mg | ORAL_TABLET | Freq: Every morning | ORAL | Status: DC

## 2014-08-01 MED ORDER — FUROSEMIDE 40 MG PO TABS: 40.0000 mg | ORAL_TABLET | Freq: Two times a day (BID) | ORAL | Status: DC

## 2014-08-01 MED ORDER — QUETIAPINE FUMARATE 25 MG PO TABS
25.0000 mg | ORAL_TABLET | Freq: Every morning | ORAL | Status: DC
  Administered 2014-08-02: 25 mg via ORAL
  Filled 2014-08-01: qty 1

## 2014-08-01 MED ORDER — DSS 100 MG PO CAPS: 100.0000 mg | ORAL_CAPSULE | Freq: Two times a day (BID) | ORAL | Status: DC

## 2014-08-01 MED ORDER — CIPROFLOXACIN HCL 500 MG PO TABS: 500.0000 mg | ORAL_TABLET | Freq: Two times a day (BID) | ORAL | Status: DC

## 2014-08-01 MED ORDER — ADULT MULTIVITAMIN W/MINERALS CH
1.0000 | ORAL_TABLET | Freq: Every day | ORAL | Status: DC
Start: 2014-08-01 — End: 2014-12-29

## 2014-08-01 MED ORDER — POLYETHYLENE GLYCOL 3350 17 G PO PACK: 17.0000 g | PACK | Freq: Every day | ORAL | Status: DC | PRN

## 2014-08-01 MED ORDER — QUETIAPINE FUMARATE 25 MG PO TABS: 25.0000 mg | ORAL_TABLET | Freq: Two times a day (BID) | ORAL | Status: DC

## 2014-08-01 MED ORDER — PANTOPRAZOLE SODIUM 40 MG PO TBEC: 40.0000 mg | DELAYED_RELEASE_TABLET | Freq: Every day | ORAL | Status: DC

## 2014-08-01 MED ORDER — INSULIN ASPART 100 UNIT/ML ~~LOC~~ SOLN
0.0000 [IU] | Freq: Three times a day (TID) | SUBCUTANEOUS | Status: DC
  Administered 2014-08-01: 2 [IU] via SUBCUTANEOUS
  Administered 2014-08-02: 3 [IU] via SUBCUTANEOUS

## 2014-08-01 MED ORDER — INSULIN NPH ISOPHANE & REGULAR (70-30) 100 UNIT/ML ~~LOC~~ SUSP: 43.0000 [IU] | Freq: Two times a day (BID) | SUBCUTANEOUS | Status: DC

## 2014-08-01 MED ORDER — AMLODIPINE BESYLATE 10 MG PO TABS: 10.0000 mg | ORAL_TABLET | Freq: Every day | ORAL | Status: DC

## 2014-08-01 NOTE — Progress Notes (Signed)
Patient ID: [name], male   DOB: 18-May-1956, 58 y.o.   MRN: [id number] Patient ID: [name], male   DOB: 07/02/1956, 58 y.o.   MRN: [id number] Patient ID: [name], male   DOB: 1956-02-12, 58 y.o.   MRN: [id number] [facility] MD Progress Note  08/01/2014 10:31 AM [name]  MRN:  [id number]  Subjective:  Patient is seen today for psychiatric consultation follow up. Patient appeared lying on his bed and is awake, alert, oriented well. He has physical complaints of weakness, dizzy and frequent urination. He denied current symptoms of depression, anxiety, substance abuse and has no disturbance of sleep and appetite. He has been working with physical therapist and stated that he is able to walk only on hall way and hoping to be placed in [place], [place], close to his mother and sister. He has denied current suicidal ideation, intention or plans. He has no evidence of psychotic features.    Diagnosis:   DSM5: Depressive Disorders:  Major Depressive Disorder - with Psychotic Features (296.24) Total Time spent with patient: 30 minutes  Axis I: Generalized Anxiety Disorder, Major Depression, Recurrent severe and Cocaine abuse  ADL's:  Impaired  Sleep: Poor  Appetite:  Poor  Suicidal Ideation:  Endorses suicidal ideation without plan Homicidal Ideation:  Denied AEB (as evidenced by):  Psychiatric Specialty Exam: Physical Exam  ROS  Blood pressure 131/69, pulse 100, temperature 97.8 F (36.6 C), temperature source Oral, resp. rate 16, height '5\' 7"'  (1.702 m), weight 60 kg (132 lb 4.4 oz), SpO2 100.00%.Body mass index is 20.71 kg/(m^2).  General Appearance: Disheveled and Guarded  Eye Contact::  Minimal  Speech:  Clear and Coherent and Slow  Volume:  Decreased  Mood:  Anxious, Depressed and Hopeless  Affect:  Depressed and Flat  Thought Process:  Coherent and Goal Directed  Orientation:  Full (Time, Place, and Person)  Thought Content:  Rumination  Suicidal Thoughts:  No   Homicidal Thoughts:  No  Memory:  Immediate;   Fair Recent;   Fair  Judgement:  Impaired  Insight:  Fair  Psychomotor Activity:  Psychomotor Retardation  Concentration:  Fair  Recall:  [employer] of Knowledge:Fair  Language: Good  Akathisia:  NA  Handed:  Right  AIMS (if indicated):     Assets:  Communication Skills Leisure Time Resilience Social Support  Sleep:      Musculoskeletal: Strength & Muscle Tone: decreased Gait & Station: unable to stand Patient leans: N/A  Current Medications: Current Facility-Administered Medications  Medication Dose Route Frequency Provider Last Rate Last Dose  . 0.9 %  sodium chloride infusion   Intravenous Continuous Dorothy Spark, MD 20 mL/hr at 07/26/14 1445 500 mL at 07/26/14 1445  . acetaminophen (TYLENOL) tablet 650 mg  650 mg Oral Q6H PRN Reyne Dumas, MD   650 mg at 07/31/14 2105  . amLODipine (NORVASC) tablet 10 mg  10 mg Oral Daily Reyne Dumas, MD   10 mg at 08/01/14 0928  . ceFAZolin (ANCEF) IVPB 2 g/50 mL premix  2 g Intravenous 3 times per day Arman Bogus, RPH   2 g at 08/01/14 0547  . docusate sodium (COLACE) capsule 100 mg  100 mg Oral BID Ripudeep Krystal Eaton, MD   100 mg at 07/31/14 2104  . feeding supplement (GLUCERNA SHAKE) (GLUCERNA SHAKE) liquid 237 mL  237 mL Oral Q24H Baird Lyons, RD   237 mL at 07/30/14 1000  . fentaNYL (SUBLIMAZE) injection 25-100  mcg  25-100 mcg Intravenous Q2H PRN Juanito Doom, MD   100 mcg at 07/31/14 2146  . furosemide (LASIX) tablet 40 mg  40 mg Oral BID Windy Kalata, MD   40 mg at 08/01/14 0755  . hydrALAZINE (APRESOLINE) tablet 50 mg  50 mg Oral 3 times per day Reyne Dumas, MD   50 mg at 08/01/14 0551  . insulin aspart (novoLOG) injection 0-20 Units  0-20 Units Subcutaneous TID WC Collene Gobble, MD   11 Units at 08/01/14 0755  . insulin aspart (novoLOG) injection 0-5 Units  0-5 Units Subcutaneous QHS Collene Gobble, MD   4 Units at 07/27/14 2242  . insulin aspart  (novoLOG) injection 10 Units  10 Units Subcutaneous Once Ripudeep K Rai, MD      . insulin aspart protamine- aspart (NOVOLOG MIX 70/30) injection 43 Units  43 Units Subcutaneous BID WC Ripudeep K Rai, MD      . labetalol (NORMODYNE,TRANDATE) injection 10 mg  10 mg Intravenous QID PRN Reyne Dumas, MD   10 mg at 07/20/14 1231  . metoCLOPramide (REGLAN) injection 5 mg  5 mg Intravenous Q8H PRN Brand Males, MD   5 mg at 07/25/14 1709  . multivitamin with minerals tablet 1 tablet  1 tablet Oral Daily Baird Lyons, RD   1 tablet at 08/01/14 0928  . pantoprazole (PROTONIX) EC tablet 40 mg  40 mg Oral QHS Eudelia Bunch, RPH   40 mg at 07/31/14 2103  . polyethylene glycol (MIRALAX / GLYCOLAX) packet 17 g  17 g Oral Daily Reyne Dumas, MD   17 g at 07/30/14 1013  . QUEtiapine (SEROQUEL) tablet 25 mg  25 mg Oral BID Durward Parcel, MD   25 mg at 08/01/14 8118  . QUEtiapine (SEROQUEL) tablet 50 mg  50 mg Oral QHS Durward Parcel, MD   50 mg at 07/31/14 2104  . senna-docusate (Senokot-S) tablet 2 tablet  2 tablet Oral QHS Ripudeep Krystal Eaton, MD   2 tablet at 07/31/14 2103  . sodium phosphate (FLEET) 7-19 GM/118ML enema 1 enema  1 enema Rectal Daily PRN Ripudeep Krystal Eaton, MD      . tuberculin injection 5 Units  5 Units Intradermal Once Reyne Dumas, MD        Lab Results:  Results for orders placed during the hospital encounter of 07/16/14 (from the past 48 hour(s))  URINALYSIS, ROUTINE W REFLEX MICROSCOPIC     Status: Abnormal   Collection Time    07/30/14 11:27 AM      Result Value Ref Range   Color, Urine YELLOW  YELLOW   APPearance CLEAR  CLEAR   Specific Gravity, Urine 1.013  1.005 - 1.030   pH 7.5  5.0 - 8.0   Glucose, UA NEGATIVE  NEGATIVE mg/dL   Hgb urine dipstick NEGATIVE  NEGATIVE   Bilirubin Urine NEGATIVE  NEGATIVE   Ketones, ur NEGATIVE  NEGATIVE mg/dL   Protein, ur 100 (*) NEGATIVE mg/dL   Urobilinogen, UA 1.0  0.0 - 1.0 mg/dL   Nitrite NEGATIVE  NEGATIVE    Leukocytes, UA NEGATIVE  NEGATIVE  URINE MICROSCOPIC-ADD ON     Status: Abnormal   Collection Time    07/30/14 11:27 AM      Result Value Ref Range   Squamous Epithelial / LPF RARE  RARE   WBC, UA 0-2  <3 WBC/hpf   RBC / HPF 0-2  <3 RBC/hpf   Bacteria, UA RARE  RARE   Casts HYALINE CASTS (*) NEGATIVE  GLUCOSE, CAPILLARY     Status: Abnormal   Collection Time    07/30/14 11:40 AM      Result Value Ref Range   Glucose-Capillary 57 (*) 70 - 99 mg/dL   Comment 1 Notify RN    GLUCOSE, CAPILLARY     Status: Abnormal   Collection Time    07/30/14 12:21 PM      Result Value Ref Range   Glucose-Capillary 135 (*) 70 - 99 mg/dL   Comment 1 Notify RN    GLUCOSE, CAPILLARY     Status: Abnormal   Collection Time    07/30/14  4:20 PM      Result Value Ref Range   Glucose-Capillary 251 (*) 70 - 99 mg/dL   Comment 1 Notify RN    GLUCOSE, CAPILLARY     Status: None   Collection Time    07/30/14  9:32 PM      Result Value Ref Range   Glucose-Capillary 74  70 - 99 mg/dL   Comment 1 Documented in Chart     Comment 2 Notify RN    BASIC METABOLIC PANEL     Status: Abnormal   Collection Time    07/31/14  3:40 AM      Result Value Ref Range   Sodium 125 (*) 137 - 147 mEq/L   Potassium 3.5 (*) 3.7 - 5.3 mEq/L   Chloride 87 (*) 96 - 112 mEq/L   CO2 22  19 - 32 mEq/L   Glucose, Bld 67 (*) 70 - 99 mg/dL   BUN 17  6 - 23 mg/dL   Creatinine, Ser 0.86  0.50 - 1.35 mg/dL   Calcium 8.4  8.4 - 10.5 mg/dL   GFR calc non Af Amer >90  >90 mL/min   GFR calc Af Amer >90  >90 mL/min   Comment: (NOTE)     The eGFR has been calculated using the CKD EPI equation.     This calculation has not been validated in all clinical situations.     eGFR's persistently <90 mL/min signify possible Chronic Kidney     Disease.   Anion gap 16 (*) 5 - 15  GLUCOSE, CAPILLARY     Status: Abnormal   Collection Time    07/31/14  6:42 AM      Result Value Ref Range   Glucose-Capillary 66 (*) 70 - 99 mg/dL   Comment 1  Notify RN     Comment 2 Documented in Chart    GLUCOSE, CAPILLARY     Status: None   Collection Time    07/31/14 11:47 AM      Result Value Ref Range   Glucose-Capillary 95  70 - 99 mg/dL   Comment 1 Documented in Chart     Comment 2 Notify RN    GLUCOSE, CAPILLARY     Status: Abnormal   Collection Time    07/31/14  4:43 PM      Result Value Ref Range   Glucose-Capillary 138 (*) 70 - 99 mg/dL   Comment 1 Notify RN    GLUCOSE, CAPILLARY     Status: Abnormal   Collection Time    07/31/14  8:58 PM      Result Value Ref Range   Glucose-Capillary 147 (*) 70 - 99 mg/dL   Comment 1 Documented in Chart     Comment 2 Notify RN    BASIC METABOLIC PANEL  Status: Abnormal   Collection Time    08/01/14  5:20 AM      Result Value Ref Range   Sodium 128 (*) 137 - 147 mEq/L   Potassium 3.9  3.7 - 5.3 mEq/L   Chloride 91 (*) 96 - 112 mEq/L   CO2 21  19 - 32 mEq/L   Glucose, Bld 263 (*) 70 - 99 mg/dL   BUN 24 (*) 6 - 23 mg/dL   Creatinine, Ser 1.08  0.50 - 1.35 mg/dL   Calcium 8.5  8.4 - 10.5 mg/dL   GFR calc non Af Amer 73 (*) >90 mL/min   GFR calc Af Amer 85 (*) >90 mL/min   Comment: (NOTE)     The eGFR has been calculated using the CKD EPI equation.     This calculation has not been validated in all clinical situations.     eGFR's persistently <90 mL/min signify possible Chronic Kidney     Disease.   Anion gap 16 (*) 5 - 15  GLUCOSE, CAPILLARY     Status: Abnormal   Collection Time    08/01/14  6:43 AM      Result Value Ref Range   Glucose-Capillary 269 (*) 70 - 99 mg/dL   Comment 1 Documented in Chart     Comment 2 Notify RN      Physical Findings: AIMS:  , ,  ,  ,    CIWA:    COWS:     Treatment Plan Summary: Daily contact with patient to assess and evaluate symptoms and progress in treatment Medication management  Plan: Patient is psychiatrically stable for discharge when medically stable.  Change Seroquel 25 mg PO Qam and 50 mg at bed time to reduce  sedation Discntinue safety sitter as patient continue contract for safety Psych consult will sign off at this time.    Medical Decision Making Problem Points:  Established problem, worsening (2), New problem, with no additional work-up planned (3), Review of last therapy session (1) and Review of psycho-social stressors (1) Data Points:  Independent review of image, tracing, or specimen (2) Review or order clinical lab tests (1) Review of medication regiment & side effects (2) Review of new medications or change in dosage (2)  I certify that inpatient services furnished can reasonably be expected to improve the patient's condition.   Nolin Grell,JANARDHAHA R. 08/01/2014, 10:31 AM

## 2014-08-01 NOTE — Progress Notes (Signed)
Called social worker to notify that patient has been D/c, awaiting reply.
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
## 2014-08-01 NOTE — Progress Notes (Signed)
Patient noted voiding frequently and little amount, on examination, bladder not distended, but patient c/o tenderness on palpation. Bladder scanned and <26100ml noted. Patient offered in and out, but patient refused. Will continue to monitor.

## 2014-08-01 NOTE — Progress Notes (Signed)
UR COMPLETED  

## 2014-08-01 NOTE — Progress Notes (Signed)
MD paged regarding patient's d/c.

## 2014-08-01 NOTE — Clinical Social Work Note (Signed)
Clinical Social Worker aware of patient discharge order.  [facility], due to continued sitter at this time and transportation / bed availability patient will have to be discharged in the morning.  CSW has spoken with patient at bedside and updated RN.  CSW remains available for support and to facilitate patient [facility] the [facility] - patient to provide update to his family.  Macario GoldsJesse Isaiahs [place], [place] [phone]

## 2014-08-01 NOTE — Progress Notes (Signed)
Patient blood sugar 59, patient asymptomatic and alert, patient offered orange juice, but he preferred orange soda. He provided money and orange soda given. Will continue to monitor.

## 2014-08-01 NOTE — Progress Notes (Signed)
Called and spoke with [name] in staffing to let him know sitter had been discontinued.  Lance BoschAnna Auther Lyerly RN
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
## 2014-08-01 NOTE — Clinical Social Work Note (Signed)
Clinical Social Worker continuing to follow patient and family for support and discharge planning needs.  CSW spoke with Ms. Wyline MoodRucker regarding continued possible [facility] - Ms. Wyline MoodRucker is in agreement with patient admission once medically stable.  CSW arranged mental health outpatient appointment per Ms. Rucker's request for continued follow up once patient is discharged from the hospital.  Appointment details:  [facility]  [address], [address] [place], [place] [address] Ph: [phone] (fax) [phone] October 21.2015 @ 10:00am  CSW remains available for support and to facilitate patient discharge needs once medically ready for discharge.  Macario GoldsJesse Teckla Christiansen, [place] [phone]

## 2014-08-01 NOTE — Clinical Social Work Psych Note (Signed)
Psychiatrist evaluated pt on 07/27/2014 at which time, Psych CSW requested re-evaluation by psychiatrist for possible clearance [facility]. Disposition pending psych re-evaluation.  Psych CSW will continue to follow.  Vickii PennaGina Aarianna Hoadley, LCSWA [phone]  Psychiatric & Orthopedics (5N 1-16) Clinical Social Worker

## 2014-08-02 LAB — GLUCOSE, CAPILLARY
GLUCOSE-CAPILLARY: 248 mg/dL — AB (ref 70–99)
GLUCOSE-CAPILLARY: 80 mg/dL (ref 70–99)

## 2014-08-02 NOTE — Clinical Social Work Note (Addendum)
Clinical Social Worker continuing to follow patient and family for support and discharge planning needs.  Patient has decided to go home with family instead of [facility].  CSW has updated Ms. Rucker and prepared patient that once discharged home the option will no longer be available - patient agreeable.  CSW spoke with patient sister over the phone to confirm patient plans and transportation - patient sister verbalizes her ability to provide transportation.  RN updated.  CM to order cane for patient at home.    Clinical Social Worker will sign off for now as social work intervention is no longer needed. Please consult us again if new need arises.  Macario GoldsJesse Quentavious [place], [place] [phone]

## 2014-08-02 NOTE — Progress Notes (Signed)
Pt is being discharged home instead of  [facility]. Family said they will take care of him at home. Med administration, discharge instruction were given to patient and family.
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
## 2014-08-02 NOTE — Progress Notes (Signed)
Pt refused Labs this morning. Pt is not steady on his feet even when ambulating with walker.

## 2014-08-23 ENCOUNTER — Emergency Department (HOSPITAL_COMMUNITY): Payer: Medicaid Other

## 2014-08-23 ENCOUNTER — Emergency Department (HOSPITAL_COMMUNITY)
Admission: EM | Admit: 2014-08-23 | Discharge: 2014-08-23 | Disposition: A | Discharge status: Home / Self Care | Payer: Medicaid Other | Attending: Emergency Medicine | Admitting: Emergency Medicine

## 2014-08-23 ENCOUNTER — Encounter (HOSPITAL_COMMUNITY): Payer: Self-pay | Admitting: Emergency Medicine

## 2014-08-23 DIAGNOSIS — R0789 Other chest pain: Secondary | ICD-10-CM | POA: Diagnosis not present

## 2014-08-23 DIAGNOSIS — R61 Generalized hyperhidrosis: Secondary | ICD-10-CM | POA: Insufficient documentation

## 2014-08-23 DIAGNOSIS — R251 Tremor, unspecified: Secondary | ICD-10-CM | POA: Diagnosis not present

## 2014-08-23 DIAGNOSIS — R0600 Dyspnea, unspecified: Secondary | ICD-10-CM | POA: Insufficient documentation

## 2014-08-23 DIAGNOSIS — Z794 Long term (current) use of insulin: Secondary | ICD-10-CM | POA: Insufficient documentation

## 2014-08-23 DIAGNOSIS — Z8739 Personal history of other diseases of the musculoskeletal system and connective tissue: Secondary | ICD-10-CM | POA: Insufficient documentation

## 2014-08-23 DIAGNOSIS — Z79899 Other long term (current) drug therapy: Secondary | ICD-10-CM | POA: Diagnosis not present

## 2014-08-23 DIAGNOSIS — R079 Chest pain, unspecified: Secondary | ICD-10-CM | POA: Diagnosis present

## 2014-08-23 DIAGNOSIS — R440 Auditory hallucinations: Secondary | ICD-10-CM | POA: Insufficient documentation

## 2014-08-23 DIAGNOSIS — Z9861 Coronary angioplasty status: Secondary | ICD-10-CM | POA: Insufficient documentation

## 2014-08-23 DIAGNOSIS — G8929 Other chronic pain: Secondary | ICD-10-CM | POA: Insufficient documentation

## 2014-08-23 DIAGNOSIS — I252 Old myocardial infarction: Secondary | ICD-10-CM | POA: Insufficient documentation

## 2014-08-23 DIAGNOSIS — Z792 Long term (current) use of antibiotics: Secondary | ICD-10-CM | POA: Diagnosis not present

## 2014-08-23 DIAGNOSIS — Z87891 Personal history of nicotine dependence: Secondary | ICD-10-CM | POA: Diagnosis not present

## 2014-08-23 DIAGNOSIS — I10 Essential (primary) hypertension: Secondary | ICD-10-CM | POA: Diagnosis not present

## 2014-08-23 DIAGNOSIS — R4182 Altered mental status, unspecified: Secondary | ICD-10-CM

## 2014-08-23 DIAGNOSIS — R531 Weakness: Secondary | ICD-10-CM | POA: Diagnosis not present

## 2014-08-23 DIAGNOSIS — E119 Type 2 diabetes mellitus without complications: Secondary | ICD-10-CM | POA: Diagnosis not present

## 2014-08-23 HISTORY — DX: Acute respiratory failure, unspecified whether with hypoxia or hypercapnia: J96.00

## 2014-08-23 HISTORY — DX: Bacteremia: R78.81

## 2014-08-23 HISTORY — DX: Hypo-osmolality and hyponatremia: E87.1

## 2014-08-23 HISTORY — DX: Nontraumatic subarachnoid hemorrhage, unspecified: I60.9

## 2014-08-23 LAB — COMPREHENSIVE METABOLIC PANEL
ALBUMIN: 3.7 g/dL (ref 3.5–5.2)
ALT: 11 U/L (ref 0–53)
AST: 14 U/L (ref 0–37)
Alkaline Phosphatase: 92 U/L (ref 39–117)
Anion gap: 18 — ABNORMAL HIGH (ref 5–15)
BUN: 21 mg/dL (ref 6–23)
CALCIUM: 9.4 mg/dL (ref 8.4–10.5)
CO2: 25 mEq/L (ref 19–32)
Chloride: 98 mEq/L (ref 96–112)
Creatinine, Ser: 1.05 mg/dL (ref 0.50–1.35)
GFR calc non Af Amer: 76 mL/min — ABNORMAL LOW (ref >90)
GFR, EST AFRICAN AMERICAN: 88 mL/min — AB (ref >90)
GLUCOSE: 269 mg/dL — AB (ref 70–99)
Potassium: 4.2 mEq/L (ref 3.7–5.3)
Sodium: 141 mEq/L (ref 137–147)
TOTAL PROTEIN: 7.8 g/dL (ref 6.0–8.3)
Total Bilirubin: 0.5 mg/dL (ref 0.3–1.2)

## 2014-08-23 LAB — TROPONIN I

## 2014-08-23 LAB — PROTIME-INR
INR: 1.06 (ref 0.00–1.49)
Prothrombin Time: 13.9 seconds (ref 11.6–15.2)

## 2014-08-23 LAB — PRO B NATRIURETIC PEPTIDE: Pro B Natriuretic peptide (BNP): 20.4 pg/mL (ref 0–125)

## 2014-08-23 LAB — CBC
HCT: 35.6 % — ABNORMAL LOW (ref 39.0–52.0)
Hemoglobin: 12.3 g/dL — ABNORMAL LOW (ref 13.0–17.0)
MCH: 29 pg (ref 26.0–34.0)
MCHC: 34.6 g/dL (ref 30.0–36.0)
MCV: 84 fL (ref 78.0–100.0)
PLATELETS: 335 10*3/uL (ref 150–400)
RBC: 4.24 MIL/uL (ref 4.22–5.81)
RDW: 14.1 % (ref 11.5–15.5)
WBC: 7.5 10*3/uL (ref 4.0–10.5)

## 2014-08-23 LAB — MAGNESIUM: Magnesium: 1.7 mg/dL (ref 1.5–2.5)

## 2014-08-23 MED ORDER — MIDAZOLAM HCL 2 MG/2ML IJ SOLN
INTRAMUSCULAR | Status: AC
  Filled 2014-08-23: qty 2

## 2014-08-23 MED ORDER — QUETIAPINE FUMARATE 25 MG PO TABS: 25.0000 mg | ORAL_TABLET | Freq: Every morning | ORAL | Status: DC

## 2014-08-23 MED ORDER — QUETIAPINE FUMARATE 50 MG PO TABS
50.0000 mg | ORAL_TABLET | Freq: Every day | ORAL | Status: DC
Start: 2014-08-23 — End: 2014-12-29

## 2014-08-23 MED ORDER — QUETIAPINE FUMARATE 25 MG PO TABS
25.0000 mg | ORAL_TABLET | Freq: Once | ORAL | Status: AC
  Administered 2014-08-23: 25 mg via ORAL
  Filled 2014-08-23: qty 1

## 2014-08-23 MED ORDER — LORAZEPAM 2 MG/ML IJ SOLN: 1.0000 mg | Freq: Once | INTRAMUSCULAR | Status: DC

## 2014-08-23 MED ORDER — ASPIRIN 81 MG PO CHEW
324.0000 mg | CHEWABLE_TABLET | Freq: Once | ORAL | Status: AC
Start: 2014-08-23 — End: 2014-08-23

## 2014-08-23 MED ORDER — SODIUM CHLORIDE 0.9 % IV BOLUS (SEPSIS)
1000.0000 mL | Freq: Once | INTRAVENOUS | Status: AC
  Administered 2014-08-23: 1000 mL via INTRAVENOUS

## 2014-08-23 MED ORDER — FENTANYL CITRATE 0.05 MG/ML IJ SOLN
INTRAMUSCULAR | Status: DC
Start: 2014-08-23 — End: 2014-08-23
  Filled 2014-08-23: qty 2

## 2014-08-23 NOTE — ED Notes (Signed)
Patient ambulated with minimal assistance from EMS stretcher in hallway to bed.

## 2014-08-23 NOTE — Discharge Instructions (Signed)
As discussed, it is important that you follow up as soon as possible with your physician for continued management of your condition. ° °If you develop any new, or concerning changes in your condition, please return to the emergency department immediately. ° °

## 2014-08-23 NOTE — ED Notes (Addendum)
Per EMS: cp left sided sharp started 1 hour ago after eating 2 country ham and egg biscuits, pain is reproducible with palpation, pain alleviated with rest and relaxation, stroke in September, MI in the past.  Given 324 asa en route, tachy at 105-110 ST, afebrile at triage.   Patient has poor concentration, has to have questions repeated consistently.

## 2014-08-23 NOTE — ED Notes (Addendum)
Patient transported to CT/Xray. 

## 2014-08-23 NOTE — ED Provider Notes (Signed)
CSN: [id number]     Arrival date & time 08/23/14  0730 History   First MD Initiated Contact with Patient 08/23/14 0734     Chief Complaint  Patient presents with  . Chest Pain     (Consider location/radiation/quality/duration/timing/severity/associated sxs/prior Treatment) HPI  Patient presents from home with concerns of chest pain.  Pain began less than one hour prior to ED arrival. Patient states that he was eating breakfast, felt the sudden onset of pain in the left lower chest.  Since onset pain is sharp, severe, nonradiating.  No relief with aspirin provided by EMS providers in route. There is associated anxiety, dyspnea, no nausea, vomiting, other abdominal pain. Patient has persistent left-sided weakness from stroke 6 weeks ago. He states that this is unchanged. Per EMS the patient was normotensive en route, but tachycardic, anxious, perseverates. Patient provides all responses initially briefly, but has repetitive answers, and short-term memory lapses, and seems confused. He denies use of illicit substances since prior to his stroke.  Past Medical History  Diagnosis Date  . Diabetes mellitus   . Hypertension   . Gout   . MI (myocardial infarction)   . Chronic pain     right elbow  . Major depression, chronic   . Bipolar disorder   . Schizophrenia    Past Surgical History  Procedure Laterality Date  . Coronary angioplasty with stent placement      2008  . Knee surgery      both  . Tee without cardioversion N/A 07/26/2014    Procedure: TRANSESOPHAGEAL ECHOCARDIOGRAM (TEE);  Surgeon: Lars Masson, MD;  Location: [facility] ENDOSCOPY;  Service: Cardiovascular;  Laterality: N/A;   Family History  Problem Relation Age of Onset  . Diabetic kidney disease Mother   . Hypertension Mother   . Gout Mother   . Diabetic kidney disease Father   . Heart attack Brother 37   History  Substance Use Topics  . Smoking status: Former Smoker -- .1 years    Types: Cigarettes    Quit  date: 01/07/2013  . Smokeless tobacco: Never Used  . Alcohol Use: No    Review of Systems  Constitutional:       Per HPI, otherwise negative  HENT:       Per HPI, otherwise negative  Respiratory:       Per HPI, otherwise negative  Cardiovascular:       Per HPI, otherwise negative  Gastrointestinal: Negative for nausea and vomiting.  Endocrine:       Negative aside from HPI  Genitourinary:       Neg aside from HPI   Musculoskeletal:       Per HPI, otherwise negative  Skin: Negative.   Neurological: Positive for weakness and light-headedness. Negative for syncope and speech difficulty.  Psychiatric/Behavioral: Positive for confusion. The patient is nervous/anxious.       Allergies  Fish allergy; Tomato; and Lactose intolerance (gi)  Home Medications   Prior to Admission medications   Medication Sig Start Date End Date Taking? Authorizing Provider  amLODipine (NORVASC) 10 MG tablet Take 1 tablet (10 mg total) by mouth daily. 08/01/14   Ripudeep Jenna Luo, MD  ciprofloxacin (CIPRO) 500 MG tablet Take 1 tablet (500 mg total) by mouth 2 (two) times daily. X 1 day 08/01/14   Ripudeep Jenna Luo, MD  docusate sodium 100 MG CAPS Take 100 mg by mouth 2 (two) times daily. 08/01/14   Ripudeep Jenna Luo, MD  furosemide (LASIX) 40 MG  tablet Take 1 tablet (40 mg total) by mouth 2 (two) times daily. 08/01/14   Ripudeep Jenna LuoK Rai, MD  hydrALAZINE (APRESOLINE) 50 MG tablet Take 1 tablet (50 mg total) by mouth 3 (three) times daily. 08/01/14   Ripudeep Jenna LuoK Rai, MD  insulin aspart (NOVOLOG) 100 UNIT/ML injection Inject 0-9 Units into the skin 3 (three) times daily with meals. Sliding scale CBG 70 - 120: 0 units CBG 121 - 150: 1 unit,  CBG 151 - 200: 2 units,  CBG 201 - 250: 3 units,  CBG 251 - 300: 5 units,  CBG 301 - 350: 7 units,  CBG 351 - 400: 9 units   CBG > 400: 9 units and notify your MD 08/01/14   Ripudeep Jenna LuoK Rai, MD  insulin NPH-regular Human (NOVOLIN 70/30) (70-30) 100 UNIT/ML injection Inject 43 Units  into the skin 2 (two) times daily with a meal. 08/01/14   Ripudeep K Rai, MD  metoprolol tartrate (LOPRESSOR) 25 MG tablet Take 1 tablet (25 mg total) by mouth daily. 08/01/14   Ripudeep Jenna LuoK Rai, MD  Multiple Vitamin (MULTIVITAMIN WITH MINERALS) TABS tablet Take 1 tablet by mouth daily. 08/01/14   Ripudeep Jenna LuoK Rai, MD  pantoprazole (PROTONIX) 40 MG tablet Take 1 tablet (40 mg total) by mouth at bedtime. 08/01/14   Ripudeep Jenna LuoK Rai, MD  polyethylene glycol (MIRALAX / GLYCOLAX) packet Take 17 g by mouth daily as needed for moderate constipation. 08/01/14   Ripudeep Jenna LuoK Rai, MD  QUEtiapine (SEROQUEL) 25 MG tablet Take 1 tablet (25 mg total) by mouth every morning. 08/01/14   Ripudeep Jenna LuoK Rai, MD  QUEtiapine (SEROQUEL) 50 MG tablet Take 1 tablet (50 mg total) by mouth at bedtime. 08/01/14   Ripudeep Jenna LuoK Rai, MD   Pulse 110  Temp(Src) 97.7 F (36.5 C) (Oral)  Resp 22  Ht 5\' 6"  (1.676 m)  Wt 140 lb (63.504 kg)  BMI 22.61 kg/m2  SpO2 98% Physical Exam  Constitutional: He is oriented to person, place, and time. He has a sickly appearance.  Diaphoretic, uncomfortable appearing  HENT:  Head: Normocephalic and atraumatic.  Eyes: Conjunctivae and EOM are normal.  Cardiovascular: Normal rate and regular rhythm.   Pulmonary/Chest: Effort normal. No stridor. No respiratory distress.  Abdominal: He exhibits no distension.  Musculoskeletal: He exhibits no edema.  Neurological: He is alert and oriented to person, place, and time.  Speech is brief, clear.  No facial asymmetry.  Patient has 4/5 strength in the left upper extremity. A slight tremor.  Skin: Skin is warm and dry.  Psychiatric: His mood appears anxious. His speech is delayed and tangential. Cognition and memory are impaired. He exhibits abnormal recent memory.  Nursing note and vitals reviewed.   ED Course  Procedures (including critical care time) Labs Review Labs Reviewed  CBC  COMPREHENSIVE METABOLIC PANEL  PRO B NATRIURETIC PEPTIDE   MAGNESIUM  PROTIME-INR  TROPONIN I    Imaging Review No results found.   EKG Interpretation   Date/Time:  Tuesday August 23 2014 07:35:10 EST Ventricular Rate:  109 PR Interval:  157 QRS Duration: 85 QT Interval:  367 QTC Calculation: 494 R Axis:   61 Text Interpretation:  Sinus tachycardia Borderline prolonged QT interval  Sinus tachycardia Artifact Abnormal ekg Confirmed by Gerhard MunchLOCKWOOD, Jaeceon Michelin  MD  ([id number]) on 08/23/2014 7:52:07 AM       O2- 99% ra, nml Cardiac: 110 - st, abnormal  A review of the patient's chart demonstrates subarachnoid hemorrhage within the past 6  weeks.  12:31 PM Patient sitting upright in no distress.  He is eating lunch.  No active pain. Patient requests refill of his psychiatric medications, states that he has active auditory hallucination. He has no other new complaints. He will follow-up with behavioral health. MDM  Patient presents with chest pain, which resolved entirely in the emergency department. Patient has recent thorough evaluation, due to subarachnoid hemorrhage.  Given his recent intracranial bleed, patient is not a candidate for additional anticoagulant. Patient's evaluation here is largely reassuring, with no evidence for ischemia given negative troponin 2, nonischemic EKG, reassuring labs.  Low suspicion for occult ischemia or infection given physical exam, vital sign findings. Patient was discharged in stable condition to follow-up with primary care and psychiatry.     Gerhard Munchobert Taylor Spilde, MD 08/23/14 [id number]

## 2014-08-24 ENCOUNTER — Encounter (HOSPITAL_COMMUNITY): Payer: Self-pay | Admitting: *Deleted

## 2014-08-24 ENCOUNTER — Emergency Department (HOSPITAL_COMMUNITY): Payer: Medicaid Other

## 2014-08-24 ENCOUNTER — Emergency Department (HOSPITAL_COMMUNITY)
Admission: EM | Admit: 2014-08-24 | Discharge: 2014-08-24 | Disposition: A | Discharge status: Home / Self Care | Payer: Medicaid Other | Attending: Emergency Medicine | Admitting: Emergency Medicine

## 2014-08-24 DIAGNOSIS — Z87891 Personal history of nicotine dependence: Secondary | ICD-10-CM | POA: Diagnosis not present

## 2014-08-24 DIAGNOSIS — Z955 Presence of coronary angioplasty implant and graft: Secondary | ICD-10-CM | POA: Diagnosis not present

## 2014-08-24 DIAGNOSIS — S8001XA Contusion of right knee, initial encounter: Secondary | ICD-10-CM | POA: Insufficient documentation

## 2014-08-24 DIAGNOSIS — G8929 Other chronic pain: Secondary | ICD-10-CM | POA: Diagnosis not present

## 2014-08-24 DIAGNOSIS — R42 Dizziness and giddiness: Secondary | ICD-10-CM | POA: Diagnosis not present

## 2014-08-24 DIAGNOSIS — W19XXXA Unspecified fall, initial encounter: Secondary | ICD-10-CM

## 2014-08-24 DIAGNOSIS — Z79899 Other long term (current) drug therapy: Secondary | ICD-10-CM | POA: Insufficient documentation

## 2014-08-24 DIAGNOSIS — I252 Old myocardial infarction: Secondary | ICD-10-CM | POA: Diagnosis not present

## 2014-08-24 DIAGNOSIS — M1711 Unilateral primary osteoarthritis, right knee: Secondary | ICD-10-CM

## 2014-08-24 DIAGNOSIS — R5383 Other fatigue: Secondary | ICD-10-CM | POA: Diagnosis not present

## 2014-08-24 DIAGNOSIS — Z8669 Personal history of other diseases of the nervous system and sense organs: Secondary | ICD-10-CM | POA: Diagnosis not present

## 2014-08-24 DIAGNOSIS — I10 Essential (primary) hypertension: Secondary | ICD-10-CM | POA: Insufficient documentation

## 2014-08-24 DIAGNOSIS — Z794 Long term (current) use of insulin: Secondary | ICD-10-CM | POA: Insufficient documentation

## 2014-08-24 DIAGNOSIS — Z8673 Personal history of transient ischemic attack (TIA), and cerebral infarction without residual deficits: Secondary | ICD-10-CM | POA: Insufficient documentation

## 2014-08-24 DIAGNOSIS — Y9389 Activity, other specified: Secondary | ICD-10-CM | POA: Diagnosis not present

## 2014-08-24 DIAGNOSIS — Z9889 Other specified postprocedural states: Secondary | ICD-10-CM | POA: Insufficient documentation

## 2014-08-24 DIAGNOSIS — E119 Type 2 diabetes mellitus without complications: Secondary | ICD-10-CM | POA: Insufficient documentation

## 2014-08-24 DIAGNOSIS — W010XXA Fall on same level from slipping, tripping and stumbling without subsequent striking against object, initial encounter: Secondary | ICD-10-CM | POA: Insufficient documentation

## 2014-08-24 DIAGNOSIS — Y9289 Other specified places as the place of occurrence of the external cause: Secondary | ICD-10-CM | POA: Insufficient documentation

## 2014-08-24 DIAGNOSIS — S8991XA Unspecified injury of right lower leg, initial encounter: Secondary | ICD-10-CM | POA: Diagnosis present

## 2014-08-24 DIAGNOSIS — Z8709 Personal history of other diseases of the respiratory system: Secondary | ICD-10-CM | POA: Insufficient documentation

## 2014-08-24 HISTORY — DX: Cerebral infarction, unspecified: I63.9

## 2014-08-24 MED ORDER — ONDANSETRON HCL 4 MG PO TABS
4.0000 mg | ORAL_TABLET | Freq: Once | ORAL | Status: AC
  Administered 2014-08-24: 4 mg via ORAL
  Filled 2014-08-24: qty 1

## 2014-08-24 MED ORDER — HYDROCODONE-ACETAMINOPHEN 5-325 MG PO TABS
2.0000 | ORAL_TABLET | Freq: Once | ORAL | Status: AC
  Administered 2014-08-24: 2 via ORAL
  Filled 2014-08-24: qty 2

## 2014-08-24 MED ORDER — HYDROCODONE-ACETAMINOPHEN 5-325 MG PO TABS: 1.0000 | ORAL_TABLET | ORAL | Status: DC | PRN

## 2014-08-24 NOTE — ED Provider Notes (Signed)
CSN: [id number]     Arrival date & time 08/24/14  1039 History   First MD Initiated Contact with Patient 08/24/14 1113     Chief Complaint  Patient presents with  . Fall     (Consider location/radiation/quality/duration/timing/severity/associated sxs/prior Treatment) HPI Comments: Patient is a 58 year old male who presents to the emergency department with a complaint of right knee pain following a fall. The patient states that on September 26 he sustained" a stroke". The patient states that he was in the hospital until October 13. The patient states he continues to have some weakness, and unsteady gait. On last night he attempted to get up, he got dizzy, fell, and injured the right knee. The patient states he thought he felt a pop in the knee. It is of note that he has had surgery on both knees for patella tendon revisions. He presents now for evaluation of his right knee.  The history is provided by the patient.    Past Medical History  Diagnosis Date  . Diabetes mellitus   . Hypertension   . Gout   . MI (myocardial infarction)   . Chronic pain     right elbow  . Major depression, chronic   . Bipolar disorder   . Schizophrenia   . Subarachnoid hemorrhage september 2015  . Acute respiratory failure September 2015  . 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 Bacteremia   . Chronic pain   . Stroke    Past Surgical History  Procedure Laterality Date  . Coronary angioplasty with stent placement      2008  . Knee surgery      both  . Tee without cardioversion N/A 07/26/2014    Procedure: TRANSESOPHAGEAL ECHOCARDIOGRAM (TEE);  Surgeon: Lars MassonKatarina H Nelson, MD;  [facility] END[facility];  Service: Cardiovascular;  Laterality: N/A;   Family History  Problem Relation Age of Onset  . Diabetic kidney disease Mother   . Hypertension Mother   . Gout Mother   . Diabetic kidney disease Father   . Heart attack Brother 37   History  Substance Use  Topics  . Smoking status: Former Smoker -- .1 years    Types: Cigarettes    Quit date: 01/07/2013  . Smokeless tobacco: Never Used  . Alcohol Use: No     Comment: denies    Review of Systems  Constitutional: Negative for activity change.       All ROS Neg except as noted in HPI  Eyes: Negative for photophobia and discharge.  Respiratory: Negative for cough, shortness of breath and wheezing.   Cardiovascular: Negative for chest pain and palpitations.  Gastrointestinal: Negative for abdominal pain and blood in stool.  Genitourinary: Negative for dysuria, frequency and hematuria.  Musculoskeletal: Positive for arthralgias. Negative for back pain and neck pain.  Skin: Negative.   Neurological: Positive for dizziness and weakness. Negative for seizures and speech difficulty.  Psychiatric/Behavioral: Negative for hallucinations and confusion.      Allergies  Fish allergy; Oatmeal; Tomato; and Lactose intolerance (gi)  Home Medications   Prior to Admission medications   Medication Sig Start Date End Date Taking? Authorizing Provider  insulin NPH-regular Human (NOVOLIN 70/30) (70-30) 100 UNIT/ML injection Inject 43 Units into the skin 2 (two) times daily with a meal. 08/01/14  Yes Ripudeep K Rai, MD  amLODipine (NORVASC) 10 MG tablet Take 1 tablet (10 mg total) by mouth daily. 08/01/14   Ripudeep Jenna LuoK Rai, MD  docusate sodium 100 MG CAPS Take 100 mg by mouth 2 (two) times daily. 08/01/14   Ripudeep Jenna Luo, MD  furosemide (LASIX) 40 MG tablet Take 1 tablet (40 mg total) by mouth 2 (two) times daily. 08/01/14   Ripudeep Jenna Luo, MD  hydrALAZINE (APRESOLINE) 50 MG tablet Take 1 tablet (50 mg total) by mouth 3 (three) times daily. 08/01/14   Ripudeep Jenna Luo, MD  insulin aspart (NOVOLOG) 100 UNIT/ML injection Inject 0-9 Units into the skin 3 (three) times daily with meals. Sliding scale CBG 70 - 120: 0 units CBG 121 - 150: 1 unit,  CBG 151 - 200: 2 units,  CBG 201 - 250: 3 units,  CBG 251 - 300: 5  units,  CBG 301 - 350: 7 units,  CBG 351 - 400: 9 units   CBG > 400: 9 units and notify your MD 08/01/14   Ripudeep Jenna Luo, MD  metoprolol tartrate (LOPRESSOR) 25 MG tablet Take 1 tablet (25 mg total) by mouth daily. 08/01/14   Ripudeep Jenna Luo, MD  Multiple Vitamin (MULTIVITAMIN WITH MINERALS) TABS tablet Take 1 tablet by mouth daily. 08/01/14   Ripudeep Jenna Luo, MD  pantoprazole (PROTONIX) 40 MG tablet Take 1 tablet (40 mg total) by mouth at bedtime. 08/01/14   Ripudeep Jenna Luo, MD  polyethylene glycol (MIRALAX / GLYCOLAX) packet Take 17 g by mouth daily as needed for moderate constipation. 08/01/14   Ripudeep Jenna Luo, MD  QUEtiapine (SEROQUEL) 25 MG tablet Take 1 tablet (25 mg total) by mouth every morning. 08/23/14   Gerhard Munch, MD  QUEtiapine (SEROQUEL) 50 MG tablet Take 1 tablet (50 mg total) by mouth at bedtime. 08/23/14   Gerhard Munch, MD   BP 176/93 mmHg  Pulse 93  Temp(Src) 98 F (36.7 C) (Oral)  Resp 20  Ht 5\' 6"  (1.676 m)  Wt 148 lb (67.132 kg)  BMI 23.90 kg/m2  SpO2 100% Physical Exam  Constitutional: He is oriented to person, place, and time. He appears well-developed and well-nourished.  Non-toxic appearance.  HENT:  Head: Normocephalic.  Right Ear: Tympanic membrane and external ear normal.  Left Ear: Tympanic membrane and external ear normal.  Eyes: EOM and lids are normal. Pupils are equal, round, and reactive to light.  Neck: Normal range of motion. Neck supple. Carotid bruit is not present.  Cardiovascular: Normal rate, regular rhythm, normal heart sounds, intact distal pulses and normal pulses.   Pulmonary/Chest: Breath sounds normal. No respiratory distress.  Abdominal: Soft. Bowel sounds are normal. There is no tenderness. There is no guarding.  Musculoskeletal: Normal range of motion.  There are well-healed surgical scars on the anterior right and left knee. There is pain to palpation of the right knee, particularly around the patella area. There is also some medial  tenderness to palpation. There is no significant effusion appreciated. The right knee nor the left knee are hot to touch.  There is no deformity, or hematoma, or tenderness involving the right hip.  The dorsalis pedis pulses are 2+ bilaterally. The Achilles tendon is intact on the right.  Lymphadenopathy:       Head (right side): No submandibular adenopathy present.       Head (left side): No submandibular adenopathy present.    He has no cervical adenopathy.  Neurological: He is alert and oriented to person, place, and time. He has normal strength. No cranial nerve deficit or sensory deficit. Coordination normal.  Patient using the phone upon my arrival to the  room. No problems with fine motor coordination.  Skin: Skin is warm and dry.  Psychiatric: He has a normal mood and affect. His speech is normal.  Nursing note and vitals reviewed.   ED Course  Procedures (including critical care time) Labs Review Labs Reviewed - No data to display  Imaging Review Dg Chest 2 View  08/23/2014   CLINICAL DATA:  Chest pain.  EXAM: CHEST  2 VIEW  COMPARISON:  07/25/2014  FINDINGS: Normal heart size and mediastinal contours. Remote posterior left sixth and seventh rib fractures. No acute infiltrate or edema. No effusion or pneumothorax.  IMPRESSION: No active cardiopulmonary disease.   Electronically Signed   By: Tiburcio PeaJonathan  Watts M.D.   On: 08/23/2014 08:43   Ct Head Wo Contrast  08/23/2014   CLINICAL DATA:  Altered mental status.  EXAM: CT HEAD WITHOUT CONTRAST  TECHNIQUE: Contiguous axial images were obtained from the base of the skull through the vertex without intravenous contrast.  COMPARISON:  CT scan of July 20, 2014.  FINDINGS: Bony calvarium appears intact. Mild chronic ischemic white matter disease is noted. Old small lacunar infarction is seen in the left cerebellar hemisphere. No mass effect or midline shift is noted. Ventricular size is within normal limits. There is no evidence of mass  lesion, hemorrhage or acute infarction.  IMPRESSION: No acute intracranial abnormality seen.   Electronically Signed   By: Roque LiasJames  Green M.D.   On: 08/23/2014 08:39   Dg Knee Complete 4 Views Right  08/24/2014   CLINICAL DATA:  Acute injury of the right knee; history of previous injury in 1986  EXAM: RIGHT KNEE - COMPLETE 4+ VIEW  COMPARISON:  None.  FINDINGS: The bones of the knee are adequately mineralized. There is beaking of the tibial spines. There is mild narrowing of the medial and lateral joint compartments. There are calcifications within the patellar tendon or anterior aspect of the synovial space. The patella itself is intact but is superiorly positioned possibly indicating old patellar tendon injury. There is no acute fracture. No joint effusion is evident. There are vascular calcifications.  IMPRESSION: There are mild degenerative changes of all 3 joint compartments. There is likely a chronic injury of the patellar tendon with resultant calcification. Positioning of the patella is somewhat superior as well.  If the patient's symptoms persist, follow-up MRI may be useful.   Electronically Signed   By: David  SwazilandJordan   On: 08/24/2014 11:46     EKG Interpretation None      MDM  The patient had a CT scan on yesterday which showed no acute intracranial abnormalities. I reviewed the CT.  Films of the right knee were reviewed showing the patella somewhat superior to its usual position, however the patient has had patella tendon surgery on both knees in the past. There is no fracture or dislocation appreciated. There is degenerative joint disease changes in all 3 joint compartments.  Suspect the patient sustained a contusion to the right knee that aggravated degenerative joint disease changes.  Rx for norco given for discomfort.  Pt request crutches to use. Permission given, but ask pt to see his primary MD for additonal evaluation.   Final diagnoses:  Contusion, knee, right, initial  encounter  Primary osteoarthritis of right knee    **I have reviewed nursing notes, vital signs, and all appropriate lab and imaging results for this patient.Kathie Dike*    Lisabeth Mian M Annebelle Bostic, PA-C 08/24/14 1213  Juliet RudeNathan R. Rubin PayorPickering, MD 08/26/14 2151

## 2014-08-24 NOTE — Discharge Instructions (Signed)
The x-ray of your right knee reveals arthritis changes, but no fracture or dislocation. Please use your walker and use caution when up and about, especially since you have a problem with your balance. Please see your primary physician for additional evaluation of your knee and your balance. Norco may cause drowsiness, please use with caution. Arthritis, Nonspecific Arthritis is pain, redness, warmth, or puffiness (inflammation) of a joint. The joint may be stiff or hurt when you move it. One or more joints may be affected. There are many types of arthritis. Your doctor may not know what type you have right away. The most common cause of arthritis is wear and tear on the joint (osteoarthritis). HOME CARE   Only take medicine as told by your doctor.  Rest the joint as much as possible.  Raise (elevate) your joint if it is puffy.  Use crutches if the painful joint is in your leg.  Drink enough fluids to keep your pee (urine) clear or pale yellow.  Follow your doctor's diet instructions.  Use cold packs for very bad joint pain for 10 to 15 minutes every hour. Ask your doctor if it is okay for you to use hot packs.  Exercise as told by your doctor.  Take a warm shower if you have stiffness in the morning.  Move your sore joints throughout the day. GET HELP RIGHT AWAY IF:   You have a fever.  You have very bad joint pain, puffiness, or redness.  You have many joints that are painful and puffy.  You are not getting better with treatment.  You have very bad back pain or leg weakness.  You cannot control when you poop (bowel movement) or pee (urinate).  You do not feel better in 24 hours or are getting worse.  You are having side effects from your medicine. MAKE SURE YOU:   Understand these instructions.  Will watch your condition.  Will get help right away if you are not doing well or get worse. Document Released: 01/01/2010 Document Revised: 04/07/2012 Document Reviewed:  01/01/2010 [facility] Patient Information 2015 [place], [place]. This information is not intended to replace advice given to you by your health care provider. Make sure you discuss any questions you have with your health care provider.  Contusion A contusion is a deep bruise. Contusions happen when an injury causes bleeding under the skin. Signs of bruising include pain, puffiness (swelling), and discolored skin. The contusion may turn blue, purple, or yellow. HOME CARE   Put ice on the injured area.  Put ice in a plastic bag.  Place a towel between your skin and the bag.  Leave the ice on for 15-20 minutes, 03-04 times a day.  Only take medicine as told by your doctor.  Rest the injured area.  If possible, raise (elevate) the injured area to lessen puffiness. GET HELP RIGHT AWAY IF:   You have more bruising or puffiness.  You have pain that is getting worse.  Your puffiness or pain is not helped by medicine. MAKE SURE YOU:   Understand these instructions.  Will watch your condition.  Will get help right away if you are not doing well or get worse. Document Released: 03/25/2008 Document Revised: 12/30/2011 Document Reviewed: 08/12/2011 [facility] MeetingExitCare Patient Information 2015 [place], [place]. This information is not intended to replace advice given to you by your health care provider. Make sure you discuss any questions you have with your health care provider.

## 2014-08-24 NOTE — ED Notes (Signed)
Pt requested crutches for use at home to help mobility, [facility] called for transportation back home

## 2014-08-24 NOTE — ED Notes (Signed)
Pt lost his balance is not new since CVA 07/16/14, landed on right knee, c/o pain to knee, denies hitting head

## 2014-08-24 NOTE — ED Notes (Addendum)
Pt fell last night and injured rt knee, felt a pop in his rt knee. Pt has residual dizziness and rt sided weakness from recent CVA. [facility] Oct 13th, has not had follow-up care, still has blurry vision, rt sided weakness, and unsteady gait per pt. Had prior knee surgery in 1986 on both knees, states feels the same pain as prior injury.

## 2014-09-16 ENCOUNTER — Emergency Department (HOSPITAL_COMMUNITY): Payer: Medicaid Other

## 2014-09-16 ENCOUNTER — Encounter (HOSPITAL_COMMUNITY): Payer: Self-pay | Admitting: *Deleted

## 2014-09-16 ENCOUNTER — Emergency Department (HOSPITAL_COMMUNITY)
Admission: EM | Admit: 2014-09-16 | Discharge: 2014-09-17 | Disposition: A | Discharge status: Home / Self Care | Payer: Medicaid Other | Attending: Emergency Medicine | Admitting: Emergency Medicine

## 2014-09-16 DIAGNOSIS — Z87891 Personal history of nicotine dependence: Secondary | ICD-10-CM | POA: Insufficient documentation

## 2014-09-16 DIAGNOSIS — E119 Type 2 diabetes mellitus without complications: Secondary | ICD-10-CM | POA: Insufficient documentation

## 2014-09-16 DIAGNOSIS — F329 Major depressive disorder, single episode, unspecified: Secondary | ICD-10-CM | POA: Insufficient documentation

## 2014-09-16 DIAGNOSIS — M1000 Idiopathic gout, unspecified site: Secondary | ICD-10-CM

## 2014-09-16 DIAGNOSIS — Z794 Long term (current) use of insulin: Secondary | ICD-10-CM | POA: Diagnosis not present

## 2014-09-16 DIAGNOSIS — R079 Chest pain, unspecified: Secondary | ICD-10-CM

## 2014-09-16 DIAGNOSIS — Z8673 Personal history of transient ischemic attack (TIA), and cerebral infarction without residual deficits: Secondary | ICD-10-CM | POA: Diagnosis not present

## 2014-09-16 DIAGNOSIS — R739 Hyperglycemia, unspecified: Secondary | ICD-10-CM

## 2014-09-16 DIAGNOSIS — E871 Hypo-osmolality and hyponatremia: Secondary | ICD-10-CM | POA: Diagnosis not present

## 2014-09-16 DIAGNOSIS — G8929 Other chronic pain: Secondary | ICD-10-CM | POA: Insufficient documentation

## 2014-09-16 DIAGNOSIS — Z8709 Personal history of other diseases of the respiratory system: Secondary | ICD-10-CM | POA: Insufficient documentation

## 2014-09-16 DIAGNOSIS — I10 Essential (primary) hypertension: Secondary | ICD-10-CM | POA: Insufficient documentation

## 2014-09-16 DIAGNOSIS — I252 Old myocardial infarction: Secondary | ICD-10-CM | POA: Diagnosis not present

## 2014-09-16 DIAGNOSIS — Z79899 Other long term (current) drug therapy: Secondary | ICD-10-CM | POA: Diagnosis not present

## 2014-09-16 DIAGNOSIS — M25522 Pain in left elbow: Secondary | ICD-10-CM | POA: Diagnosis present

## 2014-09-16 LAB — CBC WITH DIFFERENTIAL/PLATELET
Band Neutrophils: 0 % (ref 0–10)
Basophils Absolute: 0 10*3/uL (ref 0.0–0.1)
Basophils Relative: 0 % (ref 0–1)
Blasts: 0 %
EOS ABS: 0.1 10*3/uL (ref 0.0–0.7)
EOS PCT: 1 % (ref 0–5)
HCT: 32.7 % — ABNORMAL LOW (ref 39.0–52.0)
Hemoglobin: 11.3 g/dL — ABNORMAL LOW (ref 13.0–17.0)
Lymphocytes Relative: 20 % (ref 12–46)
Lymphs Abs: 2 10*3/uL (ref 0.7–4.0)
MCH: 28.8 pg (ref 26.0–34.0)
MCHC: 34.6 g/dL (ref 30.0–36.0)
MCV: 83.4 fL (ref 78.0–100.0)
MONO ABS: 0.7 10*3/uL (ref 0.1–1.0)
MONOS PCT: 7 % (ref 3–12)
Metamyelocytes Relative: 0 %
Myelocytes: 0 %
NEUTROS PCT: 72 % (ref 43–77)
NRBC: 0 /100{WBCs}
Neutro Abs: 7.3 10*3/uL (ref 1.7–7.7)
Platelets: 330 10*3/uL (ref 150–400)
Promyelocytes Absolute: 0 %
RBC: 3.92 MIL/uL — AB (ref 4.22–5.81)
RDW: 14.4 % (ref 11.5–15.5)
WBC: 10.1 10*3/uL (ref 4.0–10.5)

## 2014-09-16 LAB — BASIC METABOLIC PANEL
Anion gap: 18 — ABNORMAL HIGH (ref 5–15)
BUN: 18 mg/dL (ref 6–23)
CHLORIDE: 90 meq/L — AB (ref 96–112)
CO2: 24 mEq/L (ref 19–32)
Calcium: 9.3 mg/dL (ref 8.4–10.5)
Creatinine, Ser: 0.91 mg/dL (ref 0.50–1.35)
Glucose, Bld: 405 mg/dL — ABNORMAL HIGH (ref 70–99)
Potassium: 3.4 mEq/L — ABNORMAL LOW (ref 3.7–5.3)
Sodium: 132 mEq/L — ABNORMAL LOW (ref 137–147)

## 2014-09-16 LAB — CBG MONITORING, ED: Glucose-Capillary: 369 mg/dL — ABNORMAL HIGH (ref 70–99)

## 2014-09-16 LAB — TROPONIN I: Troponin I: <0.3 ng/mL (ref <0.30)

## 2014-09-16 MED ORDER — PREDNISONE 50 MG PO TABS
60.0000 mg | ORAL_TABLET | Freq: Once | ORAL | Status: AC
  Administered 2014-09-16: 60 mg via ORAL
  Filled 2014-09-16 (×2): qty 1

## 2014-09-16 MED ORDER — OXYCODONE-ACETAMINOPHEN 5-325 MG PO TABS
1.0000 | ORAL_TABLET | Freq: Once | ORAL | Status: AC
  Administered 2014-09-16: 1 via ORAL
  Filled 2014-09-16: qty 1

## 2014-09-16 NOTE — ED Provider Notes (Signed)
CSN: [id number]     Arrival date & time 09/16/14  2115 History   First MD Initiated Contact with Patient 09/16/14 2314     Chief Complaint  Patient presents with  . Gout  . Chest Pain     (Consider location/radiation/quality/duration/timing/severity/associated sxs/prior Treatment) Patient is a 58 y.o. male presenting with chest pain. The history is provided by the patient.  Chest Pain He has a history of gout and has noted pain in his left elbow started 3 days ago. Pain then moved to his left hand and thumb and got much worse. He started having swelling in his left hand 2 days ago. This is typical of his attacks of gout. He relates that he had been hospitalized for rehabilitation following a subarachnoid hemorrhage and he had not been given his colchicine or allopurinol while going to rehabilitation because there was concern that it would interact with some of his other medications. He did have some day chest pain today which has resolved. This is not unusual when he has attacks of gout. He states the pain was very different from what he had with previous heart attacks. He rates the pain in his hands at 8/10. Pain is worse with any movement or palpation. Nothing makes it better. Of note, he does have a history of cocaine abuse but states he has not used any cocaine since his recent subarachnoid hemorrhage.  Past Medical History  Diagnosis Date  . Diabetes mellitus   . Hypertension   . Gout   . MI (myocardial infarction)   . Chronic pain     right elbow  . Major depression, chronic   . Bipolar disorder   . Schizophrenia   . Subarachnoid hemorrhage september 2015  . Acute respiratory failure September 2015  . 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 Bacteremia   . Chronic pain   . Stroke    Past Surgical History  Procedure Laterality Date  . Coronary angioplasty with stent placement      2008  . Knee surgery      both  . Tee without  cardioversion N/A 07/26/2014    Procedure: TRANSESOPHAGEAL ECHOCARDIOGRAM (TEE);  Surgeon: Lars MassonKatarina H Nelson, MD;  Location: [facility];  Service: Cardiovascular;  Laterality: N/A;   Family History  Problem Relation Age of Onset  . Diabetic kidney disease Mother   . Hypertension Mother   . Gout Mother   . Diabetic kidney disease Father   . Heart attack Brother 37   History  Substance Use Topics  . Smoking status: Former Smoker -- .1 years    Types: Cigarettes    Quit date: 01/07/2013  . Smokeless tobacco: Never Used  . Alcohol Use: No     Comment: denies    Review of Systems  Cardiovascular: Positive for chest pain.  All other systems reviewed and are negative.     Allergies  Fish allergy; Oatmeal; Tomato; and Lactose intolerance (gi)  Home Medications   Prior to Admission medications   Medication Sig Start Date End Date Taking? Authorizing Provider  amLODipine (NORVASC) 10 MG tablet Take 1 tablet (10 mg total) by mouth daily. 08/01/14   Ripudeep Jenna LuoK Rai, MD  docusate sodium 100 MG CAPS Take 100 mg by mouth 2 (two) times daily. 08/01/14   Ripudeep Jenna LuoK Rai, MD  furosemide (LASIX) 40 MG tablet Take 1 tablet (40 mg total) by mouth 2 (two) times daily. 08/01/14   Ripudeep  Jenna LuoK Rai, MD  hydrALAZINE (APRESOLINE) 50 MG tablet Take 1 tablet (50 mg total) by mouth 3 (three) times daily. 08/01/14   Ripudeep Jenna LuoK Rai, MD  HYDROcodone-acetaminophen (NORCO/VICODIN) 5-325 MG per tablet Take 1 tablet by mouth every 4 (four) hours as needed. 08/24/14   Kathie DikeHobson M Bryant, PA-C  insulin aspart (NOVOLOG) 100 UNIT/ML injection Inject 0-9 Units into the skin 3 (three) times daily with meals. Sliding scale CBG 70 - 120: 0 units CBG 121 - 150: 1 unit,  CBG 151 - 200: 2 units,  CBG 201 - 250: 3 units,  CBG 251 - 300: 5 units,  CBG 301 - 350: 7 units,  CBG 351 - 400: 9 units   CBG > 400: 9 units and notify your MD 08/01/14   Ripudeep Jenna LuoK Rai, MD  insulin NPH-regular Human (NOVOLIN 70/30) (70-30) 100 UNIT/ML  injection Inject 43 Units into the skin 2 (two) times daily with a meal. 08/01/14   Ripudeep K Rai, MD  metoprolol tartrate (LOPRESSOR) 25 MG tablet Take 1 tablet (25 mg total) by mouth daily. 08/01/14   Ripudeep Jenna LuoK Rai, MD  Multiple Vitamin (MULTIVITAMIN WITH MINERALS) TABS tablet Take 1 tablet by mouth daily. 08/01/14   Ripudeep Jenna LuoK Rai, MD  pantoprazole (PROTONIX) 40 MG tablet Take 1 tablet (40 mg total) by mouth at bedtime. 08/01/14   Ripudeep Jenna LuoK Rai, MD  polyethylene glycol (MIRALAX / GLYCOLAX) packet Take 17 g by mouth daily as needed for moderate constipation. 08/01/14   Ripudeep Jenna LuoK Rai, MD  QUEtiapine (SEROQUEL) 25 MG tablet Take 1 tablet (25 mg total) by mouth every morning. 08/23/14   Gerhard Munchobert Lockwood, MD  QUEtiapine (SEROQUEL) 50 MG tablet Take 1 tablet (50 mg total) by mouth at bedtime. 08/23/14   Gerhard Munchobert Lockwood, MD   BP 164/86 mmHg  Pulse 103  Temp(Src) 98.7 F (37.1 C) (Oral)  Resp 18  Ht 5\' 6"  (1.676 m)  Wt 155 lb (70.308 kg)  BMI 25.03 kg/m2  SpO2 99% Physical Exam  Nursing note and vitals reviewed.  58 year old male, resting comfortably and in no acute distress. Vital signs are significant for hypertension and mild tachycardia. Oxygen saturation is 99%, which is normal. Head is normocephalic and atraumatic. PERRLA, EOMI. Oropharynx is clear. Neck is nontender and supple without adenopathy or JVD. Back is nontender and there is no CVA tenderness. Lungs are clear without rales, wheezes, or rhonchi. Chest is nontender. Heart has regular rate and rhythm without murmur. Abdomen is soft, flat, nontender without masses or hepatosplenomegaly and peristalsis is normoactive. Extremities: There is erythema, warmth, swelling in the left wrist and thumb over the first MCP joint. There is marked tenderness over these areas. Overall picture is consistent with gout. Skin is warm and dry without rash. Neurologic: Mental status is normal, cranial nerves are intact, there are no motor or sensory  deficits.  ED Course  Procedures (including critical care time) Labs Review Results for orders placed or performed during the hospital encounter of 09/16/14  CBC with Differential  Result Value Ref Range   WBC 10.1 4.0 - 10.5 K/uL   RBC 3.92 (L) 4.22 - 5.81 MIL/uL   Hemoglobin 11.3 (L) 13.0 - 17.0 g/dL   HCT 16.132.7 (L) 09.639.0 - 04.552.0 %   MCV 83.4 78.0 - 100.0 fL   MCH 28.8 26.0 - 34.0 pg   MCHC 34.6 30.0 - 36.0 g/dL   RDW 40.914.4 81.111.5 - 91.415.5 %   Platelets 330 150 - 400 K/uL  Neutrophils Relative % 72 43 - 77 %   Lymphocytes Relative 20 12 - 46 %   Monocytes Relative 7 3 - 12 %   Eosinophils Relative 1 0 - 5 %   Basophils Relative 0 0 - 1 %   Band Neutrophils 0 0 - 10 %   Metamyelocytes Relative 0 %   Myelocytes 0 %   Promyelocytes Absolute 0 %   Blasts 0 %   nRBC 0 0 /100 WBC   Neutro Abs 7.3 1.7 - 7.7 K/uL   Lymphs Abs 2.0 0.7 - 4.0 K/uL   Monocytes Absolute 0.7 0.1 - 1.0 K/uL   Eosinophils Absolute 0.1 0.0 - 0.7 K/uL   Basophils Absolute 0.0 0.0 - 0.1 K/uL  Basic metabolic panel  Result Value Ref Range   Sodium 132 (L) 137 - 147 mEq/L   Potassium 3.4 (L) 3.7 - 5.3 mEq/L   Chloride 90 (L) 96 - 112 mEq/L   CO2 24 19 - 32 mEq/L   Glucose, Bld 405 (H) 70 - 99 mg/dL   BUN 18 6 - 23 mg/dL   Creatinine, Ser 1.61 0.50 - 1.35 mg/dL   Calcium 9.3 8.4 - 09.6 mg/dL   GFR calc non Af Amer >90 >90 mL/min   GFR calc Af Amer >90 >90 mL/min   Anion gap 18 (H) 5 - 15  Troponin I  Result Value Ref Range   Troponin I <0.30 <0.30 ng/mL  POC CBG, ED  Result Value Ref Range   Glucose-Capillary 369 (H) 70 - 99 mg/dL    EKG Interpretation   Date/Time:  Friday September 16 2014 21:23:32 EST Ventricular Rate:  118 PR Interval:  154 QRS Duration: 88 QT Interval:  330 QTC Calculation: 462 R Axis:   31 Text Interpretation:  Sinus tachycardia Moderate voltage criteria for LVH,  may be normal variant Nonspecific T wave abnormality Abnormal ECG When  compared with ECG of 08/23/2014, QT has  shortened Confirmed by [facility]  MD,  Eain Mullendore ([id number]) on 09/16/2014 11:15:39 PM      MDM   Final diagnoses:  Acute idiopathic gout, unspecified site  Hyperglycemia  Hyponatremia    Acute gouty arthritis of the left hand and wrist. Atypical chest pain. Old records are reviewed and he does have a history of elevated uric acid. He has several hospitalizations for chest pain which were felt to be noncardiac although there is history of stent in the remote past. Recent subarachnoid hemorrhage was felt to be secondary to cocaine use. He had been on allopurinol in the past. Allopurinol should not be restarted during an acute gout flare. He will be given prednisone and discharged with prescriptions for prednisone and colchicine. He'll be given prescription for oxycodone acetaminophen for pain. His record on the [place] control substance reporting website was reviewed and he has only 1 narcotic prescription in the last 6 months.  Workup is significant only for elevated glucose. Because of this, it is decided not to give him any additional steroid spread to give him a prescription for colchicine and oxycodone-acetaminophen. He is not to start back on allopurinol until this attack has subsided.  Dione Booze, MD 09/17/14 [phone]

## 2014-09-16 NOTE — ED Notes (Signed)
Pt states he has gout in his left wrist, left elbow, right knee, and left ankle. Pt states when his gout flares up like this, he has chest pains. Pt also says his blood sugar is high.

## 2014-09-17 MED ORDER — OXYCODONE-ACETAMINOPHEN 5-325 MG PO TABS: 1.0000 | ORAL_TABLET | ORAL | Status: DC | PRN

## 2014-09-17 MED ORDER — COLCHICINE 0.6 MG PO TABS: 0.6000 mg | ORAL_TABLET | Freq: Two times a day (BID) | ORAL | Status: DC

## 2014-09-17 NOTE — Discharge Instructions (Signed)
Gout Gout is an inflammatory arthritis caused by a buildup of uric acid crystals in the joints. Uric acid is a chemical that is normally present in the blood. When the level of uric acid in the blood is too high it can form crystals that deposit in your joints and tissues. This causes joint redness, soreness, and swelling (inflammation). Repeat attacks are common. Over time, uric acid crystals can form into masses (tophi) near a joint, destroying bone and causing disfigurement. Gout is treatable and often preventable. CAUSES  The disease begins with elevated levels of uric acid in the blood. Uric acid is produced by your body when it breaks down a naturally found substance called purines. Certain foods you eat, such as meats and fish, contain high amounts of purines. Causes of an elevated uric acid level include:  Being passed down from parent to child (heredity).  Diseases that cause increased uric acid production (such as obesity, psoriasis, and certain cancers).  Excessive alcohol use.  Diet, especially diets rich in meat and seafood.  Medicines, including certain cancer-fighting medicines (chemotherapy), water pills (diuretics), and aspirin.  Chronic kidney disease. The kidneys are no longer able to remove uric acid well.  Problems with metabolism. Conditions strongly associated with gout include:  Obesity.  High blood pressure.  High cholesterol.  Diabetes. Not everyone with elevated uric acid levels gets gout. It is not understood why some people get gout and others do not. Surgery, joint injury, and eating too much of certain foods are some of the factors that can lead to gout attacks. SYMPTOMS   An attack of gout comes on quickly. It causes intense pain with redness, swelling, and warmth in a joint.  Fever can occur.  Often, only one joint is involved. Certain joints are more commonly involved:  Base of the big toe.  Knee.  Ankle.  Wrist.  Finger. Without  treatment, an attack usually goes away in a few days to weeks. Between attacks, you usually will not have symptoms, which is different from many other forms of arthritis. DIAGNOSIS  Your caregiver will suspect gout based on your symptoms and exam. In some cases, tests may be recommended. The tests may include:  Blood tests.  Urine tests.  X-rays.  Joint fluid exam. This exam requires a needle to remove fluid from the joint (arthrocentesis). Using a microscope, gout is confirmed when uric acid crystals are seen in the joint fluid. TREATMENT  There are two phases to gout treatment: treating the sudden onset (acute) attack and preventing attacks (prophylaxis).  Treatment of an Acute Attack.  Medicines are used. These include anti-inflammatory medicines or steroid medicines.  An injection of steroid medicine into the affected joint is sometimes necessary.  The painful joint is rested. Movement can worsen the arthritis.  You may use warm or cold treatments on painful joints, depending which works best for you.  Treatment to Prevent Attacks.  If you suffer from frequent gout attacks, your caregiver may advise preventive medicine. These medicines are started after the acute attack subsides. These medicines either help your kidneys eliminate uric acid from your body or decrease your uric acid production. You may need to stay on these medicines for a very long time.  The early phase of treatment with preventive medicine can be associated with an increase in acute gout attacks. For this reason, during the first few months of treatment, your caregiver may also advise you to take medicines usually used for acute gout treatment. Be sure you  understand your caregiver's directions. Your caregiver may make several adjustments to your medicine dose before these medicines are effective.  Discuss dietary treatment with your caregiver or dietitian. Alcohol and drinks high in sugar and fructose and foods  such as meat, poultry, and seafood can increase uric acid levels. Your caregiver or dietitian can advise you on drinks and foods that should be limited. HOME CARE INSTRUCTIONS   Do not take aspirin to relieve pain. This raises uric acid levels.  Only take over-the-counter or prescription medicines for pain, discomfort, or fever as directed by your caregiver.  Rest the joint as much as possible. When in bed, keep sheets and blankets off painful areas.  Keep the affected joint raised (elevated).  Apply warm or cold treatments to painful joints. Use of warm or cold treatments depends on which works best for you.  Use crutches if the painful joint is in your leg.  Drink enough fluids to keep your urine clear or pale yellow. This helps your body get rid of uric acid. Limit alcohol, sugary drinks, and fructose drinks.  Follow your dietary instructions. Pay careful attention to the amount of protein you eat. Your daily diet should emphasize fruits, vegetables, whole grains, and fat-free or low-fat milk products. Discuss the use of coffee, vitamin C, and cherries with your caregiver or dietitian. These may be helpful in lowering uric acid levels.  Maintain a healthy body weight. SEEK MEDICAL CARE IF:   You develop diarrhea, vomiting, or any side effects from medicines.  You do not feel better in 24 hours, or you are getting worse. SEEK IMMEDIATE MEDICAL CARE IF:   Your joint becomes suddenly more tender, and you have chills or a fever. MAKE SURE YOU:   Understand these instructions.  Will watch your condition.  Will get help right away if you are not doing well or get worse. Document Released: 10/04/2000 Document Revised: 02/21/2014 Document Reviewed: 05/20/2012 [facility] Patient Information 2015 [employer] [place]. This information is not intended to replace advice given to you by your health care provider. Make sure you discuss any questions you have with your health care  provider.  Colchicine tablets or capsules What is this medicine? COLCHICINE (KOL chi seen) is for joint pain and swelling due to attacks of acute gouty arthritis. The medicine is also used to treat familial Mediterranean fever. This medicine may be used for other purposes; ask your health care provider or pharmacist if you have questions. COMMON BRAND NAME(S): Colcrys, MITIGARE What should I tell my health care provider before I take this medicine? They need to know if you have any of these conditions: -anemia -blood disorders like leukemia or lymphoma -heart disease -immune system problems -intestinal disease -kidney disease -liver disease -muscle pain or weakness -take other medicines -stomach problems -an unusual or allergic reaction to colchicine, other medicines, lactose, foods, dyes, or preservatives -pregnant or trying to get pregnant -breast-feeding How should I use this medicine? Take this medicine by mouth with a full glass of water. Follow the directions on the prescription label. You can take it with or without food. If it upsets your stomach, take it with food. Take your medicine at regular intervals. Do not take your medicine more often than directed. A special MedGuide will be given to you by the pharmacist with each prescription and refill. Be sure to read this information carefully each time. Talk to your pediatrician regarding the use of this medicine in children. While this drug may be prescribed for children  as young as 74 years old for selected conditions, precautions do apply. Patients over 58 years old may have a stronger reaction and need a smaller dose. Overdosage: If you think you have taken too much of this medicine contact a poison control center or emergency room at once. NOTE: This medicine is only for you. Do not share this medicine with others. What if I miss a dose? If you miss a dose, take it as soon as you can. If it is almost time for your next dose,  take only that dose. Do not take double or extra doses. What may interact with this medicine? Do not take this medicine with any of the following medications: -certain medicines for fungal infections like itraconazole This medicine may also interact with the following medications: -alcohol -certain medicines for cholesterol like atorvastatin -certain medicines for coughs and colds -certain medicines to help you breathe better -cyclosporine -digoxin -epinephrine -grapefruit or grapefruit juice -methenamine -other medicines for fungal infection -sodium bicarbonate -some antibiotics like clarithromycin, erythromycin, and telithromycin -some medicines for an irregular heartbeat or other heart problems -some medicines for cancer, like lapatinib and tamoxifen -some medicines for HIV This list may not describe all possible interactions. Give your health care provider a list of all the medicines, herbs, non-prescription drugs, or dietary supplements you use. Also tell them if you smoke, drink alcohol, or use illegal drugs. Some items may interact with your medicine. What should I watch for while using this medicine? Visit your doctor or health care professional for regular checks on your progress. You may need periodic blood checks. Alcohol can increase the chance of getting stomach problems and gout attacks. Do not drink alcohol. What side effects may I notice from receiving this medicine? Side effects that you should report to your doctor or health care professional as soon as possible: -allergic reactions like skin rash, itching or hives, swelling of the face, lips, or tongue -fever, chills, or sore throat -muscle tenderness, pain, or weakness -numbness or tingling in hands or feet -unusual bleeding or bruising -unusually weak or tired -vomiting Side effects that usually do not require medical attention (report to your doctor or health care professional if they continue or are  bothersome): -diarrhea -hair loss -loss of appetite -stomach pain or nausea This list may not describe all possible side effects. Call your doctor for medical advice about side effects. You may report side effects to FDA at 1-800-FDA-1088. Where should I keep my medicine? Keep out of the reach of children. Store at room temperature between 15 and 30 degrees C (59 and 86 degrees F). Keep container tightly closed. Protect from light. Throw away any unused medicine after the expiration date. NOTE: This sheet is a summary. It may not cover all possible information. If you have questions about this medicine, talk to your doctor, pharmacist, or health care provider.  2015, [employer] Standard. (2013-04-05 16:48:38)  Acetaminophen; Oxycodone tablets What is this medicine? ACETAMINOPHEN; OXYCODONE (a set a MEE noe fen; ox i KOE done) is a pain reliever. It is used to treat mild to moderate pain. This medicine may be used for other purposes; ask your health care provider or pharmacist if you have questions. COMMON BRAND NAME(S): Endocet, Magnacet, Narvox, Percocet, Perloxx, Primalev, Primlev, Roxicet, Xolox What should I tell my health care provider before I take this medicine? They need to know if you have any of these conditions: -brain tumor -Crohn's disease, inflammatory bowel disease, or ulcerative colitis -drug abuse or addiction -head  injury -heart or circulation problems -if you often drink alcohol -kidney disease or problems going to the bathroom -liver disease -lung disease, asthma, or breathing problems -an unusual or allergic reaction to acetaminophen, oxycodone, other opioid analgesics, other medicines, foods, dyes, or preservatives -pregnant or trying to get pregnant -breast-feeding How should I use this medicine? Take this medicine by mouth with a full glass of water. Follow the directions on the prescription label. Take your medicine at regular intervals. Do not take your  medicine more often than directed. Talk to your pediatrician regarding the use of this medicine in children. Special care may be needed. Patients over 59 years old may have a stronger reaction and need a smaller dose. Overdosage: If you think you have taken too much of this medicine contact a poison control center or emergency room at once. NOTE: This medicine is only for you. Do not share this medicine with others. What if I miss a dose? If you miss a dose, take it as soon as you can. If it is almost time for your next dose, take only that dose. Do not take double or extra doses. What may interact with this medicine? -alcohol -antihistamines -barbiturates like amobarbital, butalbital, butabarbital, methohexital, pentobarbital, phenobarbital, thiopental, and secobarbital -benztropine -drugs for bladder problems like solifenacin, trospium, oxybutynin, tolterodine, hyoscyamine, and methscopolamine -drugs for breathing problems like ipratropium and tiotropium -drugs for certain stomach or intestine problems like propantheline, homatropine methylbromide, glycopyrrolate, atropine, belladonna, and dicyclomine -general anesthetics like etomidate, ketamine, nitrous oxide, propofol, desflurane, enflurane, halothane, isoflurane, and sevoflurane -medicines for depression, anxiety, or psychotic disturbances -medicines for sleep -muscle relaxants -naltrexone -narcotic medicines (opiates) for pain -phenothiazines like perphenazine, thioridazine, chlorpromazine, mesoridazine, fluphenazine, prochlorperazine, promazine, and trifluoperazine -scopolamine -tramadol -trihexyphenidyl This list may not describe all possible interactions. Give your health care provider a list of all the medicines, herbs, non-prescription drugs, or dietary supplements you use. Also tell them if you smoke, drink alcohol, or use illegal drugs. Some items may interact with your medicine. What should I watch for while using this  medicine? Tell your doctor or health care professional if your pain does not go away, if it gets worse, or if you have new or a different type of pain. You may develop tolerance to the medicine. Tolerance means that you will need a higher dose of the medication for pain relief. Tolerance is normal and is expected if you take this medicine for a long time. Do not suddenly stop taking your medicine because you may develop a severe reaction. Your body becomes used to the medicine. This does NOT mean you are addicted. Addiction is a behavior related to getting and using a drug for a non-medical reason. If you have pain, you have a medical reason to take pain medicine. Your doctor will tell you how much medicine to take. If your doctor wants you to stop the medicine, the dose will be slowly lowered over time to avoid any side effects. You may get drowsy or dizzy. Do not drive, use machinery, or do anything that needs mental alertness until you know how this medicine affects you. Do not stand or sit up quickly, especially if you are an older patient. This reduces the risk of dizzy or fainting spells. Alcohol may interfere with the effect of this medicine. Avoid alcoholic drinks. There are different types of narcotic medicines (opiates) for pain. If you take more than one type at the same time, you may have more side effects. Give your health care  provider a list of all medicines you use. Your doctor will tell you how much medicine to take. Do not take more medicine than directed. Call emergency for help if you have problems breathing. The medicine will cause constipation. Try to have a bowel movement at least every 2 to 3 days. If you do not have a bowel movement for 3 days, call your doctor or health care professional. Do not take Tylenol (acetaminophen) or medicines that have acetaminophen with this medicine. Too much acetaminophen can be very dangerous. Many nonprescription medicines contain acetaminophen. Always  read the labels carefully to avoid taking more acetaminophen. What side effects may I notice from receiving this medicine? Side effects that you should report to your doctor or health care professional as soon as possible: -allergic reactions like skin rash, itching or hives, swelling of the face, lips, or tongue -breathing difficulties, wheezing -confusion -light headedness or fainting spells -severe stomach pain -unusually weak or tired -yellowing of the skin or the whites of the eyes Side effects that usually do not require medical attention (report to your doctor or health care professional if they continue or are bothersome): -dizziness -drowsiness -nausea -vomiting This list may not describe all possible side effects. Call your doctor for medical advice about side effects. You may report side effects to FDA at 1-800-FDA-1088. Where should I keep my medicine? Keep out of the reach of children. This medicine can be abused. Keep your medicine in a safe place to protect it from theft. Do not share this medicine with anyone. Selling or giving away this medicine is dangerous and against the law. Store at room temperature between 20 and 25 degrees C (68 and 77 degrees F). Keep container tightly closed. Protect from light. This medicine may cause accidental overdose and death if it is taken by other adults, children, or pets. Flush any unused medicine down the toilet to reduce the chance of harm. Do not use the medicine after the expiration date. NOTE: This sheet is a summary. It may not cover all possible information. If you have questions about this medicine, talk to your doctor, pharmacist, or health care provider.  2015, [employer]. (2013-05-31 13:17:35)

## 2014-10-07 ENCOUNTER — Emergency Department (HOSPITAL_COMMUNITY)
Admission: EM | Admit: 2014-10-07 | Discharge: 2014-10-07 | Discharge status: Against Medical Advice | Payer: Medicaid Other | Attending: Emergency Medicine | Admitting: Emergency Medicine

## 2014-10-07 ENCOUNTER — Encounter (HOSPITAL_COMMUNITY): Payer: Self-pay | Admitting: Emergency Medicine

## 2014-10-07 DIAGNOSIS — K922 Gastrointestinal hemorrhage, unspecified: Secondary | ICD-10-CM | POA: Insufficient documentation

## 2014-10-07 DIAGNOSIS — I10 Essential (primary) hypertension: Secondary | ICD-10-CM | POA: Insufficient documentation

## 2014-10-07 DIAGNOSIS — I252 Old myocardial infarction: Secondary | ICD-10-CM | POA: Insufficient documentation

## 2014-10-07 DIAGNOSIS — E119 Type 2 diabetes mellitus without complications: Secondary | ICD-10-CM | POA: Insufficient documentation

## 2014-10-07 DIAGNOSIS — G8929 Other chronic pain: Secondary | ICD-10-CM | POA: Diagnosis not present

## 2014-10-07 NOTE — ED Notes (Signed)
Patient states that he has a doctor appointment tomorrow and that he wanted hemoccult card to find out if he was passing blood. Informed patient that he would have to wait and see the MD. Patient states that he was going to leave and see his md tomorrow instead.

## 2014-10-07 NOTE — ED Notes (Signed)
Patient reports small amount of bright red blood tonight when he had a bowel movement. States started last Friday.

## 2014-10-07 NOTE — ED Notes (Signed)
Pt came out of room and asked NT which way was out.  Per NT, pt stated he had an appointment with his regular doctor and was going to leave the e.d.

## 2014-10-24 ENCOUNTER — Emergency Department (HOSPITAL_COMMUNITY)
Admission: EM | Admit: 2014-10-24 | Discharge: 2014-10-24 | Disposition: A | Discharge status: Home / Self Care | Payer: Medicaid Other | Attending: Emergency Medicine | Admitting: Emergency Medicine

## 2014-10-24 ENCOUNTER — Encounter (HOSPITAL_COMMUNITY): Payer: Self-pay | Admitting: Cardiology

## 2014-10-24 DIAGNOSIS — Z794 Long term (current) use of insulin: Secondary | ICD-10-CM | POA: Insufficient documentation

## 2014-10-24 DIAGNOSIS — Z8709 Personal history of other diseases of the respiratory system: Secondary | ICD-10-CM | POA: Insufficient documentation

## 2014-10-24 DIAGNOSIS — R109 Unspecified abdominal pain: Secondary | ICD-10-CM

## 2014-10-24 DIAGNOSIS — R1012 Left upper quadrant pain: Secondary | ICD-10-CM | POA: Diagnosis not present

## 2014-10-24 DIAGNOSIS — Z87891 Personal history of nicotine dependence: Secondary | ICD-10-CM | POA: Insufficient documentation

## 2014-10-24 DIAGNOSIS — Z9861 Coronary angioplasty status: Secondary | ICD-10-CM | POA: Insufficient documentation

## 2014-10-24 DIAGNOSIS — I10 Essential (primary) hypertension: Secondary | ICD-10-CM | POA: Diagnosis not present

## 2014-10-24 DIAGNOSIS — E119 Type 2 diabetes mellitus without complications: Secondary | ICD-10-CM | POA: Insufficient documentation

## 2014-10-24 DIAGNOSIS — I252 Old myocardial infarction: Secondary | ICD-10-CM | POA: Insufficient documentation

## 2014-10-24 DIAGNOSIS — Z79899 Other long term (current) drug therapy: Secondary | ICD-10-CM | POA: Insufficient documentation

## 2014-10-24 DIAGNOSIS — F209 Schizophrenia, unspecified: Secondary | ICD-10-CM | POA: Diagnosis not present

## 2014-10-24 DIAGNOSIS — F319 Bipolar disorder, unspecified: Secondary | ICD-10-CM | POA: Insufficient documentation

## 2014-10-24 DIAGNOSIS — M109 Gout, unspecified: Secondary | ICD-10-CM | POA: Insufficient documentation

## 2014-10-24 DIAGNOSIS — Z8673 Personal history of transient ischemic attack (TIA), and cerebral infarction without residual deficits: Secondary | ICD-10-CM | POA: Insufficient documentation

## 2014-10-24 DIAGNOSIS — G8929 Other chronic pain: Secondary | ICD-10-CM | POA: Diagnosis not present

## 2014-10-24 LAB — CBC WITH DIFFERENTIAL/PLATELET
Basophils Absolute: 0 10*3/uL (ref 0.0–0.1)
Basophils Relative: 0 % (ref 0–1)
EOS ABS: 0.2 10*3/uL (ref 0.0–0.7)
Eosinophils Relative: 2 % (ref 0–5)
HCT: 35.6 % — ABNORMAL LOW (ref 39.0–52.0)
Hemoglobin: 11.8 g/dL — ABNORMAL LOW (ref 13.0–17.0)
Lymphocytes Relative: 45 % (ref 12–46)
Lymphs Abs: 3.6 10*3/uL (ref 0.7–4.0)
MCH: 28.2 pg (ref 26.0–34.0)
MCHC: 33.1 g/dL (ref 30.0–36.0)
MCV: 85 fL (ref 78.0–100.0)
Monocytes Absolute: 0.5 10*3/uL (ref 0.1–1.0)
Monocytes Relative: 7 % (ref 3–12)
Neutro Abs: 3.6 10*3/uL (ref 1.7–7.7)
Neutrophils Relative %: 46 % (ref 43–77)
Platelets: 273 10*3/uL (ref 150–400)
RBC: 4.19 MIL/uL — ABNORMAL LOW (ref 4.22–5.81)
RDW: 14.6 % (ref 11.5–15.5)
WBC: 7.9 10*3/uL (ref 4.0–10.5)

## 2014-10-24 LAB — COMPREHENSIVE METABOLIC PANEL
ALBUMIN: 3.8 g/dL (ref 3.5–5.2)
ALK PHOS: 73 U/L (ref 39–117)
ALT: 20 U/L (ref 0–53)
ANION GAP: 6 (ref 5–15)
AST: 23 U/L (ref 0–37)
BUN: 18 mg/dL (ref 6–23)
CALCIUM: 9.3 mg/dL (ref 8.4–10.5)
CO2: 27 mmol/L (ref 19–32)
CREATININE: 1.05 mg/dL (ref 0.50–1.35)
Chloride: 103 mEq/L (ref 96–112)
GFR calc non Af Amer: 76 mL/min — ABNORMAL LOW (ref >90)
GFR, EST AFRICAN AMERICAN: 88 mL/min — AB (ref >90)
GLUCOSE: 265 mg/dL — AB (ref 70–99)
Potassium: 4.2 mmol/L (ref 3.5–5.1)
Sodium: 136 mmol/L (ref 135–145)
Total Bilirubin: 0.7 mg/dL (ref 0.3–1.2)
Total Protein: 7.6 g/dL (ref 6.0–8.3)

## 2014-10-24 LAB — LIPASE, BLOOD: LIPASE: 31 U/L (ref 11–59)

## 2014-10-24 MED ORDER — FAMOTIDINE 20 MG PO TABS
20.0000 mg | ORAL_TABLET | Freq: Once | ORAL | Status: AC
  Administered 2014-10-24: 20 mg via ORAL
  Filled 2014-10-24: qty 1

## 2014-10-24 MED ORDER — GI COCKTAIL ~~LOC~~
30.0000 mL | Freq: Once | ORAL | Status: AC
  Administered 2014-10-24: 30 mL via ORAL
  Filled 2014-10-24: qty 30

## 2014-10-24 MED ORDER — FAMOTIDINE 20 MG PO TABS: 20.0000 mg | ORAL_TABLET | Freq: Two times a day (BID) | ORAL | Status: DC

## 2014-10-24 NOTE — Discharge Instructions (Signed)
Take the prescribed medication as directed. °Follow-up with your primary care physician. °Return to the ED for new or worsening symptoms. ° °

## 2014-10-24 NOTE — ED Notes (Signed)
Pt to department via EMS- pt reports that he was in jail this weekend. Pt reports that he has had abd pain since then. Pt reports LUQ pain. [id number] Hr-[id number]

## 2014-10-24 NOTE — ED Provider Notes (Signed)
CSN: [id number]     Arrival date & time 10/24/14  1732 History   First MD Initiated Contact with Patient 10/24/14 1934     Chief Complaint  Patient presents with  . Abdominal Pain     (Consider location/radiation/quality/duration/timing/severity/associated sxs/prior Treatment) Patient is a 59 y.o. male presenting with abdominal pain. The history is provided by the patient and medical records.  Abdominal Pain   This is a 59 year old male with past medical history significant for hypertension, diabetes, gout, bipolar disorder, schizophrenia, presenting to the ED for abdominal pain. Patient states he was incarcerated over the weekend, and has been having left upper quadrant since this time, occasionally accompanied by burning senstaion. He states it feels similar to her prior ulcer that he has had past. He denies any melena or hematochezia.  Patient is not currently on any type of antacids.  He denies intake of spicy foods or other foods that may irritate his ulcer.  No chest pain or SOB.  No nausea, vomiting, or diarrhea.  No fever, chills, cough, or other URI type symptoms.  No prior abdominal surgeries.  VS stable on arrival.  Past Medical History  Diagnosis Date  . Diabetes mellitus   . Hypertension   . Gout   . MI (myocardial infarction)   . Chronic pain     right elbow  . Major depression, chronic   . Bipolar disorder   . Schizophrenia   . Subarachnoid hemorrhage september 2015  . Acute respiratory failure September 2015  . 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 Bacteremia   . Chronic pain   . Stroke    Past Surgical History  Procedure Laterality Date  . Coronary angioplasty with stent placement      2008  . Knee surgery      both  . Tee without cardioversion N/A 07/26/2014    Procedure: TRANSESOPHAGEAL ECHOCARDIOGRAM (TEE);  Surgeon: Lars Masson, MD;  Location: [facility] ENDOSCOPY;  Service: Cardiovascular;  Laterality: N/A;    Family History  Problem Relation Age of Onset  . Diabetic kidney disease Mother   . Hypertension Mother   . Gout Mother   . Diabetic kidney disease Father   . Heart attack Brother 37   History  Substance Use Topics  . Smoking status: Former Smoker -- .1 years    Types: Cigarettes    Quit date: 01/07/2013  . Smokeless tobacco: Never Used  . Alcohol Use: No     Comment: denies    Review of Systems  Gastrointestinal: Positive for abdominal pain.  All other systems reviewed and are negative.     Allergies  Fish allergy; Oatmeal; Tomato; and Lactose intolerance (gi)  Home Medications   Prior to Admission medications   Medication Sig Start Date End Date Taking? Authorizing Provider  amLODipine (NORVASC) 10 MG tablet Take 1 tablet (10 mg total) by mouth daily. 08/01/14   Ripudeep Jenna Luo, MD  colchicine 0.6 MG tablet Take 1 tablet (0.6 mg total) by mouth 2 (two) times daily. 09/17/14   Dione Booze, MD  docusate sodium 100 MG CAPS Take 100 mg by mouth 2 (two) times daily. 08/01/14   Ripudeep Jenna Luo, MD  furosemide (LASIX) 40 MG tablet Take 1 tablet (40 mg total) by mouth 2 (two) times daily. 08/01/14   Ripudeep Jenna Luo, MD  hydrALAZINE (APRESOLINE) 50 MG tablet Take 1 tablet (50 mg total) by mouth 3 (three) times daily. 08/01/14  Ripudeep Jenna Luo, MD  HYDROcodone-acetaminophen (NORCO/VICODIN) 5-325 MG per tablet Take 1 tablet by mouth every 4 (four) hours as needed. 08/24/14   Kathie Dike, PA-C  insulin aspart (NOVOLOG) 100 UNIT/ML injection Inject 0-9 Units into the skin 3 (three) times daily with meals. Sliding scale CBG 70 - 120: 0 units CBG 121 - 150: 1 unit,  CBG 151 - 200: 2 units,  CBG 201 - 250: 3 units,  CBG 251 - 300: 5 units,  CBG 301 - 350: 7 units,  CBG 351 - 400: 9 units   CBG > 400: 9 units and notify your MD 08/01/14   Ripudeep Jenna Luo, MD  insulin NPH-regular Human (NOVOLIN 70/30) (70-30) 100 UNIT/ML injection Inject 43 Units into the skin 2 (two) times daily with a  meal. 08/01/14   Ripudeep K Rai, MD  metoprolol tartrate (LOPRESSOR) 25 MG tablet Take 1 tablet (25 mg total) by mouth daily. 08/01/14   Ripudeep Jenna Luo, MD  Multiple Vitamin (MULTIVITAMIN WITH MINERALS) TABS tablet Take 1 tablet by mouth daily. 08/01/14   Ripudeep Jenna Luo, MD  oxyCODONE-acetaminophen (PERCOCET) 5-325 MG per tablet Take 1 tablet by mouth every 4 (four) hours as needed for moderate pain. 09/17/14   Dione Booze, MD  pantoprazole (PROTONIX) 40 MG tablet Take 1 tablet (40 mg total) by mouth at bedtime. 08/01/14   Ripudeep Jenna Luo, MD  polyethylene glycol (MIRALAX / GLYCOLAX) packet Take 17 g by mouth daily as needed for moderate constipation. 08/01/14   Ripudeep Jenna Luo, MD  QUEtiapine (SEROQUEL) 25 MG tablet Take 1 tablet (25 mg total) by mouth every morning. 08/23/14   Gerhard Munch, MD  QUEtiapine (SEROQUEL) 50 MG tablet Take 1 tablet (50 mg total) by mouth at bedtime. 08/23/14   Gerhard Munch, MD   BP 121/70 mmHg  Pulse 96  Temp(Src) 97.4 F (36.3 C) (Oral)  Resp 18  SpO2 100%   Physical Exam  Constitutional: He is oriented to person, place, and time. He appears well-developed and well-nourished. No distress.  HENT:  Head: Normocephalic and atraumatic.  Mouth/Throat: Oropharynx is clear and moist.  Eyes: Conjunctivae and EOM are normal. Pupils are equal, round, and reactive to light.  Neck: Normal range of motion. Neck supple.  Cardiovascular: Normal rate, regular rhythm and normal heart sounds.   Pulmonary/Chest: Effort normal and breath sounds normal. No respiratory distress. He has no wheezes.  Abdominal: Soft. Bowel sounds are normal. There is no tenderness. There is no guarding and no CVA tenderness.  Abdomen soft, nondistended, no focal tenderness or peritoneal signs  Musculoskeletal: Normal range of motion. He exhibits no edema.  Neurological: He is alert and oriented to person, place, and time.  Skin: Skin is warm and dry. He is not diaphoretic.  Psychiatric: He has  a normal mood and affect.  Nursing note and vitals reviewed.   ED Course  Procedures (including critical care time) Labs Review Labs Reviewed  CBC WITH DIFFERENTIAL - Abnormal; Notable for the following:    RBC 4.19 (*)    Hemoglobin 11.8 (*)    HCT 35.6 (*)    All other components within normal limits  COMPREHENSIVE METABOLIC PANEL - Abnormal; Notable for the following:    Glucose, Bld 265 (*)    GFR calc non Af Amer 76 (*)    GFR calc Af Amer 88 (*)    All other components within normal limits  LIPASE, BLOOD  URINALYSIS, ROUTINE W REFLEX MICROSCOPIC  Imaging Review No results found.   EKG Interpretation None      MDM   Final diagnoses:  Abdominal pain, unspecified abdominal location   59 year old male with abdominal pain beginning this weekend. Localized left upper quadrant with somewhat of a burning sensation, states it feels similar to prior ulcers. No reported melena or hematochezia.  On exam, patient afebrile and nontoxic. His abdominal exam is overall benign. Lab work is reassuring.  He was given GI cocktail and dose of Pepcid with improvement of his symptoms. Patient states he is feeling better and would like to be discharged home as his ride is here to pick him up. Recommended starting daily pepcid.  Encouraged FU with PCP.  Discussed plan with patient, he/she acknowledged understanding and agreed with plan of care.  Return precautions given for new or worsening symptoms.  Garlon Hatchet, PA-C 10/24/14 2300  Joya Gaskins, MD 10/25/14 [phone]

## 2014-10-24 NOTE — ED Notes (Signed)
Pt informed RN that his ride was here and would like for the MD to discharge them. Misty Stanley PA made aware.

## 2014-12-03 ENCOUNTER — Emergency Department (HOSPITAL_COMMUNITY)
Admission: EM | Admit: 2014-12-03 | Discharge: 2014-12-07 | Disposition: A | Discharge status: Home / Self Care | Payer: Medicaid Other | Attending: Emergency Medicine | Admitting: Emergency Medicine

## 2014-12-03 ENCOUNTER — Encounter (HOSPITAL_COMMUNITY): Payer: Self-pay | Admitting: Emergency Medicine

## 2014-12-03 ENCOUNTER — Emergency Department (HOSPITAL_COMMUNITY): Payer: Medicaid Other

## 2014-12-03 DIAGNOSIS — M109 Gout, unspecified: Secondary | ICD-10-CM | POA: Diagnosis not present

## 2014-12-03 DIAGNOSIS — Y998 Other external cause status: Secondary | ICD-10-CM | POA: Insufficient documentation

## 2014-12-03 DIAGNOSIS — Z8673 Personal history of transient ischemic attack (TIA), and cerebral infarction without residual deficits: Secondary | ICD-10-CM | POA: Insufficient documentation

## 2014-12-03 DIAGNOSIS — Z8709 Personal history of other diseases of the respiratory system: Secondary | ICD-10-CM | POA: Diagnosis not present

## 2014-12-03 DIAGNOSIS — Z87891 Personal history of nicotine dependence: Secondary | ICD-10-CM | POA: Insufficient documentation

## 2014-12-03 DIAGNOSIS — E1165 Type 2 diabetes mellitus with hyperglycemia: Secondary | ICD-10-CM | POA: Diagnosis not present

## 2014-12-03 DIAGNOSIS — R440 Auditory hallucinations: Secondary | ICD-10-CM | POA: Diagnosis present

## 2014-12-03 DIAGNOSIS — W1839XA Other fall on same level, initial encounter: Secondary | ICD-10-CM | POA: Insufficient documentation

## 2014-12-03 DIAGNOSIS — G8929 Other chronic pain: Secondary | ICD-10-CM | POA: Insufficient documentation

## 2014-12-03 DIAGNOSIS — Y9289 Other specified places as the place of occurrence of the external cause: Secondary | ICD-10-CM | POA: Diagnosis not present

## 2014-12-03 DIAGNOSIS — Z9861 Coronary angioplasty status: Secondary | ICD-10-CM | POA: Insufficient documentation

## 2014-12-03 DIAGNOSIS — Z794 Long term (current) use of insulin: Secondary | ICD-10-CM | POA: Diagnosis not present

## 2014-12-03 DIAGNOSIS — F1410 Cocaine abuse, uncomplicated: Secondary | ICD-10-CM | POA: Diagnosis not present

## 2014-12-03 DIAGNOSIS — Z79899 Other long term (current) drug therapy: Secondary | ICD-10-CM | POA: Diagnosis not present

## 2014-12-03 DIAGNOSIS — S3992XA Unspecified injury of lower back, initial encounter: Secondary | ICD-10-CM | POA: Diagnosis not present

## 2014-12-03 DIAGNOSIS — S8991XA Unspecified injury of right lower leg, initial encounter: Secondary | ICD-10-CM | POA: Insufficient documentation

## 2014-12-03 DIAGNOSIS — F333 Major depressive disorder, recurrent, severe with psychotic symptoms: Secondary | ICD-10-CM | POA: Diagnosis not present

## 2014-12-03 DIAGNOSIS — R739 Hyperglycemia, unspecified: Secondary | ICD-10-CM

## 2014-12-03 DIAGNOSIS — R45851 Suicidal ideations: Secondary | ICD-10-CM | POA: Diagnosis present

## 2014-12-03 DIAGNOSIS — I252 Old myocardial infarction: Secondary | ICD-10-CM | POA: Insufficient documentation

## 2014-12-03 DIAGNOSIS — Y9389 Activity, other specified: Secondary | ICD-10-CM | POA: Insufficient documentation

## 2014-12-03 DIAGNOSIS — F209 Schizophrenia, unspecified: Secondary | ICD-10-CM | POA: Diagnosis present

## 2014-12-03 DIAGNOSIS — I10 Essential (primary) hypertension: Secondary | ICD-10-CM | POA: Insufficient documentation

## 2014-12-03 LAB — CBC
HEMATOCRIT: 36.9 % — AB (ref 39.0–52.0)
Hemoglobin: 12.2 g/dL — ABNORMAL LOW (ref 13.0–17.0)
MCH: 29.1 pg (ref 26.0–34.0)
MCHC: 33.1 g/dL (ref 30.0–36.0)
MCV: 88.1 fL (ref 78.0–100.0)
Platelets: 267 10*3/uL (ref 150–400)
RBC: 4.19 MIL/uL — AB (ref 4.22–5.81)
RDW: 14.3 % (ref 11.5–15.5)
WBC: 7.7 10*3/uL (ref 4.0–10.5)

## 2014-12-03 LAB — COMPREHENSIVE METABOLIC PANEL
ALT: 14 U/L (ref 0–53)
ANION GAP: 10 (ref 5–15)
AST: 19 U/L (ref 0–37)
Albumin: 3.9 g/dL (ref 3.5–5.2)
Alkaline Phosphatase: 79 U/L (ref 39–117)
BILIRUBIN TOTAL: 0.6 mg/dL (ref 0.3–1.2)
BUN: 12 mg/dL (ref 6–23)
CALCIUM: 8.5 mg/dL (ref 8.4–10.5)
CO2: 25 mmol/L (ref 19–32)
Chloride: 96 mmol/L (ref 96–112)
Creatinine, Ser: 1.16 mg/dL (ref 0.50–1.35)
GFR calc non Af Amer: 67 mL/min — ABNORMAL LOW (ref >90)
GFR, EST AFRICAN AMERICAN: 78 mL/min — AB (ref >90)
GLUCOSE: 613 mg/dL — AB (ref 70–99)
POTASSIUM: 4.1 mmol/L (ref 3.5–5.1)
Sodium: 131 mmol/L — ABNORMAL LOW (ref 135–145)
Total Protein: 7.5 g/dL (ref 6.0–8.3)

## 2014-12-03 LAB — RAPID URINE DRUG SCREEN, HOSP PERFORMED
Amphetamines: NOT DETECTED
Barbiturates: NOT DETECTED
Benzodiazepines: NOT DETECTED
Cocaine: POSITIVE — AB
OPIATES: NOT DETECTED
Tetrahydrocannabinol: NOT DETECTED

## 2014-12-03 LAB — CBG MONITORING, ED
GLUCOSE-CAPILLARY: 292 mg/dL — AB (ref 70–99)
Glucose-Capillary: 461 mg/dL — ABNORMAL HIGH (ref 70–99)

## 2014-12-03 LAB — ETHANOL

## 2014-12-03 LAB — SALICYLATE LEVEL

## 2014-12-03 LAB — ACETAMINOPHEN LEVEL

## 2014-12-03 MED ORDER — SODIUM CHLORIDE 0.9 % IV BOLUS (SEPSIS)
1000.0000 mL | Freq: Once | INTRAVENOUS | Status: AC
  Administered 2014-12-03: 1000 mL via INTRAVENOUS

## 2014-12-03 MED ORDER — QUETIAPINE FUMARATE 50 MG PO TABS
50.0000 mg | ORAL_TABLET | Freq: Every day | ORAL | Status: DC
  Administered 2014-12-03 – 2014-12-06 (×4): 50 mg via ORAL
  Filled 2014-12-03 (×4): qty 1

## 2014-12-03 MED ORDER — LORAZEPAM 1 MG PO TABS
0.0000 mg | ORAL_TABLET | Freq: Four times a day (QID) | ORAL | Status: AC
  Administered 2014-12-03: 1 mg via ORAL
  Administered 2014-12-04: 2 mg via ORAL
  Filled 2014-12-03: qty 1
  Filled 2014-12-03: qty 2
  Filled 2014-12-03 (×2): qty 1

## 2014-12-03 MED ORDER — INSULIN ASPART 100 UNIT/ML ~~LOC~~ SOLN
5.0000 [IU] | Freq: Once | SUBCUTANEOUS | Status: AC
  Administered 2014-12-03: 5 [IU] via SUBCUTANEOUS
  Filled 2014-12-03: qty 1

## 2014-12-03 MED ORDER — FAMOTIDINE 20 MG PO TABS
20.0000 mg | ORAL_TABLET | Freq: Two times a day (BID) | ORAL | Status: DC
  Administered 2014-12-03 – 2014-12-07 (×8): 20 mg via ORAL
  Filled 2014-12-03 (×8): qty 1

## 2014-12-03 MED ORDER — QUETIAPINE FUMARATE 25 MG PO TABS
25.0000 mg | ORAL_TABLET | Freq: Every morning | ORAL | Status: DC
  Administered 2014-12-04 – 2014-12-06 (×3): 25 mg via ORAL
  Filled 2014-12-03 (×3): qty 1

## 2014-12-03 MED ORDER — ALUM & MAG HYDROXIDE-SIMETH 200-200-20 MG/5ML PO SUSP: 30.0000 mL | ORAL | Status: DC | PRN

## 2014-12-03 MED ORDER — AMLODIPINE BESYLATE 10 MG PO TABS
10.0000 mg | ORAL_TABLET | Freq: Every day | ORAL | Status: DC
  Administered 2014-12-03 – 2014-12-07 (×5): 10 mg via ORAL
  Filled 2014-12-03 (×5): qty 1

## 2014-12-03 MED ORDER — INSULIN ASPART PROT & ASPART (70-30 MIX) 100 UNIT/ML ~~LOC~~ SUSP
40.0000 [IU] | Freq: Two times a day (BID) | SUBCUTANEOUS | Status: DC
  Administered 2014-12-04 – 2014-12-07 (×7): 40 [IU] via SUBCUTANEOUS
  Filled 2014-12-03 (×2): qty 10

## 2014-12-03 MED ORDER — LORAZEPAM 1 MG PO TABS
0.0000 mg | ORAL_TABLET | Freq: Two times a day (BID) | ORAL | Status: DC
  Administered 2014-12-06: 1 mg via ORAL
  Filled 2014-12-03: qty 1

## 2014-12-03 MED ORDER — METOPROLOL TARTRATE 25 MG PO TABS
25.0000 mg | ORAL_TABLET | Freq: Every day | ORAL | Status: DC
  Administered 2014-12-03 – 2014-12-07 (×5): 25 mg via ORAL
  Filled 2014-12-03 (×5): qty 1

## 2014-12-03 MED ORDER — ONDANSETRON HCL 4 MG PO TABS: 4.0000 mg | ORAL_TABLET | Freq: Three times a day (TID) | ORAL | Status: DC | PRN

## 2014-12-03 MED ORDER — COLCHICINE 0.6 MG PO TABS
0.6000 mg | ORAL_TABLET | Freq: Two times a day (BID) | ORAL | Status: DC
  Administered 2014-12-03 – 2014-12-07 (×8): 0.6 mg via ORAL
  Filled 2014-12-03 (×9): qty 1

## 2014-12-03 MED ORDER — IBUPROFEN 200 MG PO TABS
600.0000 mg | ORAL_TABLET | Freq: Three times a day (TID) | ORAL | Status: DC | PRN
  Administered 2014-12-05 – 2014-12-07 (×3): 600 mg via ORAL
  Filled 2014-12-03 (×3): qty 3

## 2014-12-03 MED ORDER — NICOTINE 21 MG/24HR TD PT24
21.0000 mg | MEDICATED_PATCH | Freq: Every day | TRANSDERMAL | Status: DC
  Administered 2014-12-04: 21 mg via TRANSDERMAL
  Filled 2014-12-03 (×3): qty 1

## 2014-12-03 MED ORDER — ADULT MULTIVITAMIN W/MINERALS CH
1.0000 | ORAL_TABLET | Freq: Every day | ORAL | Status: DC
  Administered 2014-12-03 – 2014-12-07 (×5): 1 via ORAL
  Filled 2014-12-03 (×5): qty 1

## 2014-12-03 MED ORDER — HYDRALAZINE HCL 50 MG PO TABS
50.0000 mg | ORAL_TABLET | Freq: Three times a day (TID) | ORAL | Status: DC
  Administered 2014-12-03 – 2014-12-07 (×11): 50 mg via ORAL
  Filled 2014-12-03 (×13): qty 1

## 2014-12-03 MED ORDER — INSULIN ASPART 100 UNIT/ML ~~LOC~~ SOLN
0.0000 [IU] | Freq: Three times a day (TID) | SUBCUTANEOUS | Status: DC
  Administered 2014-12-04: 1 [IU] via SUBCUTANEOUS
  Administered 2014-12-04: 5 [IU] via SUBCUTANEOUS
  Administered 2014-12-05: 3 [IU] via SUBCUTANEOUS
  Administered 2014-12-05: 1 [IU] via SUBCUTANEOUS
  Administered 2014-12-05 – 2014-12-06 (×2): 2 [IU] via SUBCUTANEOUS
  Administered 2014-12-06: 3 [IU] via SUBCUTANEOUS
  Administered 2014-12-06: 9 [IU] via SUBCUTANEOUS
  Administered 2014-12-07: 3 [IU] via SUBCUTANEOUS
  Filled 2014-12-03 (×7): qty 1

## 2014-12-03 MED ORDER — POLYETHYLENE GLYCOL 3350 17 G PO PACK
17.0000 g | PACK | Freq: Every day | ORAL | Status: DC | PRN
  Filled 2014-12-03: qty 1

## 2014-12-03 MED ORDER — DOCUSATE SODIUM 100 MG PO CAPS
100.0000 mg | ORAL_CAPSULE | Freq: Two times a day (BID) | ORAL | Status: DC
  Administered 2014-12-03 – 2014-12-07 (×7): 100 mg via ORAL
  Filled 2014-12-03 (×7): qty 1

## 2014-12-03 MED ORDER — FUROSEMIDE 40 MG PO TABS
40.0000 mg | ORAL_TABLET | Freq: Two times a day (BID) | ORAL | Status: DC
  Administered 2014-12-04 – 2014-12-07 (×6): 40 mg via ORAL
  Filled 2014-12-03 (×6): qty 1

## 2014-12-03 MED ORDER — INSULIN ASPART 100 UNIT/ML ~~LOC~~ SOLN
10.0000 [IU] | Freq: Once | SUBCUTANEOUS | Status: AC
  Administered 2014-12-03: 10 [IU] via SUBCUTANEOUS
  Filled 2014-12-03: qty 1

## 2014-12-03 MED ORDER — ACETAMINOPHEN 325 MG PO TABS
650.0000 mg | ORAL_TABLET | ORAL | Status: DC | PRN
  Administered 2014-12-04 – 2014-12-05 (×2): 650 mg via ORAL
  Filled 2014-12-03 (×2): qty 2

## 2014-12-03 NOTE — BH Assessment (Addendum)
2038:  Consult with Dr. Hyacinth MeekerMiller about the Patient.  Dr. Hyacinth MeekerMiller reports the Patient has been off his regular medication for 3 days and is currently experiencing high blood sugar.  He reports the Patient is also experiencing auditory hallucinations.  The Doctor reports that Patient recently used cocaine.  2040:  Unable to assess the Patient at this time [facility]  Attending Nurse reports no current rooms are available and is unsure when a room will be available.    0235:  Attempted to assess Patient after he was placed in a room.  Patient was non-responsive to questions and continued to sleep.     0500:  Attempted to assess the Patient, Patient would not wake up or response to questions.

## 2014-12-03 NOTE — ED Notes (Signed)
Pt reports auditory and visual hallucinations with thoughts of hurting others. Pt states if the voices continue he will hurt himself. Pt states he sees shadows and feels like people are following him. Pt also reports bilateral knee pain, lower back pain and a HA for the past 2 days. Last injury was a fall 2 weeks ago. Pt reports etoh and drug use in the past month to self medication. Last drink etoh 3 weeks ago. Last cocaine use 2 weeks ago.
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
## 2014-12-04 LAB — CBG MONITORING, ED
GLUCOSE-CAPILLARY: 195 mg/dL — AB (ref 70–99)
GLUCOSE-CAPILLARY: 288 mg/dL — AB (ref 70–99)
GLUCOSE-CAPILLARY: 97 mg/dL (ref 70–99)
Glucose-Capillary: 195 mg/dL — ABNORMAL HIGH (ref 70–99)
Glucose-Capillary: 138 mg/dL — ABNORMAL HIGH (ref 70–99)

## 2014-12-04 NOTE — ED Notes (Signed)
Pt states he is unable to walk without assistance due to his knee pain.

## 2014-12-04 NOTE — ED Provider Notes (Signed)
The magistrate rejected the IVC papers filled out by Dr. Hyacinth MeekerMiller. I went in to see the patient, he is sleeping. He mainly wants to talk about his knees hurting. I reviewed Dr. Rondel BatonMiller's notes and the nurses notes and have redone the IVC papers.  Ward GivensIva L Jakeb Lamping, MD 12/04/14 [phone]
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
## 2014-12-04 NOTE — Progress Notes (Signed)
Patient remains difficult to arouse but his blood sugars are under 100.  When the RN and Tech move him, he awakens but not to a point to obtain an accurate assessment.  Evidently, he became drowsy and started sleeping after the Seroquel.    Nanine MeansJamison Nakiesha Rumsey, PMH-NP

## 2014-12-04 NOTE — ED Notes (Signed)
Pt asked to attempt to walk. Pt states he cant move his legs off the bed to even attempt to walk. Encouragement was given without success  

## 2014-12-04 NOTE — ED Notes (Signed)
Pt presented with IVC paperwork

## 2014-12-04 NOTE — ED Notes (Addendum)
Note:  Two bags of belongings which had been wanded and checked by security are placed into locker# 27 at this time.

## 2014-12-04 NOTE — ED Notes (Signed)
Pt asked to attempt to walk. Pt states he cant move his legs off the bed to even attempt to walk. Encouragement was given without success

## 2014-12-04 NOTE — ED Notes (Signed)
Debroah (sister) [phone] and [phone]

## 2014-12-04 NOTE — ED Notes (Signed)
Dr Rubin PayorPickering informed that patient does not have sliding scale for novolog. Per Dr Rubin PayorPickering home sliding scale should be used. Verified with charge nurse patty.

## 2014-12-04 NOTE — ED Notes (Signed)
Patient unable to wake. Psych team at bedside. Wants BS check again. BS 195. Patient aroused, states that he just wants to sleep.

## 2014-12-04 NOTE — ED Notes (Signed)
Verified with pharmacy insulin Novolog 70/30 can be stored at room temp with other medications.

## 2014-12-04 NOTE — ED Notes (Signed)
Pt yelling out to nurses station for his grandmother. Pt redirected to his location, pt stating he is wrapped up in tape and we need to take it all off of him. Pt reassured that there was no tape on him, pt cussing and upset at this time.

## 2014-12-05 DIAGNOSIS — R45850 Homicidal ideations: Secondary | ICD-10-CM

## 2014-12-05 DIAGNOSIS — F209 Schizophrenia, unspecified: Secondary | ICD-10-CM

## 2014-12-05 LAB — CBG MONITORING, ED
GLUCOSE-CAPILLARY: 233 mg/dL — AB (ref 70–99)
GLUCOSE-CAPILLARY: 177 mg/dL — AB (ref 70–99)
Glucose-Capillary: 147 mg/dL — ABNORMAL HIGH (ref 70–99)

## 2014-12-05 NOTE — Progress Notes (Addendum)
  CSW faxed patient referral to [facility], [facility], and [facility]. CSW attempted to fax pt referral multiple times to [facility], [facility], [facility], [facility], [facility] (pt referral did not go through).  Melbourne Abtsatia Poppy Mcafee, LCSWA Disposition staff 12/05/2014 7:44 PM
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
## 2014-12-05 NOTE — ED Notes (Signed)
Pt called out to NS requesting urinal, which was provided to him. Pt used urinal and called out for assistance to empty urinal without difficulty or incident.

## 2014-12-06 DIAGNOSIS — R45851 Suicidal ideations: Secondary | ICD-10-CM

## 2014-12-06 DIAGNOSIS — F333 Major depressive disorder, recurrent, severe with psychotic symptoms: Secondary | ICD-10-CM | POA: Diagnosis present

## 2014-12-06 LAB — CBG MONITORING, ED
Glucose-Capillary: 165 mg/dL — ABNORMAL HIGH (ref 70–99)
Glucose-Capillary: 294 mg/dL — ABNORMAL HIGH (ref 70–99)
Glucose-Capillary: 352 mg/dL — ABNORMAL HIGH (ref 70–99)

## 2014-12-06 MED ORDER — QUETIAPINE FUMARATE 25 MG PO TABS
25.0000 mg | ORAL_TABLET | Freq: Two times a day (BID) | ORAL | Status: DC
  Administered 2014-12-06 – 2014-12-07 (×2): 25 mg via ORAL
  Filled 2014-12-06 (×2): qty 1

## 2014-12-06 NOTE — BH Assessment (Signed)
[facility] Assessment Progress Note  The following facilities have been contacted in an effort to place this pt, with results as noted:  Beds available, information faxed, decision pending[facility]  [facility]  No beds currently available, but accepted faxed [facility] for future [facility]  [facility]ine[facility] ([facility]  Doylene Canninghomas Cherlynn Popiel, MA Triage Specialist 12/06/2014 @ 16:03

## 2014-12-06 NOTE — ED Notes (Signed)
Pt using phone to talk to wife at this time, no aggression noted, pt using walker to get around to restroom

## 2014-12-06 NOTE — Consult Note (Signed)
[facility]   Reason for Consult:  Command hallucinations Referring Physician:  EDP Patient Identification: [name] MRN:  [id number] Principal Diagnosis: Depression, major, recurrent, severe with psychosis Diagnosis:   Patient Active Problem List   Diagnosis Date Noted  . Depression, major, recurrent, severe with psychosis [F33.3] 12/06/2014    Priority: High  . Cocaine abuse [F14.10] 02/19/2013    Priority: High  . Protein-calorie malnutrition, severe [E43] 07/28/2014  . Hyponatremia [E87.1] 07/27/2014  . Bacteremia [R78.81] 07/27/2014  . Acute respiratory failure [J96.00] 07/17/2014  . Subarachnoid hemorrhage [I60.9] 06/21/2014  . Hyperglycemia [R73.9] 01/27/2014  . 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 DM (diabetes mellitus), type 2, uncontrolled [E11.65] 02/13/2013  . Diabetes mellitus [E11.9] 12/08/2011  . Noncompliance [Z91.19] 12/08/2011  . CAD (coronary artery disease) [I25.10] 12/08/2011  . Chest pain [R07.9] 12/08/2011  . Chronic pain [G89.29]   . Gout [M10.9]   . Hypertension [I10]     Total Time spent with patient: 30 minutes  Subjective:   [name] is a 59 y.o. male patient admitted with psychosis.  HPI:  The patient had been living with his wife for the past week.  He was hearing voices that became worse when he started abusing cocaine.  The voices began telling him to kill himself and he came to the ED.  Seroquel started and increased today, awaits for an inpatient bed.  Denies homicidal ideations. HPI Elements:   Location:  generalized . Quality:  acute. Severity:  severe. Timing:  constant. Duration:  few days. Context:  stressors.  Past Medical History:  Past Medical History  Diagnosis Date  . Diabetes mellitus   . Hypertension   . Gout   . MI (myocardial infarction)   . Chronic pain     right elbow  . Major depression, chronic   . Bipolar disorder   .  Schizophrenia   . Subarachnoid hemorrhage september 2015  . Acute respiratory failure September 2015  . 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 Bacteremia   . Chronic pain   . Stroke     Past Surgical History  Procedure Laterality Date  . Coronary angioplasty with stent placement      2008  . Knee surgery      both  . Tee without cardioversion N/A 07/26/2014    Procedure: TRANSESOPHAGEAL ECHOCARDIOGRAM (TEE);  Surgeon: Lars MassonKatarina H Nelson, MD;  [facility];  Service: Cardiovascular;  Laterality: N/A;   Family History:  Family History  Problem Relation Age of Onset  . Diabetic kidney disease Mother   . Hypertension Mother   . Gout Mother   . Diabetic kidney disease Father   . Heart attack Brother 2037   Social History:  History  Alcohol Use No    Comment: denies     History  Drug Use  . Yes  . Special: Cocaine    Comment: history of cocaine use, pt states last use was before stroke in September of this year.    History   Social History  . Marital Status: Divorced    Spouse Name: N/A  . Number of Children: N/A  . Years of Education: N/A   Social History Main Topics  . Smoking status: Former Smoker -- .1 years    Types: Cigarettes    Quit date: 01/07/2013  . Smokeless tobacco: Never Used  . Alcohol Use: No  Comment: denies  . Drug Use: Yes    Special: Cocaine     Comment: history of cocaine use, pt states last use was before stroke in September of this year.  . Sexual Activity: Not on file   Other Topics Concern  . None   Social History Narrative   Additional Social History:                          Allergies:   Allergies  Allergen Reactions  . Fish Allergy     All seafood makes his throat swell  . Oatmeal     itching  . Tomato Anaphylaxis and Shortness Of Breath    Cannot breathe  . Lactose Intolerance (Gi) Diarrhea and Nausea And Vomiting    Vitals: Blood pressure 143/73, pulse  88, temperature 98 F (36.7 C), temperature source Oral, resp. rate 14, SpO2 93 %.  Risk to Self: Suicidal Ideation: No-Not Currently/Within Last 6 Months Suicidal Intent: No-Not Currently/Within Last 6 Months Is patient at risk for suicide?: No Suicidal Plan?: No-Not Currently/Within Last 6 Months Access to Means: No What has been your use of drugs/alcohol within the last 12 months?: cocaine Intentional Self Injurious Behavior: None Risk to Others: Homicidal Ideation: No-Not Currently/Within Last 6 Months Current Homicidal Intent: No-Not Currently/Within Last 6 Months Current Homicidal Plan: No-Not Currently/Within Last 6 Months Access to Homicidal Means: No Assessment of Violence: None Noted Does patient have access to weapons?:  (unable to assess) Criminal Charges Pending?:  (unknown) Prior Inpatient Therapy: Prior Inpatient Therapy:  (unable to assess) Prior Outpatient Therapy:    Current Facility-Administered Medications  Medication Dose Route Frequency Provider Last Rate Last Dose  . acetaminophen (TYLENOL) tablet 650 mg  650 mg Oral Q4H PRN Vida Roller, MD   650 mg at 12/05/14 1610  . alum & mag hydroxide-simeth (MAALOX/MYLANTA) 200-200-20 MG/5ML suspension 30 mL  30 mL Oral PRN Vida Roller, MD      . amLODipine (NORVASC) tablet 10 mg  10 mg Oral Daily Vida Roller, MD   10 mg at 12/06/14 0859  . colchicine tablet 0.6 mg  0.6 mg Oral BID Vida Roller, MD   0.6 mg at 12/06/14 0859  . docusate sodium (COLACE) capsule 100 mg  100 mg Oral BID Vida Roller, MD   100 mg at 12/05/14 2107  . famotidine (PEPCID) tablet 20 mg  20 mg Oral BID Vida Roller, MD   20 mg at 12/06/14 0858  . furosemide (LASIX) tablet 40 mg  40 mg Oral BID Vida Roller, MD   40 mg at 12/05/14 1821  . hydrALAZINE (APRESOLINE) tablet 50 mg  50 mg Oral TID Vida Roller, MD   50 mg at 12/06/14 0858  . ibuprofen (ADVIL,MOTRIN) tablet 600 mg  600 mg Oral Q8H PRN Vida Roller, MD   600 mg at 12/05/14  2114  . insulin aspart (novoLOG) injection 0-9 Units  0-9 Units Subcutaneous TID WC Nathan R. Rubin Payor, MD   2 Units at 12/06/14 [phone]  . insulin aspart protamine- aspart (NOVOLOG MIX 70/30) injection 40 Units  40 Units Subcutaneous BID WC Vida Roller, MD   40 Units at 12/06/14 [phone]  . LORazepam (ATIVAN) tablet 0-4 mg  0-4 mg Oral Q12H Vida Roller, MD   0 mg at 12/05/14 2200  . metoprolol tartrate (LOPRESSOR) tablet 25 mg  25 mg Oral Daily Vida Roller, MD  25 mg at 12/06/14 0859  . multivitamin with minerals tablet 1 tablet  1 tablet Oral Daily Vida Roller, MD   1 tablet at 12/06/14 [phone]  . nicotine (NICODERM CQ - dosed in mg/24 hours) patch 21 mg  21 mg Transdermal Daily Vida Roller, MD   21 mg at 12/04/14 0543  . ondansetron (ZOFRAN) tablet 4 mg  4 mg Oral Q8H PRN Vida Roller, MD      . polyethylene glycol (MIRALAX / GLYCOLAX) packet 17 g  17 g Oral Daily PRN Vida Roller, MD      . QUEtiapine (SEROQUEL) tablet 25 mg  25 mg Oral BID Nanine Means, NP      . QUEtiapine (SEROQUEL) tablet 50 mg  50 mg Oral QHS Vida Roller, MD   50 mg at 12/05/14 2108   Current Outpatient Prescriptions  Medication Sig Dispense Refill  . amLODipine (NORVASC) 10 MG tablet Take 1 tablet (10 mg total) by mouth daily. 30 tablet 4  . colchicine 0.6 MG tablet Take 1 tablet (0.6 mg total) by mouth 2 (two) times daily. 60 tablet 0  . docusate sodium 100 MG CAPS Take 100 mg by mouth 2 (two) times daily. 60 capsule 0  . famotidine (PEPCID) 20 MG tablet Take 1 tablet (20 mg total) by mouth 2 (two) times daily. 30 tablet 0  . furosemide (LASIX) 40 MG tablet Take 1 tablet (40 mg total) by mouth 2 (two) times daily. 60 tablet 0  . hydrALAZINE (APRESOLINE) 50 MG tablet Take 1 tablet (50 mg total) by mouth 3 (three) times daily. 90 tablet 0  . insulin aspart (NOVOLOG) 100 UNIT/ML injection Inject 0-9 Units into the skin 3 (three) times daily with meals. Sliding scale CBG 70 - 120: 0 units CBG 121 - 150: 1  unit,  CBG 151 - 200: 2 units,  CBG 201 - 250: 3 units,  CBG 251 - 300: 5 units,  CBG 301 - 350: 7 units,  CBG 351 - 400: 9 units   CBG > 400: 9 units and notify your MD 10 mL 11  . insulin NPH-regular Human (NOVOLIN 70/30) (70-30) 100 UNIT/ML injection Inject 43 Units into the skin 2 (two) times daily with a meal. (Patient taking differently: Inject 40 Units into the skin 2 (two) times daily with a meal. ) 10 mL 11  . metoprolol tartrate (LOPRESSOR) 25 MG tablet Take 1 tablet (25 mg total) by mouth daily. 30 tablet 0  . Multiple Vitamin (MULTIVITAMIN WITH MINERALS) TABS tablet Take 1 tablet by mouth daily. 30 tablet 0  . polyethylene glycol (MIRALAX / GLYCOLAX) packet Take 17 g by mouth daily as needed for moderate constipation. 14 each 0  . QUEtiapine (SEROQUEL) 25 MG tablet Take 1 tablet (25 mg total) by mouth every morning. 30 tablet 0  . QUEtiapine (SEROQUEL) 50 MG tablet Take 1 tablet (50 mg total) by mouth at bedtime. 30 tablet 0  . HYDROcodone-acetaminophen (NORCO/VICODIN) 5-325 MG per tablet Take 1 tablet by mouth every 4 (four) hours as needed. (Patient not taking: Reported on 12/03/2014) 15 tablet 0  . oxyCODONE-acetaminophen (PERCOCET) 5-325 MG per tablet Take 1 tablet by mouth every 4 (four) hours as needed for moderate pain. (Patient not taking: Reported on 12/03/2014) 10 tablet 0    Musculoskeletal: Strength & Muscle Tone: decreased Gait & Station: unsteady Patient leans: N/A  Psychiatric Specialty Exam:     Blood pressure 143/73, pulse 88, temperature 98 F (36.7  C), temperature source Oral, resp. rate 14, SpO2 93 %.There is no weight on file to calculate BMI.  General Appearance: Disheveled  Eye Solicitor::  Fair  Speech:  Normal Rate  Volume:  Decreased  Mood:  Depressed  Affect:  Congruent  Thought Process:  Coherent  Orientation:  Full (Time, Place, and Person)  Thought Content:  Hallucinations: Auditory Visual  Suicidal Thoughts:  Yes.  with intent/plan  Homicidal  Thoughts:  No  Memory:  Immediate;   Fair Recent;   Fair Remote;   Fair  Judgement:  Fair  Insight:  Fair  Psychomotor Activity:  Decreased  Concentration:  Fair  Recall:  [employer] of Knowledge:Fair  Language: Fair  Akathisia:  No  Handed:  Right  AIMS (if indicated):     Assets:  Health and safety inspector Housing Leisure Time Resilience Social Support  ADL's:  Intact  Cognition: WNL  Sleep:      Medical Decision Making: Review of Psycho-Social Stressors (1), Review or order clinical lab tests (1) and Review of Medication Regimen & Side Effects (2)  Treatment Plan Summary: Daily contact with patient to assess and evaluate symptoms and progress in treatment, Medication management and Plan admit to inpatient psychiatric unit for stabilization  Plan:  Recommend psychiatric Inpatient admission when medically cleared. Disposition:  Eloise Levels, PMH-NP 12/06/2014 12:56 PM  Patient seen, evaluated and I agree with notes by Nurse Practitioner. Thedore Mins, MD

## 2014-12-07 LAB — CBG MONITORING, ED
GLUCOSE-CAPILLARY: 242 mg/dL — AB (ref 70–99)
Glucose-Capillary: 100 mg/dL — ABNORMAL HIGH (ref 70–99)

## 2014-12-07 NOTE — [facility] Suicide Risk Assessment (Signed)
Suicide Risk Assessment  Discharge Assessment   [facility]   Demographic Factors:  Male  Total Time spent with patient: 30 minutes  Musculoskeletal: Strength & Muscle Tone: decreased due to a stroke Gait & Station: unsteady, needs a walker Patient leans: N/A  Psychiatric Specialty Exam:     Blood pressure 136/57, pulse 85, temperature 98.2 F (36.8 C), temperature source Oral, resp. rate 16, SpO2 97 %.There is no weight on file to calculate BMI.  General Appearance: Casual  Eye Contact::  Good  Speech:  Normal Rate409  Volume:  Normal  Mood:  Euthymic  Affect:  Congruent  Thought Process:  Coherent  Orientation:  Full (Time, Place, and Person)  Thought Content:  Hallucinations: Auditory  Suicidal Thoughts:  No  Homicidal Thoughts:  No  Memory:  Immediate;   Good Recent;   Good Remote;   Good  Judgement:  Fair  Insight:  Fair  Psychomotor Activity:  Normal  Concentration:  Good  Recall:  Good  Fund of Knowledge:Good  Language: Good  Akathisia:  No  Handed:  Right  AIMS (if indicated):     Assets:  Housing Intimacy Leisure Time Physical Health Resilience Social Support  Sleep:     Cognition: WNL  ADL's:  Intact      Has this patient used any form of tobacco in the last 30 days? (Cigarettes, Smokeless Tobacco, Cigars, and/or Pipes) Yes, smoking cessation Rx and program offered, patient declined  Mental Status Per Nursing Assessment::   On Admission:   Cocaine abuse, hallucinations  Current Mental Status by Physician: NA  Loss Factors: NA  Historical Factors: NA  Risk Reduction Factors:   Sense of responsibility to family, Living with another person, especially a relative, Positive social support and Positive therapeutic relationship  Continued Clinical Symptoms:  Slight auditory hallucinations  Cognitive Features That Contribute To Risk:  None    Suicide Risk:  Minimal: No identifiable suicidal ideation.  Patients  presenting with no risk factors but with morbid ruminations; may be classified as minimal risk based on the severity of the depressive symptoms  Principal Problem: Depression, major, recurrent, severe with psychosis Discharge Diagnoses:  Patient Active Problem List   Diagnosis Date Noted  . Depression, major, recurrent, severe with psychosis [F33.3] 12/06/2014    Priority: High  . Cocaine abuse [F14.10] 02/19/2013    Priority: High  . Protein-calorie malnutrition, severe [E43] 07/28/2014  . Hyponatremia [E87.1] 07/27/2014  . Bacteremia [R78.81] 07/27/2014  . Acute respiratory failure [J96.00] 07/17/2014  . Subarachnoid hemorrhage [I60.9] 06/21/2014  . Hyperglycemia [R73.9] 01/27/2014  . 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 DM (diabetes mellitus), type 2, uncontrolled [E11.65] 02/13/2013  . Diabetes mellitus [E11.9] 12/08/2011  . Noncompliance [Z91.19] 12/08/2011  . CAD (coronary artery disease) [I25.10] 12/08/2011  . Chest pain [R07.9] 12/08/2011  . Chronic pain [G89.29]   . Gout [M10.9]   . Hypertension [I10]       Plan Of Care/Follow-up recommendations:  Activity:  as tolerated Diet:  heart healthy diet  Is patient on multiple antipsychotic therapies at discharge:  No   Has Patient had three or more failed trials of antipsychotic monotherapy by history:  No  Recommended Plan for Multiple Antipsychotic Therapies: NA    LORD, JAMISON, PMH-NP 12/07/2014, 11:35 AM

## 2014-12-07 NOTE — ED Notes (Signed)
Pt take all medications, new meal tray ordered due to food given to pt that he is allergic to. Pt now up in bed eating.

## 2014-12-07 NOTE — ED Notes (Signed)
Pt given insulin for glucose of 242, pt appears to be sleeping. Pt given all of belongings, no belongings left in locker. Pt being d/c home.

## 2014-12-07 NOTE — ED Notes (Signed)
Pt appears to be sleeping, sitter at bedside

## 2014-12-07 NOTE — Consult Note (Signed)
[facility] Face-to-Face Psychiatry Consult   Reason for Consult:  Command hallucinations Referring Physician:  EDP Patient Identification: [name] MRN:  [id number] Principal Diagnosis: Depression, major, recurrent, severe with psychosis Diagnosis:   Patient Active Problem List   Diagnosis Date Noted  . Depression, major, recurrent, severe with psychosis [F33.3] 12/06/2014    Priority: High  . Cocaine abuse [F14.10] 02/19/2013    Priority: High  . Protein-calorie malnutrition, severe [E43] 07/28/2014  . Hyponatremia [E87.1] 07/27/2014  . Bacteremia [R78.81] 07/27/2014  . Acute respiratory failure [J96.00] 07/17/2014  . Subarachnoid hemorrhage [I60.9] 06/21/2014  . Hyperglycemia [R73.9] 01/27/2014  . 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 DM (diabetes mellitus), type 2, uncontrolled [E11.65] 02/13/2013  . Diabetes mellitus [E11.9] 12/08/2011  . Noncompliance [Z91.19] 12/08/2011  . CAD (coronary artery disease) [I25.10] 12/08/2011  . Chest pain [R07.9] 12/08/2011  . Chronic pain [G89.29]   . Gout [M10.9]   . Hypertension [I10]     Total Time spent with patient: 30 minutes  Subjective:   [name] is a 59 y.o. male patient has stabilized and can discharge.  HPI:  The patient's medications were adjusted and today he states he is "hardly hearing voices at all."  Smiling and pleasant.  Denies suicidal/homicidal ideations.  He wants to discharge and follow-up with [facility] for his drug addiction issues.  [name] lives with his wife who is supportive and wants him to get treatment for his cocaine abuse.  [name] is stable for discharge. HPI Elements:   Location:  generalized . Quality:  acute. Severity:  severe. Timing:  constant. Duration:  few days. Context:  stressors.  Past Medical History:  Past Medical History  Diagnosis Date  . Diabetes mellitus   . Hypertension   . Gout   . MI (myocardial infarction)   . Chronic pain    right elbow  . Major depression, chronic   . Bipolar disorder   . Schizophrenia   . Subarachnoid hemorrhage september 2015  . Acute respiratory failure September 2015  . 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 Bacteremia   . Chronic pain   . Stroke     Past Surgical History  Procedure Laterality Date  . Coronary angioplasty with stent placement      2008  . Knee surgery      both  . Tee without cardioversion N/A 07/26/2014    Procedure: TRANSESOPHAGEAL ECHOCARDIOGRAM (TEE);  Surgeon: Lars Masson, MD;  Location: [facility] ENDOSCOPY;  Service: Cardiovascular;  Laterality: N/A;   Family History:  Family History  Problem Relation Age of Onset  . Diabetic kidney disease Mother   . Hypertension Mother   . Gout Mother   . Diabetic kidney disease Father   . Heart attack Brother 11   Social History:  History  Alcohol Use No    Comment: denies     History  Drug Use  . Yes  . Special: Cocaine    Comment: history of cocaine use, pt states last use was before stroke in September of this year.    History   Social History  . Marital Status: Divorced    Spouse Name: N/A  . Number of Children: N/A  . Years of Education: N/A   Social History Main Topics  . Smoking status: Former Smoker -- .1 years    Types: Cigarettes    Quit date: 01/07/2013  . Smokeless tobacco: Never Used  .  Alcohol Use: No     Comment: denies  . Drug Use: Yes    Special: Cocaine     Comment: history of cocaine use, pt states last use was before stroke in September of this year.  . Sexual Activity: Not on file   Other Topics Concern  . None   Social History Narrative   Additional Social History:                          Allergies:   Allergies  Allergen Reactions  . Fish Allergy     All seafood makes his throat swell  . Oatmeal     itching  . Tomato Anaphylaxis and Shortness Of Breath    Cannot breathe  . Lactose Intolerance (Gi) Diarrhea  and Nausea And Vomiting    Vitals: Blood pressure 132/79, pulse 71, temperature 98.2 F (36.8 C), temperature source Oral, resp. rate 20, SpO2 99 %.  Risk to Self: Suicidal Ideation: No-Not Currently/Within Last 6 Months Suicidal Intent: No-Not Currently/Within Last 6 Months Is patient at risk for suicide?: No Suicidal Plan?: No-Not Currently/Within Last 6 Months Access to Means: No What has been your use of drugs/alcohol within the last 12 months?: cocaine Intentional Self Injurious Behavior: None Risk to Others: Homicidal Ideation: No-Not Currently/Within Last 6 Months Current Homicidal Intent: No-Not Currently/Within Last 6 Months Current Homicidal Plan: No-Not Currently/Within Last 6 Months Access to Homicidal Means: No Assessment of Violence: None Noted Does patient have access to weapons?:  (unable to assess) Criminal Charges Pending?:  (unknown) Prior Inpatient Therapy: Prior Inpatient Therapy:  (unable to assess) Prior Outpatient Therapy:    Current Facility-Administered Medications  Medication Dose Route Frequency Provider Last Rate Last Dose  . acetaminophen (TYLENOL) tablet 650 mg  650 mg Oral Q4H PRN Vida Roller, MD   650 mg at 12/05/14 1610  . alum & mag hydroxide-simeth (MAALOX/MYLANTA) 200-200-20 MG/5ML suspension 30 mL  30 mL Oral PRN Vida Roller, MD      . amLODipine (NORVASC) tablet 10 mg  10 mg Oral Daily Vida Roller, MD   10 mg at 12/07/14 0945  . colchicine tablet 0.6 mg  0.6 mg Oral BID Vida Roller, MD   0.6 mg at 12/07/14 0945  . docusate sodium (COLACE) capsule 100 mg  100 mg Oral BID Vida Roller, MD   100 mg at 12/07/14 0945  . famotidine (PEPCID) tablet 20 mg  20 mg Oral BID Vida Roller, MD   20 mg at 12/07/14 0945  . furosemide (LASIX) tablet 40 mg  40 mg Oral BID Vida Roller, MD   40 mg at 12/07/14 0856  . hydrALAZINE (APRESOLINE) tablet 50 mg  50 mg Oral TID Vida Roller, MD   50 mg at 12/07/14 0945  . ibuprofen (ADVIL,MOTRIN)  tablet 600 mg  600 mg Oral Q8H PRN Vida Roller, MD   600 mg at 12/07/14 0351  . insulin aspart (novoLOG) injection 0-9 Units  0-9 Units Subcutaneous TID WC Nathan R. Rubin Payor, MD   3 Units at 12/07/14 1320  . insulin aspart protamine- aspart (NOVOLOG MIX 70/30) injection 40 Units  40 Units Subcutaneous BID WC Vida Roller, MD   40 Units at 12/07/14 [phone]  . LORazepam (ATIVAN) tablet 0-4 mg  0-4 mg Oral Q12H Vida Roller, MD   1 mg at 12/06/14 2141  . metoprolol tartrate (LOPRESSOR) tablet 25 mg  25 mg  Oral Daily Vida RollerBrian D Miller, MD   25 mg at 12/07/14 0945  . multivitamin with minerals tablet 1 tablet  1 tablet Oral Daily Vida RollerBrian D Miller, MD   1 tablet at 12/07/14 0945  . nicotine (NICODERM CQ - dosed in mg/24 hours) patch 21 mg  21 mg Transdermal Daily Vida RollerBrian D Miller, MD   21 mg at 12/04/14 0543  . ondansetron (ZOFRAN) tablet 4 mg  4 mg Oral Q8H PRN Vida RollerBrian D Miller, MD      . polyethylene glycol (MIRALAX / GLYCOLAX) packet 17 g  17 g Oral Daily PRN Vida RollerBrian D Miller, MD      . QUEtiapine (SEROQUEL) tablet 25 mg  25 mg Oral BID Nanine MeansJamison Lord, NP   25 mg at 12/07/14 0856  . QUEtiapine (SEROQUEL) tablet 50 mg  50 mg Oral QHS Vida RollerBrian D Miller, MD   50 mg at 12/06/14 2141   Current Outpatient Prescriptions  Medication Sig Dispense Refill  . amLODipine (NORVASC) 10 MG tablet Take 1 tablet (10 mg total) by mouth daily. 30 tablet 4  . colchicine 0.6 MG tablet Take 1 tablet (0.6 mg total) by mouth 2 (two) times daily. 60 tablet 0  . docusate sodium 100 MG CAPS Take 100 mg by mouth 2 (two) times daily. 60 capsule 0  . famotidine (PEPCID) 20 MG tablet Take 1 tablet (20 mg total) by mouth 2 (two) times daily. 30 tablet 0  . furosemide (LASIX) 40 MG tablet Take 1 tablet (40 mg total) by mouth 2 (two) times daily. 60 tablet 0  . hydrALAZINE (APRESOLINE) 50 MG tablet Take 1 tablet (50 mg total) by mouth 3 (three) times daily. 90 tablet 0  . insulin aspart (NOVOLOG) 100 UNIT/ML injection Inject 0-9 Units into the  skin 3 (three) times daily with meals. Sliding scale CBG 70 - 120: 0 units CBG 121 - 150: 1 unit,  CBG 151 - 200: 2 units,  CBG 201 - 250: 3 units,  CBG 251 - 300: 5 units,  CBG 301 - 350: 7 units,  CBG 351 - 400: 9 units   CBG > 400: 9 units and notify your MD 10 mL 11  . insulin NPH-regular Human (NOVOLIN 70/30) (70-30) 100 UNIT/ML injection Inject 43 Units into the skin 2 (two) times daily with a meal. (Patient taking differently: Inject 40 Units into the skin 2 (two) times daily with a meal. ) 10 mL 11  . metoprolol tartrate (LOPRESSOR) 25 MG tablet Take 1 tablet (25 mg total) by mouth daily. 30 tablet 0  . Multiple Vitamin (MULTIVITAMIN WITH MINERALS) TABS tablet Take 1 tablet by mouth daily. 30 tablet 0  . polyethylene glycol (MIRALAX / GLYCOLAX) packet Take 17 g by mouth daily as needed for moderate constipation. 14 each 0  . QUEtiapine (SEROQUEL) 25 MG tablet Take 1 tablet (25 mg total) by mouth every morning. 30 tablet 0  . QUEtiapine (SEROQUEL) 50 MG tablet Take 1 tablet (50 mg total) by mouth at bedtime. 30 tablet 0  . HYDROcodone-acetaminophen (NORCO/VICODIN) 5-325 MG per tablet Take 1 tablet by mouth every 4 (four) hours as needed. (Patient not taking: Reported on 12/03/2014) 15 tablet 0  . oxyCODONE-acetaminophen (PERCOCET) 5-325 MG per tablet Take 1 tablet by mouth every 4 (four) hours as needed for moderate pain. (Patient not taking: Reported on 12/03/2014) 10 tablet 0   Musculoskeletal: Strength & Muscle Tone: decreased due to a stroke Gait & Station: unsteady, needs a walker Patient leans: N/A  Psychiatric Specialty Exam:     Blood pressure 136/57, pulse 85, temperature 98.2 F (36.8 C), temperature source Oral, resp. rate 16, SpO2 97 %.There is no weight on file to calculate BMI.  General Appearance: Casual  Eye Contact::  Good  Speech:  Normal Rate409  Volume:  Normal  Mood:  Euthymic  Affect:  Congruent  Thought Process:  Coherent  Orientation:  Full (Time, Place, and  Person)  Thought Content:  Hallucinations: Auditory  Suicidal Thoughts:  No  Homicidal Thoughts:  No  Memory:  Immediate;   Good Recent;   Good Remote;   Good  Judgement:  Fair  Insight:  Fair  Psychomotor Activity:  Normal  Concentration:  Good  Recall:  Good  Fund of Knowledge:Good  Language: Good  Akathisia:  No  Handed:  Right  AIMS (if indicated):     Assets:  Housing Intimacy Leisure Time Physical Health Resilience Social Support  Sleep:     Cognition: WNL  ADL's:  Intact    Medical Decision Making: Review of Psycho-Social Stressors (1), Review or order clinical lab tests (1) and Review of Medication Regimen & Side Effects (2)  Treatment Plan Summary: Plan:  Discharge home to his wife and follow-up with [facility] Disposition:  Discharge home with follow-up at [facility] for his drug issues  Nanine Means, PMH-NP 12/07/2014 1:51 PM  Patient seen face-to-face for psychiatric evaluation, chart reviewed and case discussed with the physician extender and developed treatment plan. Reviewed the information documented and agree with the treatment plan. Thedore Mins, MD

## 2014-12-07 NOTE — ED Notes (Signed)
Pt appears to be sleeping. Sitter at bedside.

## 2014-12-07 NOTE — Discharge Instructions (Signed)
To help you maintain a sober lifestyle, a substance abuse treatment program may be beneficial to you.  [facility]  Contact them at your earliest opportunity to see about being admitted to their residential program:       [facility]      [address]      [place], [place] [address]      [phone]) [phone]  If [facility] not agree to [facility] you at this time, consider contacting [facility]  They offer a range of outpatient treatment programs to help people with addiction problems to maintain a sober lifestyle:       [facility]      [address]      [place], [place] [address]      [phone]

## 2014-12-21 ENCOUNTER — Encounter (HOSPITAL_COMMUNITY): Payer: Self-pay

## 2014-12-21 ENCOUNTER — Emergency Department (EMERGENCY_DEPARTMENT_HOSPITAL)
Admission: EM | Admit: 2014-12-21 | Discharge: 2014-12-22 | Disposition: A | Discharge status: Other Acute Inpatient Hospital | Payer: Medicaid Other | Source: Home / Self Care | Attending: Emergency Medicine | Admitting: Emergency Medicine

## 2014-12-21 ENCOUNTER — Emergency Department (HOSPITAL_COMMUNITY): Payer: Medicaid Other

## 2014-12-21 DIAGNOSIS — Z9861 Coronary angioplasty status: Secondary | ICD-10-CM

## 2014-12-21 DIAGNOSIS — E119 Type 2 diabetes mellitus without complications: Secondary | ICD-10-CM

## 2014-12-21 DIAGNOSIS — F333 Major depressive disorder, recurrent, severe with psychotic symptoms: Secondary | ICD-10-CM

## 2014-12-21 DIAGNOSIS — I252 Old myocardial infarction: Secondary | ICD-10-CM | POA: Insufficient documentation

## 2014-12-21 DIAGNOSIS — Z79899 Other long term (current) drug therapy: Secondary | ICD-10-CM | POA: Insufficient documentation

## 2014-12-21 DIAGNOSIS — G8929 Other chronic pain: Secondary | ICD-10-CM | POA: Insufficient documentation

## 2014-12-21 DIAGNOSIS — R443 Hallucinations, unspecified: Secondary | ICD-10-CM | POA: Insufficient documentation

## 2014-12-21 DIAGNOSIS — R45850 Homicidal ideations: Secondary | ICD-10-CM

## 2014-12-21 DIAGNOSIS — Z87891 Personal history of nicotine dependence: Secondary | ICD-10-CM

## 2014-12-21 DIAGNOSIS — M109 Gout, unspecified: Secondary | ICD-10-CM

## 2014-12-21 DIAGNOSIS — Z046 Encounter for general psychiatric examination, requested by authority: Secondary | ICD-10-CM | POA: Insufficient documentation

## 2014-12-21 DIAGNOSIS — F319 Bipolar disorder, unspecified: Secondary | ICD-10-CM | POA: Insufficient documentation

## 2014-12-21 DIAGNOSIS — Z8619 Personal history of other infectious and parasitic diseases: Secondary | ICD-10-CM | POA: Insufficient documentation

## 2014-12-21 DIAGNOSIS — Z8673 Personal history of transient ischemic attack (TIA), and cerebral infarction without residual deficits: Secondary | ICD-10-CM | POA: Insufficient documentation

## 2014-12-21 DIAGNOSIS — F209 Schizophrenia, unspecified: Secondary | ICD-10-CM

## 2014-12-21 DIAGNOSIS — Z794 Long term (current) use of insulin: Secondary | ICD-10-CM | POA: Insufficient documentation

## 2014-12-21 DIAGNOSIS — F1410 Cocaine abuse, uncomplicated: Secondary | ICD-10-CM | POA: Insufficient documentation

## 2014-12-21 DIAGNOSIS — I10 Essential (primary) hypertension: Secondary | ICD-10-CM

## 2014-12-21 DIAGNOSIS — Z008 Encounter for other general examination: Secondary | ICD-10-CM

## 2014-12-21 DIAGNOSIS — Z8709 Personal history of other diseases of the respiratory system: Secondary | ICD-10-CM | POA: Insufficient documentation

## 2014-12-21 LAB — CBC
HEMATOCRIT: 38.8 % — AB (ref 39.0–52.0)
Hemoglobin: 12.9 g/dL — ABNORMAL LOW (ref 13.0–17.0)
MCH: 28.9 pg (ref 26.0–34.0)
MCHC: 33.2 g/dL (ref 30.0–36.0)
MCV: 86.8 fL (ref 78.0–100.0)
PLATELETS: 356 10*3/uL (ref 150–400)
RBC: 4.47 MIL/uL (ref 4.22–5.81)
RDW: 14.1 % (ref 11.5–15.5)
WBC: 7.9 10*3/uL (ref 4.0–10.5)

## 2014-12-21 LAB — COMPREHENSIVE METABOLIC PANEL
ALBUMIN: 4.1 g/dL (ref 3.5–5.2)
ALT: 16 U/L (ref 0–53)
AST: 29 U/L (ref 0–37)
Alkaline Phosphatase: 80 U/L (ref 39–117)
Anion gap: 12 (ref 5–15)
BILIRUBIN TOTAL: 1 mg/dL (ref 0.3–1.2)
BUN: 41 mg/dL — ABNORMAL HIGH (ref 6–23)
CALCIUM: 8.8 mg/dL (ref 8.4–10.5)
CHLORIDE: 103 mmol/L (ref 96–112)
CO2: 20 mmol/L (ref 19–32)
Creatinine, Ser: 1.3 mg/dL (ref 0.50–1.35)
GFR calc Af Amer: 68 mL/min — ABNORMAL LOW (ref >90)
GFR calc non Af Amer: 59 mL/min — ABNORMAL LOW (ref >90)
Glucose, Bld: 295 mg/dL — ABNORMAL HIGH (ref 70–99)
Potassium: 3.9 mmol/L (ref 3.5–5.1)
Sodium: 135 mmol/L (ref 135–145)
Total Protein: 7.9 g/dL (ref 6.0–8.3)

## 2014-12-21 LAB — CBG MONITORING, ED
Glucose-Capillary: 271 mg/dL — ABNORMAL HIGH (ref 70–99)
Glucose-Capillary: 336 mg/dL — ABNORMAL HIGH (ref 70–99)
Glucose-Capillary: 171 mg/dL — ABNORMAL HIGH (ref 70–99)

## 2014-12-21 LAB — RAPID URINE DRUG SCREEN, HOSP PERFORMED
AMPHETAMINES: NOT DETECTED
BENZODIAZEPINES: NOT DETECTED
Barbiturates: NOT DETECTED
COCAINE: POSITIVE — AB
Opiates: NOT DETECTED
Tetrahydrocannabinol: NOT DETECTED

## 2014-12-21 LAB — I-STAT TROPONIN, ED: Troponin i, poc: 0.01 ng/mL (ref 0.00–0.08)

## 2014-12-21 LAB — ETHANOL

## 2014-12-21 MED ORDER — FAMOTIDINE 20 MG PO TABS
20.0000 mg | ORAL_TABLET | Freq: Two times a day (BID) | ORAL | Status: DC
  Administered 2014-12-21: 20 mg via ORAL
  Filled 2014-12-21: qty 1

## 2014-12-21 MED ORDER — FUROSEMIDE 40 MG PO TABS
40.0000 mg | ORAL_TABLET | Freq: Two times a day (BID) | ORAL | Status: DC
  Administered 2014-12-21: 40 mg via ORAL
  Filled 2014-12-21 (×2): qty 1

## 2014-12-21 MED ORDER — HYDRALAZINE HCL 50 MG PO TABS
50.0000 mg | ORAL_TABLET | Freq: Three times a day (TID) | ORAL | Status: DC
  Administered 2014-12-21 (×2): 50 mg via ORAL
  Filled 2014-12-21 (×2): qty 1

## 2014-12-21 MED ORDER — QUETIAPINE FUMARATE 25 MG PO TABS: 25.0000 mg | ORAL_TABLET | Freq: Every morning | ORAL | Status: DC

## 2014-12-21 MED ORDER — METOPROLOL TARTRATE 25 MG PO TABS: 25.0000 mg | ORAL_TABLET | Freq: Every day | ORAL | Status: DC

## 2014-12-21 MED ORDER — INSULIN ASPART PROT & ASPART (70-30 MIX) 100 UNIT/ML ~~LOC~~ SUSP
43.0000 [IU] | Freq: Two times a day (BID) | SUBCUTANEOUS | Status: DC
  Administered 2014-12-21: 43 [IU] via SUBCUTANEOUS
  Filled 2014-12-21: qty 10

## 2014-12-21 MED ORDER — INSULIN ASPART 100 UNIT/ML ~~LOC~~ SOLN
0.0000 [IU] | Freq: Three times a day (TID) | SUBCUTANEOUS | Status: DC
Start: 2014-12-22 — End: 2014-12-22

## 2014-12-21 MED ORDER — DOCUSATE SODIUM 100 MG PO CAPS
100.0000 mg | ORAL_CAPSULE | Freq: Two times a day (BID) | ORAL | Status: DC
  Administered 2014-12-21: 100 mg via ORAL
  Filled 2014-12-21: qty 1

## 2014-12-21 MED ORDER — QUETIAPINE FUMARATE 50 MG PO TABS
50.0000 mg | ORAL_TABLET | Freq: Every day | ORAL | Status: DC
  Administered 2014-12-21: 50 mg via ORAL
  Filled 2014-12-21: qty 1

## 2014-12-21 MED ORDER — AMLODIPINE BESYLATE 10 MG PO TABS
10.0000 mg | ORAL_TABLET | Freq: Every day | ORAL | Status: DC
  Administered 2014-12-21: 10 mg via ORAL
  Filled 2014-12-21: qty 1

## 2014-12-21 NOTE — BH Assessment (Addendum)
Assessment Note  [name] is an 59 y.o. male with history of Major Depression, Bipolar Disorder, and Schizophrenia. TTS consult ordered for this patient. Writer attempted to assess patient. Patient was sleeping soundly as Clinical research associate enter the room. Writer made several attempts to awaken patient. Unfortunately, he was not able to participate in the assessment as patient was drowsy. Patient mumbles, "It's to much leave me alone".  Collateral Information from ED notes (ED providers, nursing staff, etc.):  Patient presents emergency department with chief complaint of auditory hallucinationsPt [place] the [place] bus station and told them that he was out of his medications and he's trying to get away from the voices. Pt denies SI or HI, however states that if he doesn't get off the streets and away from the voices, he's afraid that that he will get hurt or someone will.Patient states that he has been hearing voices. States the voices are telling him to "stop him and get him off." He states that he normally takes medications to help with the hallucinations, but he has not had any. He states that he stays on the street. He denies any alcohol use. He states that he does use drugs, but is unable to specify which. He states that he uses "anything that helps with the pain."     Axis I: Schizophrenia; Bipolar Disorder, Substance Induced Mood Disorder ?? Axis II: Deferred Axis III:  Past Medical History  Diagnosis Date  . Diabetes mellitus   . Hypertension   . Gout   . MI (myocardial infarction)   . Chronic pain     right elbow  . Major depression, chronic   . Bipolar disorder   . Schizophrenia   . Subarachnoid hemorrhage september 2015  . Acute respiratory failure September 2015  . 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 Bacteremia   . Chronic pain   . Stroke    Axis IV: other psychosocial or environmental problems, problems related to social  environment and problems with access to health care services Axis V: 31-40 impairment in reality testing  Past Medical History:  Past Medical History  Diagnosis Date  . Diabetes mellitus   . Hypertension   . Gout   . MI (myocardial infarction)   . Chronic pain     right elbow  . Major depression, chronic   . Bipolar disorder   . Schizophrenia   . Subarachnoid hemorrhage september 2015  . Acute respiratory failure September 2015  . 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 Bacteremia   . Chronic pain   . Stroke     Past Surgical History  Procedure Laterality Date  . Coronary angioplasty with stent placement      2008  . Knee surgery      both  . Tee without cardioversion N/A 07/26/2014    Procedure: TRANSESOPHAGEAL ECHOCARDIOGRAM (TEE);  Surgeon: Lars Masson, MD;  Location: [facility] ENDOSCOPY;  Service: Cardiovascular;  Laterality: N/A;    Family History:  Family History  Problem Relation Age of Onset  . Diabetic kidney disease Mother   . Hypertension Mother   . Gout Mother   . Diabetic kidney disease Father   . Heart attack Brother 76    Social History:  reports that he quit smoking about 1 years ago. His smoking use included Cigarettes. He quit after .1 years of use. He has never used  smokeless tobacco. He reports that he uses illicit drugs (Cocaine). He reports that he does not drink alcohol.  Additional Social History:  Alcohol / Drug Use Pain Medications: SEE MAR Prescriptions: SEE MAR Over the Counter: SEE MAR History of alcohol / drug use?: No history of alcohol / drug abuse (BAL negative; UDS has not posted as of 12/21/2014 0818)  CIWA: CIWA-Ar BP: 132/86 mmHg Pulse Rate: 93 COWS:    Allergies:  Allergies  Allergen Reactions  . Fish Allergy     All seafood makes his throat swell  . Oatmeal     itching  . Tomato Anaphylaxis and Shortness Of Breath    Cannot breathe  . Lactose Intolerance (Gi) Diarrhea and  Nausea And Vomiting    Home Medications:  (Not in a hospital admission)  OB/GYN Status:  No LMP for male patient.  General Assessment Data Location of Assessment: [facility] ED Is this a Tele or Face-to-Face Assessment?: Face-to-Face Is this an Initial Assessment or a Re-assessment for this encounter?: Initial Assessment Living Arrangements: Spouse/significant other Can pt return to current living arrangement?: Yes Admission Status: Voluntary Is patient capable of signing voluntary admission?: Yes Transfer from: Home Referral Source: Self/Family/Friend  Medical Screening Exam [facility] Walk-in ONLY) Medical Exam completed: Yes  [facility] Plan Living Arrangements: Spouse/significant other Name of Psychiatrist: none Name of Therapist: none  Education Status Is patient currently in school?: No Highest grade of school patient has completed: unknown  Risk to self with the past 6 months Suicidal Ideation:  (Unable to confirm or deny) Suicidal Intent:  (unable to confirm or deny) Is patient at risk for suicide?:  (unk) Suicidal Plan?:  (unk) Access to Means: No What has been your use of drugs/alcohol within the last 12 months?:  (per previous assessment patient reported coccaine use; no UD) Previous Attempts/Gestures:  (unk) How many times?:  (unk) Other Self Harm Risks:  (unk) Triggers for Past Attempts: Unknown Intentional Self Injurious Behavior: None Family Suicide History: Unable to assess Recent stressful life event(s): Other (Comment) (unable to assess ) Persecutory voices/beliefs?: No Depression:  (unable to assess ) Depression Symptoms:  (unk) Substance abuse history and/or treatment for substance abuse?: Yes Suicide prevention information given to non-admitted patients: Not applicable  Risk to Others within the past 6 months Homicidal Ideation:  (Unable to confirm or deny) Thoughts of Harm to Others:  (unk ) Current Homicidal Intent:  (unk) Current Homicidal Plan:   (unk) Access to Homicidal Means:  (unk) Identified Victim:  (unk) History of harm to others?:  (unk) Assessment of Violence:  (unk) Violent Behavior Description:  (unk) Does patient have access to weapons?:  (unk ) Criminal Charges Pending?:  (unk) Does patient have a court date:  (unk)  Psychosis Hallucinations: Auditory (Voices stating, "Stop him get him off"; Visual-Unk) Delusions: Unspecified (n/a)  Mental Status Report Appear/Hygiene: In scrubs Eye Contact: Unable to Assess Motor Activity: Unable to assess Speech: Slurred, Slow Level of Consciousness: Drowsy Mood: Other (Comment) Affect: Unable to Assess Anxiety Level:  (UTA) Thought Processes: Unable to Assess Judgement: Unable to Assess Orientation: Unable to assess Obsessive Compulsive Thoughts/Behaviors: Unable to Assess  Cognitive Functioning Concentration: Unable to Assess Memory: Unable to Assess IQ: Average Insight: Unable to Assess Impulse Control: Unable to Assess Appetite: Fair Weight Loss:  (n/a) Weight Gain:  (n/a) Sleep: Unable to Assess Total Hours of Sleep:  (UTA) Vegetative Symptoms: Unable to Assess  ADLScreening [facility] Assessment Services) Patient's cognitive ability adequate to safely complete daily  activities?: Yes (unk) Patient able to express need for assistance with ADLs?: Yes (unk) Independently performs ADLs?: Yes (appropriate for developmental age) (unk)  Prior Inpatient Therapy Prior Inpatient Therapy:  (Unable to assess ) Prior Therapy Dates:  (unk) Prior Therapy Facilty/Provider(s): unk Reason for Treatment: unk  Prior Outpatient Therapy Prior Outpatient Therapy:  (Unk) Prior Therapy Dates:  (unk) Prior Therapy Facilty/Provider(s): unk  Reason for Treatment: unk  ADL Screening (condition at time of admission) Patient's cognitive ability adequate to safely complete daily activities?: Yes (unk) Is the patient deaf or have difficulty hearing?:  (unk) Does the patient have  difficulty seeing, even when wearing glasses/contacts?:  (unk) Does the patient have difficulty concentrating, remembering, or making decisions?:  (unk) Patient able to express need for assistance with ADLs?: Yes (unk) Does the patient have difficulty dressing or bathing?:  (unk) Independently performs ADLs?: Yes (appropriate for developmental age) (unk) Does the patient have difficulty walking or climbing stairs?:  (unk) Weakness of Legs:  (unk) Weakness of Arms/Hands:  (unk)  Home Assistive Devices/Equipment Home Assistive Devices/Equipment:  (unk)    Abuse/Neglect Assessment (Assessment to be complete while patient is alone) Physical Abuse:  (unk) Verbal Abuse:  (unk) Sexual Abuse:  (unk) Exploitation of patient/patient's resources: Denies Self-Neglect: Denies Values / Beliefs Cultural Requests During Hospitalization: None Spiritual Requests During Hospitalization: None   Advance Directives (For Healthcare) Does patient have an advance directive?: No Would patient like information on creating an advanced directive?: No - patient declined information    Additional Information 1:1 In Past 12 Months?: No CIRT Risk: No Elopement Risk: No Does patient have medical clearance?: Yes     Disposition:  Disposition Initial Assessment Completed for this Encounter: Yes Disposition of Patient: Other dispositions (Disposition pending psych consult this am) Other disposition(s): Other (Comment)  On Site Evaluation by:   Reviewed with Physician:    Melynda Rippleerry, Annina Piotrowski [facility] 12/21/2014 8:28 AM

## 2014-12-21 NOTE — ED Notes (Signed)
Pt denies SI/HI. Pt reports hearing voice telling him that it is "going to kill him and others." Pt paranoid of thoughts but calm and cooperative during assessment.

## 2014-12-21 NOTE — ED Notes (Signed)
[place] [facility] [place].

## 2014-12-21 NOTE — ED Provider Notes (Signed)
CSN: [id number]     Arrival date & time 12/21/14  0449 History   First MD Initiated Contact with Patient 12/21/14 0622     Chief Complaint  Patient presents with  . Medical Clearance     (Consider location/radiation/quality/duration/timing/severity/associated sxs/prior Treatment) HPI Comments: Patient presents emergency department with chief complaint of auditory hallucinations. Patient states that he has been hearing voices. States the voices are telling him to "stop him and get him off." He states that he normally takes medications to help with the hallucinations, but he has not had any. He states that he stays on the street. He denies any alcohol use. He states that he does use drugs, but is unable to specify which. He states that he uses "anything that helps with the pain." He states that he has back pain which is been present for "a long time." He also complains of chest pain that started 20 minutes ago. He denies any shortness of breath, or cough. Denies any fevers chills. Denies any abdominal pain, nausea, vomiting, or diarrhea. No aggravating or alleviating factors. Patient denies any SI or HI.  The history is provided by the patient. No language interpreter was used.    Past Medical History  Diagnosis Date  . Diabetes mellitus   . Hypertension   . Gout   . MI (myocardial infarction)   . Chronic pain     right elbow  . Major depression, chronic   . Bipolar disorder   . Schizophrenia   . Subarachnoid hemorrhage september 2015  . Acute respiratory failure September 2015  . 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 Bacteremia   . Chronic pain   . Stroke    Past Surgical History  Procedure Laterality Date  . Coronary angioplasty with stent placement      2008  . Knee surgery      both  . Tee without cardioversion N/A 07/26/2014    Procedure: TRANSESOPHAGEAL ECHOCARDIOGRAM (TEE);  Surgeon: Lars MassonKatarina H Nelson, MD;  [facility];  Service: Cardiovascular;  Laterality: N/A;   Family History  Problem Relation Age of Onset  . Diabetic kidney disease Mother   . Hypertension Mother   . Gout Mother   . Diabetic kidney disease Father   . Heart attack Brother 37   History  Substance Use Topics  . Smoking status: Former Smoker -- .1 years    Types: Cigarettes    Quit date: 01/07/2013  . Smokeless tobacco: Never Used  . Alcohol Use: No     Comment: denies    Review of Systems  Constitutional: Negative for fever and chills.  Respiratory: Negative for shortness of breath.   Cardiovascular: Positive for chest pain.  Gastrointestinal: Negative for nausea, vomiting, diarrhea and constipation.  Genitourinary: Negative for dysuria.  Psychiatric/Behavioral: Positive for hallucinations.  All other systems reviewed and are negative.     Allergies  Fish allergy; Oatmeal; Tomato; and Lactose intolerance (gi)  Home Medications   Prior to Admission medications   Medication Sig Start Date End Date Taking? Authorizing Provider  amLODipine (NORVASC) 10 MG tablet Take 1 tablet (10 mg total) by mouth daily. 08/01/14  Yes Ripudeep Jenna LuoK Rai, MD  docusate sodium 100 MG CAPS Take 100 mg by mouth 2 (two) times daily. 08/01/14  Yes Ripudeep Jenna LuoK Rai, MD  famotidine (PEPCID) 20 MG tablet Take 1 tablet (20 mg total) by mouth 2 (two) times daily. 10/24/14  Yes Misty StanleyLisa  Sherrill Raring, PA-C  furosemide (LASIX) 40 MG tablet Take 1 tablet (40 mg total) by mouth 2 (two) times daily. 08/01/14  Yes Ripudeep Jenna Luo, MD  hydrALAZINE (APRESOLINE) 50 MG tablet Take 1 tablet (50 mg total) by mouth 3 (three) times daily. 08/01/14  Yes Ripudeep Jenna Luo, MD  insulin aspart (NOVOLOG) 100 UNIT/ML injection Inject 0-9 Units into the skin 3 (three) times daily with meals. Sliding scale CBG 70 - 120: 0 units CBG 121 - 150: 1 unit,  CBG 151 - 200: 2 units,  CBG 201 - 250: 3 units,  CBG 251 - 300: 5 units,  CBG 301 - 350: 7 units,  CBG 351 - 400: 9 units   CBG >  400: 9 units and notify your MD 08/01/14  Yes Ripudeep Jenna Luo, MD  insulin NPH-regular Human (NOVOLIN 70/30) (70-30) 100 UNIT/ML injection Inject 43 Units into the skin 2 (two) times daily with a meal. Patient taking differently: Inject 40 Units into the skin 2 (two) times daily with a meal.  08/01/14  Yes Ripudeep K Rai, MD  metoprolol tartrate (LOPRESSOR) 25 MG tablet Take 1 tablet (25 mg total) by mouth daily. 08/01/14  Yes Ripudeep Jenna Luo, MD  Multiple Vitamin (MULTIVITAMIN WITH MINERALS) TABS tablet Take 1 tablet by mouth daily. 08/01/14  Yes Ripudeep Jenna Luo, MD  QUEtiapine (SEROQUEL) 25 MG tablet Take 1 tablet (25 mg total) by mouth every morning. 08/23/14  Yes Gerhard Munch, MD  QUEtiapine (SEROQUEL) 50 MG tablet Take 1 tablet (50 mg total) by mouth at bedtime. 08/23/14  Yes Gerhard Munch, MD  colchicine 0.6 MG tablet Take 1 tablet (0.6 mg total) by mouth 2 (two) times daily. Patient not taking: Reported on 12/21/2014 09/17/14   Dione Booze, MD  HYDROcodone-acetaminophen (NORCO/VICODIN) 5-325 MG per tablet Take 1 tablet by mouth every 4 (four) hours as needed. Patient not taking: Reported on 12/21/2014 08/24/14   Kathie Dike, PA-C  oxyCODONE-acetaminophen (PERCOCET) 5-325 MG per tablet Take 1 tablet by mouth every 4 (four) hours as needed for moderate pain. Patient not taking: Reported on 12/03/2014 09/17/14   Dione Booze, MD  polyethylene glycol [facility] Ethelene Hal) packet Take 17 g by mouth daily as needed for moderate constipation. Patient not taking: Reported on 12/21/2014 08/01/14   Ripudeep K Rai, MD   BP 140/78 mmHg  Pulse 103  Temp(Src) 98.1 F (36.7 C) (Oral)  Resp 18  SpO2 98% Physical Exam  Constitutional: He is oriented to person, place, and time. He appears well-developed and well-nourished.  HENT:  Head: Normocephalic and atraumatic.  Eyes: Conjunctivae and EOM are normal. Pupils are equal, round, and reactive to light. Right eye exhibits no discharge. Left eye exhibits no  discharge. No scleral icterus.  Neck: Normal range of motion. Neck supple. No JVD present.  Cardiovascular: Normal rate, regular rhythm and normal heart sounds.  Exam reveals no gallop and no friction rub.   No murmur heard. Pulmonary/Chest: Effort normal and breath sounds normal. No respiratory distress. He has no wheezes. He has no rales. He exhibits no tenderness.  Abdominal: Soft. He exhibits no distension and no mass. There is no tenderness. There is no rebound and no guarding.  Musculoskeletal: Normal range of motion. He exhibits no edema or tenderness.  Neurological: He is alert and oriented to person, place, and time.  Skin: Skin is warm and dry.  Psychiatric: He has a normal mood and affect. His behavior is normal. Judgment and thought content normal.  Nursing note and vitals reviewed.   ED Course  Procedures (including critical care time) Results for orders placed or performed during the hospital encounter of 12/21/14  CBC  Result Value Ref Range   WBC 7.9 4.0 - 10.5 K/uL   RBC 4.47 4.22 - 5.81 MIL/uL   Hemoglobin 12.9 (L) 13.0 - 17.0 g/dL   HCT 16.1 (L) 09.6 - 04.5 %   MCV 86.8 78.0 - 100.0 fL   MCH 28.9 26.0 - 34.0 pg   MCHC 33.2 30.0 - 36.0 g/dL   RDW 40.9 81.1 - 91.4 %   Platelets 356 150 - 400 K/uL  Comprehensive metabolic panel  Result Value Ref Range   Sodium 135 135 - 145 mmol/L   Potassium 3.9 3.5 - 5.1 mmol/L   Chloride 103 96 - 112 mmol/L   CO2 20 19 - 32 mmol/L   Glucose, Bld 295 (H) 70 - 99 mg/dL   BUN 41 (H) 6 - 23 mg/dL   Creatinine, Ser 7.82 0.50 - 1.35 mg/dL   Calcium 8.8 8.4 - 95.6 mg/dL   Total Protein 7.9 6.0 - 8.3 g/dL   Albumin 4.1 3.5 - 5.2 g/dL   AST 29 0 - 37 U/L   ALT 16 0 - 53 U/L   Alkaline Phosphatase 80 39 - 117 U/L   Total Bilirubin 1.0 0.3 - 1.2 mg/dL   GFR calc non Af Amer 59 (L) >90 mL/min   GFR calc Af Amer 68 (L) >90 mL/min   Anion gap 12 5 - 15  Ethanol (ETOH)  Result Value Ref Range   Alcohol, Ethyl (B) <5 0 - 9 mg/dL   CBG monitoring, ED  Result Value Ref Range   Glucose-Capillary 271 (H) 70 - 99 mg/dL  I-Stat Troponin, ED (not at [facility])  Result Value Ref Range   Troponin i, poc 0.01 0.00 - 0.08 ng/mL   Comment 3           Dg Chest 2 View  12/21/2014   CLINICAL DATA:  Medical clearance.  Schizophrenic disorder.  EXAM: CHEST  2 VIEW  COMPARISON:  09/16/2014.  FINDINGS: The heart size and mediastinal contours are within normal limits. Both lungs are clear. The visualized skeletal structures are unremarkable. Stable appearance from priors.  IMPRESSION: No active cardiopulmonary disease.   Electronically Signed   By: Davonna Belling M.D.   On: 12/21/2014 07:19   Dg Knee Complete 4 Views Right  12/03/2014   CLINICAL DATA:  59 year old male with right knee pain after fall 2 days ago. Initial encounter.  EXAM: RIGHT KNEE - COMPLETE 4+ VIEW  COMPARISON:  08/24/2014.  FINDINGS: chronic injury of the patellar tendon with dystrophic calcification and patella Alta. Moderate to severe medial and lateral compartment joint degeneration superimposed. No acute fracture or dislocation identified. Advanced calcified peripheral vascular disease in the right lower extremity.  IMPRESSION: 1. 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 2. Moderate to severe medial and lateral compartment joint degeneration. 3.  No acute osseous abnormality identified. 4. Advanced calcified peripheral vascular disease.   Electronically Signed   By: Odessa Fleming M.D.   On: 12/03/2014 20:56      EKG Interpretation   Date/Time:  Wednesday December 21 2014 07:02:34 EST Ventricular Rate:  94 PR Interval:  159 QRS Duration: 93 QT Interval:  379 QTC Calculation: 474 R Axis:   62 Text Interpretation:  Sinus rhythm Nonspecific T wave abnormality  Borderline repolarization abnormality since  last tracing no significant  change Abnormal ECG Confirmed by MILLER  MD, BRIAN ([id number]) on 12/21/2014  7:22:14 AM       MDM   Final diagnoses:  Encounter for medical assessment  Hallucinations    Patient with auditory hallucinations. States that he has not been taking his medications. Denies any SI or HI.  Will consult TTS for evaluation.  Additionally, patient complains of 20 minutes of chest pain this morning.  Will check EKG, troponin, and CXR.  Low suspicion for ACS or PE.  No tachycardia, hypoxia, recent surgery or travel.     3:33 PM Disposition pending psych evaluation.  Roxy Horseman, PA-C 12/21/14 1533  Vida Roller, MD 12/21/14 2123

## 2014-12-21 NOTE — ED Notes (Signed)
Pt continues to rest. 

## 2014-12-21 NOTE — Consult Note (Signed)
[facility] Face-to-Face Psychiatry Consult   Reason for Consult:  Homicidal ideations Referring Physician:  EDP Patient Identification: [name] MRN:  [id number] Principal Diagnosis: Depression, major, recurrent, severe with psychosis Diagnosis:   Patient Active Problem List   Diagnosis Date Noted  . Homicidal ideations [R45.850] 12/21/2014    Priority: High  . Depression, major, recurrent, severe with psychosis [F33.3] 12/06/2014    Priority: High  . Cocaine abuse [F14.10] 02/19/2013    Priority: High  . Protein-calorie malnutrition, severe [E43] 07/28/2014  . Hyponatremia [E87.1] 07/27/2014  . Bacteremia [R78.81] 07/27/2014  . Acute respiratory failure [J96.00] 07/17/2014  . Subarachnoid hemorrhage [I60.9] 06/21/2014  . Hyperglycemia [R73.9] 01/27/2014  . 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 DM (diabetes mellitus), type 2, uncontrolled [E11.65] 02/13/2013  . Diabetes mellitus [E11.9] 12/08/2011  . Noncompliance [Z91.19] 12/08/2011  . CAD (coronary artery disease) [I25.10] 12/08/2011  . Chest pain [R07.9] 12/08/2011  . Chronic pain [G89.29]   . Gout [M10.9]   . Hypertension [I10]     Total Time spent with patient: 45 minutes  Subjective:   [name] is a 59 y.o. male patient admitted with homicidal ideations.  HPI:  The patient came to the ED after auditory hallucinations increased to the point of him wanting to hurt someone.  He called the police to take him off the streets because he was not safe.   Akiva has not had his psychiatric medications and inconsistent care since his prison release last February.  He was also abusing cocaine last night.  Jovontae's big stressor is being homeless with limited resources.  Denies suicidal ideations and alcohol abuse.   HPI Elements:   Location:  generalized. Quality:  acute. Severity:  severe. Timing:  constant. Duration:  couple of days. Context:  no medications, cocaine  abuse.  Past Medical History:  Past Medical History  Diagnosis Date  . Diabetes mellitus   . Hypertension   . Gout   . MI (myocardial infarction)   . Chronic pain     right elbow  . Major depression, chronic   . Bipolar disorder   . Schizophrenia   . Subarachnoid hemorrhage september 2015  . Acute respiratory failure September 2015  . 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 Bacteremia   . Chronic pain   . Stroke     Past Surgical History  Procedure Laterality Date  . Coronary angioplasty with stent placement      2008  . Knee surgery      both  . Tee without cardioversion N/A 07/26/2014    Procedure: TRANSESOPHAGEAL ECHOCARDIOGRAM (TEE);  Surgeon: Lars Masson, MD;  [facility]: [facility] ENDOSCOPY;  Service: Cardiovascular;  Laterality: N/A;   Family History:  Family History  Problem Relation Age of Onset  . Diabetic kidney disease Mother   . Hypertension Mother   . Gout Mother   . Diabetic kidney disease Father   . Heart attack Brother 48   Social History:  History  Alcohol Use No    Comment: denies     History  Drug Use  . Yes  . Special: Cocaine    Comment: history of cocaine use, pt states last use was before stroke in September of this year.    History   Social History  . Marital Status: Divorced    Spouse Name: N/A  . Number of Children: N/A  . Years of Education: N/A  Social History Main Topics  . Smoking status: Former Smoker -- .1 years    Types: Cigarettes    Quit date: 01/07/2013  . Smokeless tobacco: Never Used  . Alcohol Use: No     Comment: denies  . Drug Use: Yes    Special: Cocaine     Comment: history of cocaine use, pt states last use was before stroke in September of this year.  . Sexual Activity: Not on file   Other Topics Concern  . None   Social History Narrative   Additional Social History:    Pain Medications: SEE MAR Prescriptions: SEE MAR Over the Counter: SEE MAR History  of alcohol / drug use?: No history of alcohol / drug abuse (BAL negative; UDS has not posted as of 12/21/2014 0818)                     Allergies:   Allergies  Allergen Reactions  . Fish Allergy     All seafood makes his throat swell  . Oatmeal     itching  . Tomato Anaphylaxis and Shortness Of Breath    Cannot breathe  . Lactose Intolerance (Gi) Diarrhea and Nausea And Vomiting    Vitals: Blood pressure 134/85, pulse 91, temperature 97.8 F (36.6 C), temperature source Oral, resp. rate 18, SpO2 100 %.  Risk to Self: Suicidal Ideation:  (Unable to confirm or deny) Suicidal Intent:  (unable to confirm or deny) Is patient at risk for suicide?:  (unk) Suicidal Plan?:  (unk) Access to Means: No What has been your use of drugs/alcohol within the last 12 months?:  (per previous assessment patient reported coccaine use; no UD) How many times?:  (unk) Other Self Harm Risks:  (unk) Triggers for Past Attempts: Unknown Intentional Self Injurious Behavior: None Risk to Others: Homicidal Ideation:  (Unable to confirm or deny) Thoughts of Harm to Others:  (unk ) Current Homicidal Intent:  (unk) Current Homicidal Plan:  (unk) Access to Homicidal Means:  (unk) Identified Victim:  (unk) History of harm to others?:  (unk) Assessment of Violence:  (unk) Violent Behavior Description:  (unk) Does patient have access to weapons?:  (unk ) Criminal Charges Pending?:  (unk) Does patient have a court date:  (unk) Prior Inpatient Therapy: Prior Inpatient Therapy:  (Unable to assess ) Prior Therapy Dates:  (unk) Prior Therapy Facilty/Provider(s): unk Reason for Treatment: unk Prior Outpatient Therapy: Prior Outpatient Therapy:  (Unk) Prior Therapy Dates:  (unk) Prior Therapy Facilty/Provider(s): unk  Reason for Treatment: unk  Current Facility-Administered Medications  Medication Dose Route Frequency Provider Last Rate Last Dose  . amLODipine (NORVASC) tablet 10 mg  10 mg Oral Daily  Nanine MeansJamison Lord, NP   10 mg at 12/21/14 1817  . docusate sodium (COLACE) capsule 100 mg  100 mg Oral BID Nanine MeansJamison Lord, NP      . famotidine (PEPCID) tablet 20 mg  20 mg Oral BID Nanine MeansJamison Lord, NP      . furosemide (LASIX) tablet 40 mg  40 mg Oral BID Nanine MeansJamison Lord, NP   40 mg at 12/21/14 1818  . hydrALAZINE (APRESOLINE) tablet 50 mg  50 mg Oral TID Nanine MeansJamison Lord, NP   50 mg at 12/21/14 1817  . [START ON 12/22/2014] insulin aspart (novoLOG) injection 0-9 Units  0-9 Units Subcutaneous TID WC Nanine MeansJamison Lord, NP      . insulin aspart protamine- aspart (NOVOLOG MIX 70/30) injection 43 Units  43 Units Subcutaneous BID WC Nanine MeansJamison Lord, NP  43 Units at 12/21/14 1819  . [START ON 12/22/2014] metoprolol tartrate (LOPRESSOR) tablet 25 mg  25 mg Oral Daily Nanine Means, NP      . Melene Muller ON 12/22/2014] QUEtiapine (SEROQUEL) tablet 25 mg  25 mg Oral q morning - 10a Nanine Means, NP      . QUEtiapine (SEROQUEL) tablet 50 mg  50 mg Oral QHS Nanine Means, NP       Current Outpatient Prescriptions  Medication Sig Dispense Refill  . amLODipine (NORVASC) 10 MG tablet Take 1 tablet (10 mg total) by mouth daily. 30 tablet 4  . docusate sodium 100 MG CAPS Take 100 mg by mouth 2 (two) times daily. 60 capsule 0  . famotidine (PEPCID) 20 MG tablet Take 1 tablet (20 mg total) by mouth 2 (two) times daily. 30 tablet 0  . furosemide (LASIX) 40 MG tablet Take 1 tablet (40 mg total) by mouth 2 (two) times daily. 60 tablet 0  . hydrALAZINE (APRESOLINE) 50 MG tablet Take 1 tablet (50 mg total) by mouth 3 (three) times daily. 90 tablet 0  . insulin aspart (NOVOLOG) 100 UNIT/ML injection Inject 0-9 Units into the skin 3 (three) times daily with meals. Sliding scale CBG 70 - 120: 0 units CBG 121 - 150: 1 unit,  CBG 151 - 200: 2 units,  CBG 201 - 250: 3 units,  CBG 251 - 300: 5 units,  CBG 301 - 350: 7 units,  CBG 351 - 400: 9 units   CBG > 400: 9 units and notify your MD 10 mL 11  . insulin NPH-regular Human (NOVOLIN 70/30) (70-30) 100 UNIT/ML  injection Inject 43 Units into the skin 2 (two) times daily with a meal. (Patient taking differently: Inject 40 Units into the skin 2 (two) times daily with a meal. ) 10 mL 11  . metoprolol tartrate (LOPRESSOR) 25 MG tablet Take 1 tablet (25 mg total) by mouth daily. 30 tablet 0  . Multiple Vitamin (MULTIVITAMIN WITH MINERALS) TABS tablet Take 1 tablet by mouth daily. 30 tablet 0  . QUEtiapine (SEROQUEL) 25 MG tablet Take 1 tablet (25 mg total) by mouth every morning. 30 tablet 0  . QUEtiapine (SEROQUEL) 50 MG tablet Take 1 tablet (50 mg total) by mouth at bedtime. 30 tablet 0  . colchicine 0.6 MG tablet Take 1 tablet (0.6 mg total) by mouth 2 (two) times daily. (Patient not taking: Reported on 12/21/2014) 60 tablet 0  . HYDROcodone-acetaminophen (NORCO/VICODIN) 5-325 MG per tablet Take 1 tablet by mouth every 4 (four) hours as needed. (Patient not taking: Reported on 12/21/2014) 15 tablet 0  . oxyCODONE-acetaminophen (PERCOCET) 5-325 MG per tablet Take 1 tablet by mouth every 4 (four) hours as needed for moderate pain. (Patient not taking: Reported on 12/03/2014) 10 tablet 0  . polyethylene glycol (MIRALAX / GLYCOLAX) packet Take 17 g by mouth daily as needed for moderate constipation. (Patient not taking: Reported on 12/21/2014) 14 each 0    Musculoskeletal: Strength & Muscle Tone: within normal limits Gait & Station: unsteady Patient leans: N/A  Psychiatric Specialty Exam:     Blood pressure 134/85, pulse 91, temperature 97.8 F (36.6 C), temperature source Oral, resp. rate 18, SpO2 100 %.There is no weight on file to calculate BMI.  General Appearance: Casual  Eye Contact::  Good  Speech:  Normal Rate  Volume:  Normal  Mood:  Depressed and Irritable  Affect:  Congruent  Thought Process:  Coherent  Orientation:  Full (Time, Place, and Person)  Thought Content:  Hallucinations: Auditory  Suicidal Thoughts:  No  Homicidal Thoughts:  Yes.  with intent/plan  Memory:  Immediate;    Fair Recent;   Fair Remote;   Fair  Judgement:  Fair  Insight:  Fair  Psychomotor Activity:  Decreased  Concentration:  Fair  Recall:  [employer] of Knowledge:Fair  Language: Fair  Akathisia:  No  Handed:  Right  AIMS (if indicated):     Assets:  Leisure Time Resilience  ADL's:  Intact  Cognition: WNL  Sleep:      Medical Decision Making: Review of Psycho-Social Stressors (1), Review or order clinical lab tests (1) and Review of Medication Regimen & Side Effects (2)  Treatment Plan Summary: Daily contact with patient to assess and evaluate symptoms and progress in treatment, Medication management and Plan admit to inpatient hospitalization for stabilization  Plan:  Recommend psychiatric Inpatient admission when medically cleared. Disposition: Eloise Levels, PMH-NP 12/21/2014 6:41 PM Patient seen, evaluated by me, treatment plan formulated by me.To restart patient's medications to stabilize him.  ALSO PATIENT NEEDS AN act TEAM AS HE DOES NOT FOLLOW UP IN THE COMMUNITY AND PRESENTS TO ed WHEN OUT OF MEDICATIONS. Nelly Rout, MD

## 2014-12-21 NOTE — Progress Notes (Addendum)
  CARE MANAGEMENT ED NOTE 12/21/2014  Patient:  [name],[name]   Account Number:  [id number]  Date Initiated:  12/21/2014  Documentation initiated by:  Edd ArbourGIBBS,KIMBERLY  Subjective/Objective Assessment:   59 yr old medicaid [place] access [place] told them that he was out of his medications and he's trying to get away from the voices.        Subjective/Objective Assessment Detail:   dx Depression, major, recurrent, severe with psychosis  pcp [facility] CM unable to find pt has made past & future appointments to [facility] - [facility] now on walk in services     Action/Plan:   ED CM consulted by ED SW on d/c plans to have p4CC to assist with pcp & ACT services CM & Sw confirmed [facility] [place] [facility] see notes below   Action/Plan Detail:   Anticipated DC Date:  12/21/2014     Status Recommendation to Physician:   Result of Recommendation:    Other ED Services  Consult Working Plan   In-house referral  Clinical Social Worker   DC Associate [employer]anning Services  Other  Outpatient Services - Pt will follow up  PCP issues    Choice offered to / List presented to:            Status of service:  Completed, signed off  ED Comments:   ED Comments Detail:  12/21/14 1100 ED CM spoke with pt about assigned pcp and provided with resources for copy of listed EPIC medicaid response hx indicating [facility] as new pcp &  DSS to discuss change if he prefers med[place] access response states pcp is [facility] [address] [place]  [phone] In EPIC Cm noted [facility] listed as pcp Cm removed this and entered assigned provider CM entered f/u at d/c to be with assigned medicaid pcp   Entered in d/c f/u section if pt prefers another pcp he is to f/u with DSS [phone] to request assist with change in pcps   Cm noted pt nodding in and out during CM interaction with pt.  CM unable to determine if pt was comprehending or not  Left written  information/resources at bedside  Follow-up With Details Why Contact Info [facility]   If you prefer another pcp please follow up with DSS [phone] to request assist with change in pcps  [address] [place] [id number] [phone]

## 2014-12-21 NOTE — BH Assessment (Addendum)
[facility] under the care of Dr. Elna BreslowEappen to arrive after midnight per [facility].   Support paperwork faxed. RN informed. Will inform Dr. Closer to transport per request of Dr. Effie ShyWentz.   Attempted to inform wife per pt request [phone] Elease Hashimotoatricia but there was no answer.    Clista BernhardtNancy Charlise Giovanetti, [facility] Triage Specialist 12/21/2014 7:34 PM

## 2014-12-21 NOTE — ED Notes (Addendum)
Pt has in belonging bag:  Red zipp up jacket, blue khaki, white socks, dark blue t-shirt, black shoes, pink pj pants, and two small insulin bottles

## 2014-12-21 NOTE — ED Notes (Signed)
Patient transported to X-ray 

## 2014-12-21 NOTE — ED Notes (Signed)
Pt denies SI or HI, however states that if he doesn't get off the streets and away from the voices, he's afraid that that he will get hurt or someone will.

## 2014-12-21 NOTE — Progress Notes (Signed)
CSW spoke with [facility], Risa GrillChiquita Striblin who shares that patient is currently active with transitional care team. Per Michael Bostonhiquita patient went to pcp Dr. Ronne BinningMcKenzie on Friday 2/26 at 5pm for blood pressure management. CSW and transitional care team discussed patietn needing to switch medicaid assigned doctor to Dr. Ronne BinningMcKenzie if that's where he would like to continue, and would assist with process. Patient does not qualify for actt team at this time as patient has not had any inpatient treatments and only 2 ed visit related to psychiatric needs in the past 6 months. Patient transitional care team will help assist with pt needs. Pt has been wiling to go [facility] however has not done so yet, however [facility] plans to encourage patient to do so upon dc. [facility] also [facility] and have medications set up to be delieved at home. Per [facility] patient lives at home with his wife. Pt to follow up with Palermohiquita at [phone] upon discharge.   Olga CoasterKristen Edith Groleau, LCSW  Clinical Social Work  [facility] [phone]  12/21/2014 1:26 PM .

## 2014-12-21 NOTE — ED Notes (Signed)
[place] that he was out of his medications and he's trying to get away from the voices.

## 2014-12-21 NOTE — Progress Notes (Signed)
Pt to be referred to [facility] for transitional care team to assist with follow up with PCP and actt team.   Justin CoasterKristen Analena Gama, LCSW  Clinical Social Work  [facility] [phone]  12/21/2014 10:24 AM

## 2014-12-21 NOTE — ED Notes (Signed)
Pt sleeping at present, no distress noted, will continue to monitor for safety. 

## 2014-12-22 ENCOUNTER — Encounter (HOSPITAL_COMMUNITY): Payer: Self-pay

## 2014-12-22 ENCOUNTER — Inpatient Hospital Stay (HOSPITAL_COMMUNITY)
Admission: AD | Admit: 2014-12-22 | Discharge: 2014-12-29 | DRG: 882 | Disposition: A | Discharge status: Home / Self Care | Payer: Medicaid Other | Source: Intra-hospital | Attending: Psychiatry | Admitting: Psychiatry

## 2014-12-22 DIAGNOSIS — F410 Panic disorder [episodic paroxysmal anxiety] without agoraphobia: Secondary | ICD-10-CM | POA: Diagnosis present

## 2014-12-22 DIAGNOSIS — I252 Old myocardial infarction: Secondary | ICD-10-CM

## 2014-12-22 DIAGNOSIS — F4310 Post-traumatic stress disorder, unspecified: Principal | ICD-10-CM | POA: Diagnosis present

## 2014-12-22 DIAGNOSIS — F14251 Cocaine dependence with cocaine-induced psychotic disorder with hallucinations: Secondary | ICD-10-CM | POA: Diagnosis present

## 2014-12-22 DIAGNOSIS — I10 Essential (primary) hypertension: Secondary | ICD-10-CM | POA: Diagnosis present

## 2014-12-22 DIAGNOSIS — F319 Bipolar disorder, unspecified: Secondary | ICD-10-CM | POA: Diagnosis present

## 2014-12-22 DIAGNOSIS — Z87891 Personal history of nicotine dependence: Secondary | ICD-10-CM

## 2014-12-22 DIAGNOSIS — F14250 Cocaine dependence with cocaine-induced psychotic disorder with delusions: Secondary | ICD-10-CM

## 2014-12-22 DIAGNOSIS — Z62810 Personal history of physical and sexual abuse in childhood: Secondary | ICD-10-CM | POA: Diagnosis present

## 2014-12-22 DIAGNOSIS — Z8673 Personal history of transient ischemic attack (TIA), and cerebral infarction without residual deficits: Secondary | ICD-10-CM | POA: Diagnosis not present

## 2014-12-22 DIAGNOSIS — Z8249 Family history of ischemic heart disease and other diseases of the circulatory system: Secondary | ICD-10-CM | POA: Diagnosis not present

## 2014-12-22 DIAGNOSIS — F1420 Cocaine dependence, uncomplicated: Secondary | ICD-10-CM | POA: Diagnosis present

## 2014-12-22 DIAGNOSIS — F24 Shared psychotic disorder: Secondary | ICD-10-CM | POA: Diagnosis present

## 2014-12-22 DIAGNOSIS — Z993 Dependence on wheelchair: Secondary | ICD-10-CM | POA: Diagnosis not present

## 2014-12-22 DIAGNOSIS — R45850 Homicidal ideations: Secondary | ICD-10-CM

## 2014-12-22 DIAGNOSIS — F515 Nightmare disorder: Secondary | ICD-10-CM | POA: Diagnosis present

## 2014-12-22 DIAGNOSIS — R45851 Suicidal ideations: Secondary | ICD-10-CM | POA: Diagnosis present

## 2014-12-22 DIAGNOSIS — F1590 Other stimulant use, unspecified, uncomplicated: Secondary | ICD-10-CM

## 2014-12-22 DIAGNOSIS — F14222 Cocaine dependence with intoxication with perceptual disturbance: Secondary | ICD-10-CM | POA: Diagnosis present

## 2014-12-22 DIAGNOSIS — F209 Schizophrenia, unspecified: Secondary | ICD-10-CM | POA: Diagnosis present

## 2014-12-22 DIAGNOSIS — F14151 Cocaine abuse with cocaine-induced psychotic disorder with hallucinations: Secondary | ICD-10-CM

## 2014-12-22 DIAGNOSIS — I2510 Atherosclerotic heart disease of native coronary artery without angina pectoris: Secondary | ICD-10-CM | POA: Diagnosis present

## 2014-12-22 LAB — GLUCOSE, CAPILLARY
GLUCOSE-CAPILLARY: 230 mg/dL — AB (ref 70–99)
GLUCOSE-CAPILLARY: 251 mg/dL — AB (ref 70–99)
Glucose-Capillary: 236 mg/dL — ABNORMAL HIGH (ref 70–99)
Glucose-Capillary: 227 mg/dL — ABNORMAL HIGH (ref 70–99)
Glucose-Capillary: 304 mg/dL — ABNORMAL HIGH (ref 70–99)
Glucose-Capillary: 112 mg/dL — ABNORMAL HIGH (ref 70–99)

## 2014-12-22 MED ORDER — COLCHICINE 0.6 MG PO TABS
0.6000 mg | ORAL_TABLET | Freq: Two times a day (BID) | ORAL | Status: DC
  Administered 2014-12-22 – 2014-12-29 (×15): 0.6 mg via ORAL
  Filled 2014-12-22 (×21): qty 1

## 2014-12-22 MED ORDER — INSULIN ASPART PROT & ASPART (70-30 MIX) 100 UNIT/ML ~~LOC~~ SUSP
44.0000 [IU] | Freq: Two times a day (BID) | SUBCUTANEOUS | Status: DC
  Administered 2014-12-22 – 2014-12-28 (×13): 44 [IU] via SUBCUTANEOUS

## 2014-12-22 MED ORDER — BENZTROPINE MESYLATE 0.5 MG PO TABS
0.5000 mg | ORAL_TABLET | Freq: Two times a day (BID) | ORAL | Status: DC
  Administered 2014-12-22 – 2014-12-29 (×14): 0.5 mg via ORAL
  Filled 2014-12-22 (×10): qty 1
  Filled 2014-12-22 (×2): qty 8
  Filled 2014-12-22: qty 1
  Filled 2014-12-22: qty 8
  Filled 2014-12-22 (×6): qty 1
  Filled 2014-12-22: qty 8

## 2014-12-22 MED ORDER — AMLODIPINE BESYLATE 10 MG PO TABS
10.0000 mg | ORAL_TABLET | Freq: Every day | ORAL | Status: DC
  Administered 2014-12-22 – 2014-12-29 (×8): 10 mg via ORAL
  Filled 2014-12-22 (×10): qty 1

## 2014-12-22 MED ORDER — INSULIN ASPART 100 UNIT/ML ~~LOC~~ SOLN
0.0000 [IU] | Freq: Three times a day (TID) | SUBCUTANEOUS | Status: DC
  Administered 2014-12-22: 5 [IU] via SUBCUTANEOUS
  Administered 2014-12-22: 8 [IU] via SUBCUTANEOUS
  Administered 2014-12-22: 11 [IU] via SUBCUTANEOUS
  Administered 2014-12-23: 3 [IU] via SUBCUTANEOUS
  Administered 2014-12-23: 5 [IU] via SUBCUTANEOUS
  Administered 2014-12-23: 11 [IU] via SUBCUTANEOUS
  Administered 2014-12-24: 3 [IU] via SUBCUTANEOUS
  Administered 2014-12-24: 15 [IU] via SUBCUTANEOUS
  Administered 2014-12-24: 8 [IU] via SUBCUTANEOUS
  Administered 2014-12-25 (×2): 5 [IU] via SUBCUTANEOUS
  Administered 2014-12-25: 15 [IU] via SUBCUTANEOUS
  Administered 2014-12-26: 11 [IU] via SUBCUTANEOUS
  Administered 2014-12-26: 2 [IU] via SUBCUTANEOUS
  Administered 2014-12-26 – 2014-12-27 (×3): 5 [IU] via SUBCUTANEOUS

## 2014-12-22 MED ORDER — QUETIAPINE FUMARATE 50 MG PO TABS
50.0000 mg | ORAL_TABLET | Freq: Every day | ORAL | Status: DC
  Filled 2014-12-22: qty 1

## 2014-12-22 MED ORDER — FAMOTIDINE 20 MG PO TABS
20.0000 mg | ORAL_TABLET | Freq: Two times a day (BID) | ORAL | Status: DC
  Administered 2014-12-22 – 2014-12-29 (×15): 20 mg via ORAL
  Filled 2014-12-22 (×20): qty 1

## 2014-12-22 MED ORDER — FUROSEMIDE 40 MG PO TABS
40.0000 mg | ORAL_TABLET | Freq: Two times a day (BID) | ORAL | Status: DC
  Administered 2014-12-22 – 2014-12-29 (×15): 40 mg via ORAL
  Filled 2014-12-22 (×19): qty 1

## 2014-12-22 MED ORDER — ALUM & MAG HYDROXIDE-SIMETH 200-200-20 MG/5ML PO SUSP: 30.0000 mL | ORAL | Status: DC | PRN

## 2014-12-22 MED ORDER — HYDRALAZINE HCL 50 MG PO TABS
50.0000 mg | ORAL_TABLET | Freq: Three times a day (TID) | ORAL | Status: DC
  Administered 2014-12-22 – 2014-12-29 (×22): 50 mg via ORAL
  Filled 2014-12-22 (×2): qty 1
  Filled 2014-12-22: qty 2
  Filled 2014-12-22 (×26): qty 1

## 2014-12-22 MED ORDER — QUETIAPINE FUMARATE 25 MG PO TABS
25.0000 mg | ORAL_TABLET | Freq: Every morning | ORAL | Status: DC
  Administered 2014-12-22: 25 mg via ORAL
  Filled 2014-12-22 (×3): qty 1

## 2014-12-22 MED ORDER — ACETAMINOPHEN 325 MG PO TABS
650.0000 mg | ORAL_TABLET | Freq: Four times a day (QID) | ORAL | Status: DC | PRN
  Administered 2014-12-22: 650 mg via ORAL
  Filled 2014-12-22: qty 2

## 2014-12-22 MED ORDER — DOCUSATE SODIUM 100 MG PO CAPS
100.0000 mg | ORAL_CAPSULE | Freq: Two times a day (BID) | ORAL | Status: DC
  Administered 2014-12-22 – 2014-12-29 (×15): 100 mg via ORAL
  Filled 2014-12-22 (×19): qty 1

## 2014-12-22 MED ORDER — HALOPERIDOL 5 MG PO TABS
5.0000 mg | ORAL_TABLET | Freq: Two times a day (BID) | ORAL | Status: DC
  Administered 2014-12-22 – 2014-12-23 (×2): 5 mg via ORAL
  Filled 2014-12-22 (×4): qty 1

## 2014-12-22 MED ORDER — TRAZODONE HCL 50 MG PO TABS: 50.0000 mg | ORAL_TABLET | Freq: Every evening | ORAL | Status: DC | PRN

## 2014-12-22 MED ORDER — ADULT MULTIVITAMIN W/MINERALS CH
1.0000 | ORAL_TABLET | Freq: Every day | ORAL | Status: DC
  Administered 2014-12-22 – 2014-12-29 (×8): 1 via ORAL
  Filled 2014-12-22 (×10): qty 1

## 2014-12-22 MED ORDER — METOPROLOL TARTRATE 25 MG PO TABS
25.0000 mg | ORAL_TABLET | Freq: Every day | ORAL | Status: DC
  Administered 2014-12-22 – 2014-12-29 (×8): 25 mg via ORAL
  Filled 2014-12-22 (×10): qty 1

## 2014-12-22 MED ORDER — INSULIN ASPART 100 UNIT/ML ~~LOC~~ SOLN: 0.0000 [IU] | Freq: Three times a day (TID) | SUBCUTANEOUS | Status: DC

## 2014-12-22 MED ORDER — POLYETHYLENE GLYCOL 3350 17 G PO PACK: 17.0000 g | PACK | Freq: Every day | ORAL | Status: DC | PRN

## 2014-12-22 MED ORDER — MAGNESIUM HYDROXIDE 400 MG/5ML PO SUSP: 30.0000 mL | Freq: Every day | ORAL | Status: DC | PRN

## 2014-12-22 MED ORDER — SERTRALINE HCL 25 MG PO TABS
25.0000 mg | ORAL_TABLET | Freq: Every day | ORAL | Status: DC
  Administered 2014-12-22 – 2014-12-26 (×5): 25 mg via ORAL
  Filled 2014-12-22 (×7): qty 1

## 2014-12-22 MED ORDER — INSULIN ASPART 100 UNIT/ML ~~LOC~~ SOLN
0.0000 [IU] | Freq: Every day | SUBCUTANEOUS | Status: DC
  Administered 2014-12-23: 2 [IU] via SUBCUTANEOUS
  Administered 2014-12-24: 3 [IU] via SUBCUTANEOUS
  Administered 2014-12-25 – 2014-12-26 (×2): 4 [IU] via SUBCUTANEOUS

## 2014-12-22 NOTE — Tx Team (Signed)
Interdisciplinary Treatment Plan Update (Adult)  Date: 12/22/2014 Time Reviewed: 10:15 AM  Progress in Treatment:  Attending groups: Yes  Participating in groups: Yes  Taking medication as prescribed: Yes  Tolerating medication: Yes  Family/Significant othe contact made: No, not yet  Patient understands diagnosis: Yes, AEB seeking help for AVH, SI and HI.  Discussing patient identified problems/goals with staff: Yes  Medical problems stabilized or resolved: Yes  Denies suicidal/homicidal ideation: No, but contracts for safety Patient has not harmed self or Others: Yes  New problem(s) identified: NA Discharge Plan or Barriers: Pt wants a referral to an inpatient substance abuse treatment program. CSW assessing.  Additional comments:Patient is a 59 year old AAM who is in a wheel chair , has a hx of cocaine abuse as well as PTSD and several medical problems including stroke x2 as well as HTN ,Diabetes . Patient called police and told then that he was hearing voices and that he will hurt some one or hurt self. [facility]  Patient this AM was found in bed. It was hard to wake him up , he had difficulty opening his eyes , appeared to be drowsy and would respond to questions minimally. Per staff he had woken up for breakfast . Later on patient woke up after 2-3 hrs and was found sitting in his wheel chair. He reported feeling sleepy from the medications given last night. Patient reported that he had been abusing a lot of cocaine . Patient requested that he needs help and wants to go to a residential facility for his substance abuse. Patient reported having AH that was frustrating and challenging. Patient reported having passive SI as well as HI [facility] Patient is on SSD. Patient got kicked out of his house by his wife recently and has no place to go to.  Haldol, Zoloft trial   Pt and CSW Intern reviewed  pt's identified goals and treatment plan. Pt verbalized understanding and agreed to treatment plan  Reason for Continuation of Hospitalization:  Medication stabilization Depression Suicidal Ideation Homicidal Ideation  Substance abuse   Estimated length of stay: 4-5 days   Attendees:  Patient:  12/22/2014 10:15 AM   Family:  3/3/201610:15 AM   Physician: Dr. Elna BreslowEappen MD  3/3/201610:15 AM  Nursing: Lamount Crankerhris Judge, RN  12/22/2014 10:15 AM  Clinical Social Worker: Daryel Geraldodney Solara Goodchild, [place]  3/3/201610:15 AM  Clinical Social Worker: Charleston Ropesandace Hyatt, CSW Intern 3/3/201610:15 AM  Other:  3/3/201610:15 AM  Other:  3/3/201610:15 AM  Other:  3/3/201610:15 AM  Scribe for Treatment Team:  Charleston Ropesandace Hyatt, CSW Intern 12/22/2014 10:15 AM

## 2014-12-22 NOTE — Progress Notes (Signed)
Patient ID: Coralie KeensLarry B Shakoor, male   DOB: 05/01/1956, 59 y.o.   MRN: [id number]  Admission Note:    D:[age] yr male who presents VC in no acute distress for the treatment of Psychosis and non-compliance of medications. Pt appears flat and depressed. Pt was calm and cooperative with admission process.  Pt denies SI/HI, +ve AVH . Pt wanted to come in to get away from the voices, pt has been out of his medications for "some time" now. Pt was given his night medications before coming over so pt was very lethargic, and answered minimal questions.   A: Skin was assessed and found to be clear of any abnormal marks apart from a scar on L-temple area. POC and unit policies explained and understanding verbalized. Consents obtained. Food and fluids offered, and  Accepted. R:  Pt had no additional questions or concerns.

## 2014-12-22 NOTE — [facility] Suicide Risk Assessment (Signed)
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 Cocaine use disorder, severe, dependence [F14.20] 12/22/2014  . Protein-calorie malnutrition, severe [E43] 07/28/2014  . Hyponatremia [E87.1] 07/27/2014  . Bacteremia [R78.81] 07/27/2014  . Subarachnoid hemorrhage [I60.9] 06/21/2014  . DM (diabetes mellitus), type 2, uncontrolled [E11.65] 02/13/2013  . Diabetes mellitus [E11.9] 12/08/2011  . Noncompliance [Z91.19] 12/08/2011  . CAD (coronary artery disease) [I25.10] 12/08/2011  . Chest pain [R07.9] 12/08/2011  . Chronic pain [G89.29]   . Gout [M10.9]   . Hypertension [I10]      Continued Clinical Symptoms:  Alcohol Use Disorder Identification Test Final Score (AUDIT): 5 The "Alcohol Use Disorders Identification Test", Guidelines for Use in Primary Care, Second Edition.  World Science writerHealth Organization [facility](WHO). Score between 0-7:  no or low risk or alcohol related problems. Score between 8-15:  moderate risk of alcohol related problems. Score between 16-19:  high risk of alcohol related problems. Score 20 or above:  warrants further diagnostic evaluation for alcohol dependence and treatment.   CLINICAL FACTORS:   Alcohol/Substance Abuse/Dependencies Currently Psychotic Previous Psychiatric Diagnoses and Treatments Medical Diagnoses and Treatments/Surgeries   Musculoskeletal: Strength & Muscle Tone: within normal limits Gait & Station: in a wheel chair Patient leans: N/A  Psychiatric Specialty  Exam: Physical Exam  ROS  Blood pressure 135/70, pulse 100, temperature 97.9 F (36.6 C), temperature source Oral, resp. rate 16, height 5\' 6"  (1.676 m), weight 72.576 kg (160 lb), SpO2 98 %.Body mass index is 25.84 kg/(m^2).    Please see H&P.  SUICIDE RISK:   Moderate:  Frequent suicidal ideation with limited intensity, and duration, some specificity in terms of plans, no associated intent, good self-control, limited dysphoria/symptomatology, some risk factors present, and identifiable protective factors, including available and accessible social support.  PLAN OF CARE:Please see H&P.   Medical Decision Making:  Review of Psycho-Social Stressors (1), Review or order clinical lab tests (1), Review and summation of old records (2), New Problem, with no additional work-up planned (3), Review of Last Therapy Session (1), Review of Medication Regimen & Side Effects (2) and Review of New Medication or Change in Dosage (2)  I certify that inpatient services furnished can reasonably be expected to improve the patient's condition.   Emberlynn Riggan MD 12/22/2014, 1:15 PM

## 2014-12-22 NOTE — [facility] Group Notes (Signed)
[facility] Group Notes:  (Nursing/MHT/Case Management/Adjunct)  Date:  12/22/2014  Time:  0930am  Type of Therapy:  Nurse Education  Participation Level:  Did Not Attend  Participation Quality:  Did no attend  Affect:  Did not attend  Cognitive:  Did not attend  Insight:  None  Engagement in Group:  Did not attend  Modes of Intervention:  Discussion, Education and Support  Summary of Progress/Problems: Pt was invited to group but did not attend and remained in bed and appears to be asleep.  Lendell CapriceGuthrie, Arlenis Blaydes A 12/22/2014, 10:40 AM

## 2014-12-22 NOTE — Progress Notes (Signed)
D: Patient is asleep and drowsy this morning, staff attempted to wake pt 4 times. Pt responds at times with mumbles but refuses to open his eyes. MD Eappen at bedside attempting to wake pt as well and was unsuccessful. Per night staff, pt was up this morning and went to breakfast. Pt not attending unit groups. Pt woke up around 1130am. Pt denies SI. Pt reports HI, pt reports he AH that tell him to hurt others. Pt reports VH of "dark figures" standing over him at times. Pt is requesting in-patient treatment for alcohol and cocaine use. Pt complains of chronic lower back pain this afternoon, 7/10. Pt remains in bed the majority of the day. A: Cold cloth placed on pt's head per MD Eappen's request. Additional CBG and vitals taken, per providers verbal request (See results review and doc flowsheet-vitals). MD aware that pt has not taken 0800 medications at scheduled time, medications later administered (See MAR). Fluids encouraged and given, per providers orders. Active listening by RN. Encouragement/Support provided to pt. PRN medication administered for pain per providers orders (See MAR). 15 minute checks continued per protocol for patient safety.  R: Pt verbally contracts not to harm others and agrees to come to staff with increased intensity of command hallucinations. Patient cooperative and receptive to nursing interventions. Pt remains safe.
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
## 2014-12-22 NOTE — Plan of Care (Signed)
Problem: Ineffective individual coping Goal: STG: Patient will remain free from self harm Outcome: Progressing Patient remains free from self harm. 15 minute checks continued per protocol for patient safety.   Problem: Alteration in thought process Goal: STG-Patient does not respond to command hallucinations Outcome: Progressing Patient has not responded to command hallucinations today and pt agrees to come to staff with increased intensity of hallucinations.

## 2014-12-22 NOTE — Tx Team (Signed)
Initial Interdisciplinary Treatment Plan   PATIENT STRESSORS: Financial difficulties Health problems Medication change or noncompliance Substance abuse   PATIENT STRENGTHS: Ability for insight General fund of knowledge Motivation for treatment/growth Supportive family/friends   PROBLEM LIST: Problem List/Patient Goals Date to be addressed Date deferred Reason deferred Estimated date of resolution  Risk for suicide 12/22/14     AVH 12/22/14     SA 12/22/14     [facility] 12/22/14                                    DISCHARGE CRITERIA:  Adequate post-discharge living arrangements Improved stabilization in mood, thinking, and/or behavior Verbal commitment to aftercare and medication compliance  PRELIMINARY DISCHARGE PLAN: Attend aftercare/continuing care group Attend PHP/IOP Outpatient therapy  PATIENT/FAMIILY INVOLVEMENT: This treatment plan has been presented to and reviewed with the patient, [name].  The patient and family have been given the opportunity to ask questions and make suggestions.  [name], [name] A 12/22/2014, 4:06 AM

## 2014-12-22 NOTE — ED Notes (Signed)
[facility] to RN Cal[facility]icoharity[facility] at 1 am.

## 2014-12-22 NOTE — [facility] Group Notes (Signed)
[facility] Group Notes:  (Counselor/Nursing/MHT/Case Management/Adjunct)  12/22/2014 1:15PM  Type of Therapy:  Group Therapy  Participation Level:  Invited.  Chose to not attend.  Summary of Progress/Problems: The topic for group was balance in life.  Pt participated in the discussion about when their life was in balance and out of balance and how this feels.  Pt discussed ways to get back in balance and short term goals they can work on to get where they want to be.    Daryel Geraldorth, Eilah Common B 12/22/2014 10:55 AM

## 2014-12-23 LAB — LIPID PANEL
CHOLESTEROL: 184 mg/dL (ref 0–200)
HDL: 53 mg/dL (ref >39)
LDL Cholesterol: 106 mg/dL — ABNORMAL HIGH (ref 0–99)
Total CHOL/HDL Ratio: 3.5 RATIO
Triglycerides: 125 mg/dL (ref <150)
VLDL: 25 mg/dL (ref 0–40)

## 2014-12-23 LAB — GLUCOSE, CAPILLARY
GLUCOSE-CAPILLARY: 207 mg/dL — AB (ref 70–99)
GLUCOSE-CAPILLARY: 244 mg/dL — AB (ref 70–99)
Glucose-Capillary: 156 mg/dL — ABNORMAL HIGH (ref 70–99)
Glucose-Capillary: 320 mg/dL — ABNORMAL HIGH (ref 70–99)

## 2014-12-23 MED ORDER — TRAZODONE HCL 100 MG PO TABS
100.0000 mg | ORAL_TABLET | Freq: Every day | ORAL | Status: DC
  Administered 2014-12-23 – 2014-12-28 (×6): 100 mg via ORAL
  Filled 2014-12-23: qty 1
  Filled 2014-12-23: qty 4
  Filled 2014-12-23: qty 1
  Filled 2014-12-23: qty 4
  Filled 2014-12-23 (×5): qty 1

## 2014-12-23 MED ORDER — HALOPERIDOL 5 MG PO TABS
10.0000 mg | ORAL_TABLET | Freq: Every evening | ORAL | Status: DC
  Administered 2014-12-23 – 2014-12-28 (×6): 10 mg via ORAL
  Filled 2014-12-23 (×8): qty 2

## 2014-12-23 MED ORDER — HALOPERIDOL 5 MG PO TABS
5.0000 mg | ORAL_TABLET | Freq: Every day | ORAL | Status: DC
  Administered 2014-12-24 – 2014-12-29 (×7): 5 mg via ORAL
  Filled 2014-12-23 (×3): qty 1
  Filled 2014-12-23 (×2): qty 12
  Filled 2014-12-23 (×4): qty 1

## 2014-12-23 NOTE — Progress Notes (Signed)
D: Patient sleeping on approach. Pt difficult to awaken.  Pt opened eyes but closed it within seconds. Pt finally woke up after several tries and requested snack and a drink. Pt mood/affect is depressed and sad. Pt denies SI/HI. Pt endorses AVH. No acute distressed noted at this time.   A: CBG performed. Emotional support given and will continue to monitor pt's progress for stabilization.  R: Patient remains safe.

## 2014-12-23 NOTE — [facility] Group Notes (Signed)
[facility] LCSW Group Therapy  12/23/2014  1:05 PM  Type of Therapy:  Group therapy  Participation Level:  Active  Participation Quality:  Attentive  Affect:  Flat  Cognitive:  Oriented  Insight:  Limited  Engagement in Therapy:  Limited  Modes of Intervention:  Discussion, Socialization  Summary of Progress/Problems:  Chaplain was here to lead a group on themes of hope and courage.  "I hope to get better."  Explained he does a lot of drawing "to relieve what is going on in my head."  Talked about his struggles since having a stroke last August.  "I find hope through my Lord and [employer]."  Talked about NA and how he learned from them that sometimes all you need is a toehold, which is what he has here.  Daryel Geraldorth, Meghen Akopyan B 12/23/2014 1:23 PM
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
## 2014-12-23 NOTE — [facility] Group Notes (Signed)
[facility]   12/23/2014 12:01 PM  Participation Quality:  Engaged  Mood/Affect:  Appropriate  Depression Rating:  4  Anxiety Rating:  0  Thoughts of Suicide:  No Will you contract for safety?   NA  Current AVH:  Yes  Plan for Discharge/Comments:  Much better mood today.  Focused on getting stronger and getting into rehab, or someplace else to live.  Open to ALF referral if unable to get into [facility].    Transportation Means: bus  Supports: none  [place], [place] B

## 2014-12-23 NOTE — Plan of Care (Signed)
Problem: Ineffective individual coping Goal: STG: Patient will remain free from self harm Outcome: Progressing Pt denies SI/HI and is free from self harm.

## 2014-12-23 NOTE — Progress Notes (Signed)
D: Pt presents with flat affect and depressed mood. Pt stated that he continues to feel depressed. Pt reported hearing voices telling him to hurt people. Pt stated that he do not want to harm anyone and has been ignoring the voices. Pt reported seeing things but did not specify. Pt has been observed ambulating with walker and also uses wheelchair. Pt gait unsteady d/t weakness from prior strokes. Pt pleasant and cooperative. Pt attends groups and compliant with taking meds.  A: Medications administered as ordered per MD. Verbal support given. Pt encouraged to attend groups. Pt encouraged to report worsening s/s. 15 minute checks performed for safety. R: Pt receptive to treatment.

## 2014-12-23 NOTE — Progress Notes (Signed)
Patient ID: Justin KeensLarry B Delacruz, male   DOB: 07/11/1956, 59 y.o.   MRN: [id number] D: Client slept most of the shift reports "I sleep to get voices out of my head" "I hear voices and see things, but I wouldn't do anything to hurt myself, I'd tell on of one of y'all first" "I like groups" "like the 1:1 with the psychiatrist" A: Writer introduced self to client, reviewed medications administered as ordered. Staff will monitor q2315min for safety. R: client is safe on the unit, attended group.

## 2014-12-24 DIAGNOSIS — F14951 Cocaine use, unspecified with cocaine-induced psychotic disorder with hallucinations: Secondary | ICD-10-CM

## 2014-12-24 DIAGNOSIS — F4310 Post-traumatic stress disorder, unspecified: Principal | ICD-10-CM

## 2014-12-24 LAB — GLUCOSE, CAPILLARY
GLUCOSE-CAPILLARY: 270 mg/dL — AB (ref 70–99)
GLUCOSE-CAPILLARY: 356 mg/dL — AB (ref 70–99)
Glucose-Capillary: 176 mg/dL — ABNORMAL HIGH (ref 70–99)
Glucose-Capillary: 411 mg/dL — ABNORMAL HIGH (ref 70–99)
Glucose-Capillary: 429 mg/dL — ABNORMAL HIGH (ref 70–99)
Glucose-Capillary: 275 mg/dL — ABNORMAL HIGH (ref 70–99)

## 2014-12-24 LAB — HEMOGLOBIN A1C
Hgb A1c MFr Bld: 13.5 % — ABNORMAL HIGH (ref 4.8–5.6)
MEAN PLASMA GLUCOSE: 341 mg/dL

## 2014-12-24 NOTE — [facility] Group Notes (Signed)
[facility] Group Notes:  (Clinical Social Work)  12/24/2014  11:15-12:00PM  Summary of Progress/Problems:   The main focus of today's process group was to discuss patients' feelings about hospitalization, the stigma attached to mental health, and sources of motivation to stay well.  We then worked to identify a specific plan to avoid future hospitalizations when discharged from the hospital for this admission.  The patient expressed that in order to avoid future hospitalizations, he needs to stay focused on keeping well by having a doctor he can talk to.  CSW explained the different roles of psychiatrist versus therapist, and he agreed he would be willing to go to counseling in an individual or group setting.  Type of Therapy:  Group Therapy - Process  Participation Level:  Active  Participation Quality:  Attentive  Affect:  Blunted  Cognitive:  Alert  Insight:  Developing/Improving  Engagement in Therapy:  Developing/Improving  Modes of Intervention:  Exploration, Discussion  Justin MantleMareida Grossman-Orr, LCSW 12/24/2014, 12:37 PM

## 2014-12-24 NOTE — Progress Notes (Signed)
Adult Psychoeducational Group Note  Date:  12/24/2014 Time:  8:53 PM  Group Topic/Focus:  Wrap-Up Group:   The focus of this group is to help patients review their daily goal of treatment and discuss progress on daily workbooks.  Participation Level:  Active  Participation Quality:  Appropriate  Affect:  Appropriate  Cognitive:  Appropriate  Insight: Appropriate  Engagement in Group:  Engaged  Modes of Intervention:  Discussion  Additional Comments: The patient expressed that group was about enjoying the weather.The patient also said that he enjoyed today.  Octavio Mannshigpen, Adamae Ricklefs Lee 12/24/2014, 8:53 PM

## 2014-12-24 NOTE — Progress Notes (Signed)
Writer has observed the patient up in the dayroom watching tv with minimal interaction with peers. Report was passed on to recheck blood sugar in 1 hour d/t elevated blood sugar at shift change. Patient's blood sugar was 356 when rechecked and Dr. Jefferey PicaSaranja was notified of results and no additional order was given. Patient reported feeling weak at medication window and got his wheelchair and returned to take his meds. Patient had been ambulating without his wheelchair on the unit this evening. Patient was encouraged to use wheelchair in order not to tire himself out so quickly. Support and encouragement given, safety maintained on unit with 15 min checks.

## 2014-12-24 NOTE — Progress Notes (Addendum)
D Earnest has had a " so so " day today. HE is quite pleasant,cooperative and easy to get along with .    A HE slept until after 0930, then agreed to get up and take all of his meds, including his insulin. HE answered the questions listed on his daily assessment and stated he was not experiencing any SI within the past 24 hrs and he rated his depression, hopelessness and anxiety " 8/8/8", respectively.   R He attended a group this afternoon and he uses his wheelchair, to walk behind and to steady himself. HE remains a high fall risk and has not experienced any falls thus far today. His cbg was 411  At 1700 and per MD order, he received 15 units novolog sq insulin and will recheck in 1 hr. Pt education reinforced about need to follow modified carbohydrate diet. Pt admits he drank large cup of coffee and added large amounts of sugar to it, right before dinner. POC cont..[name]
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
## 2014-12-24 NOTE — Progress Notes (Signed)
1 hr CBG recheck at 1900 was 429. Dr. Arley PhenixSerrango called and vo received to check cbg in another hour and call results. Infop passed onto oncoming shift. Pt educated and stable

## 2014-12-25 LAB — GLUCOSE, CAPILLARY: GLUCOSE-CAPILLARY: 230 mg/dL — AB (ref 70–99)

## 2014-12-25 MED ORDER — IBUPROFEN 600 MG PO TABS
600.0000 mg | ORAL_TABLET | Freq: Four times a day (QID) | ORAL | Status: DC | PRN
  Administered 2014-12-25 – 2014-12-26 (×2): 600 mg via ORAL
  Filled 2014-12-25 (×2): qty 1
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
## 2014-12-25 NOTE — Plan of Care (Signed)
Problem: Alteration in mood & ability to function due to Goal: STG-Patient will attend groups Outcome: Progressing Patient attended group this evening on 12/24/2014.

## 2014-12-25 NOTE — Plan of Care (Signed)
Problem: Alteration in mood & ability to function due to Goal: LTG-Pt reports reduction in suicidal thoughts (Patient reports reduction in suicidal thoughts and is able to verbalize a safety plan for whenever patient is feeling suicidal)  Outcome: Progressing Patient denies suicidal ideations and reports he will seek out staff if having suicidal thoughts.

## 2014-12-25 NOTE — Progress Notes (Signed)
Adult Psychoeducational Group Note  Date:  12/25/2014 Time:  8:37 PM  Group Topic/Focus:  Wrap-Up Group:   The focus of this group is to help patients review their daily goal of treatment and discuss progress on daily workbooks.  Participation Level:  Active  Participation Quality:  Appropriate  Affect:  Appropriate  Cognitive:  Appropriate  Insight: Appropriate  Engagement in Group:  Engaged  Modes of Intervention:  Discussion  Additional Comments:  The patient expressed that his healthy support system is his family.The patient also said that he had a good day.   Octavio Mannshigpen, Leotha Westermeyer Lee 12/25/2014, 8:37 PM

## 2014-12-25 NOTE — [facility] Group Notes (Signed)
[facility] Group Notes:  (Clinical Social Work)  12/25/2014   11:15am-12:00pm  Summary of Progress/Problems:  The main focus of today's process group was to listen to a variety of genres of music and to identify that different types of music provoke different responses.  The patient then was able to identify personally what was soothing for them, as well as energizing.  Handouts were used to record feelings evoked, as well as how patient can personally use this knowledge in sleep habits, with depression, and with other symptoms.  The patient expressed understanding of concepts, as well as knowledge of how each type of music affected him/her and how this can be used at home as a wellness/recovery tool.  He sang a lot, and was very relaxed at the end of group, stating it had helped him.  Type of Therapy:  Music Therapy   Participation Level:  Active  Participation Quality:  Attentive and Sharing  Affect:  Blunted  Cognitive:  Oriented  Insight:  Engaged  Engagement in Therapy:  Engaged  Modes of Intervention:   Activity, Exploration  Ambrose MantleMareida Grossman-Orr, LCSW 12/25/2014, 12:30pm

## 2014-12-25 NOTE — Progress Notes (Addendum)
[name] was slow to get up and take his morning meds, just like yesterday. He is quiet , does not initiate conversation but is pleasant and cooperative when he is approached by this staff member. He deneid SI within the past 24 hrs and he rates his depression, hopelessness and anxiety " 7/7/5", respectively.   A. He is encouraged by this nurse, at snack time to eat a low carb snack, but chooses to eat strawberry ice cream and  Shortbread cookies instead. He has spent the afternoon coloring and says this is a relaxing thing for him to dol. HE says the " voices" are much better today and that coloring helps him deal with them.  HE is reminded that he remains a high fall risk ( due to his left sided weakness from previous CVA) and he says he holds onto railing in the hallways and  He takes caution when he transfers himself from bed to wheelchair. His cbg was 407 at dinnertime and per MD he was given 15 units novolog insulin sq abd recheck cbg 1 hr later was 396.    R Safety in place and longterm rehab for dc is encouraged.

## 2014-12-26 DIAGNOSIS — F1420 Cocaine dependence, uncomplicated: Secondary | ICD-10-CM

## 2014-12-26 LAB — GLUCOSE, CAPILLARY
GLUCOSE-CAPILLARY: 201 mg/dL — AB (ref 70–99)
GLUCOSE-CAPILLARY: 407 mg/dL — AB (ref 70–99)
GLUCOSE-CAPILLARY: 147 mg/dL — AB (ref 70–99)
Glucose-Capillary: 396 mg/dL — ABNORMAL HIGH (ref 70–99)
Glucose-Capillary: 318 mg/dL — ABNORMAL HIGH (ref 70–99)
Glucose-Capillary: 246 mg/dL — ABNORMAL HIGH (ref 70–99)
Glucose-Capillary: 302 mg/dL — ABNORMAL HIGH (ref 70–99)
Glucose-Capillary: 317 mg/dL — ABNORMAL HIGH (ref 70–99)

## 2014-12-26 MED ORDER — SERTRALINE HCL 50 MG PO TABS
50.0000 mg | ORAL_TABLET | Freq: Every day | ORAL | Status: DC
  Administered 2014-12-27 – 2014-12-28 (×2): 50 mg via ORAL
  Filled 2014-12-26 (×3): qty 1

## 2014-12-26 MED ORDER — TUBERCULIN PPD 5 UNIT/0.1ML ID SOLN: 5.0000 [IU] | Freq: Once | INTRADERMAL | Status: DC

## 2014-12-26 MED ORDER — LIVING WELL WITH DIABETES BOOK
Freq: Once | Status: AC
  Administered 2014-12-26: 11:00:00
  Filled 2014-12-26: qty 1

## 2014-12-26 NOTE — Progress Notes (Signed)
Writer has observed the patient up in the dayroom interacting with peers more this evening. He reports having had a good day and has been in the dayroom drawing and coloring. He reports that this helps with his auditory hallucinations. Patient [place] to come there and live once discharged. Patient is compliant with medications prescribed and attended group. Support and encouragement given, safety maintained on unit with 15 min checks.

## 2014-12-26 NOTE — Progress Notes (Signed)
D:Patient in the dayroom drawing on approach.  Patient states he had a good day.  Patient states his goal is to get better.  Patient states he is getting better slowly day by day.  Patient states his plan is to go live with his daughter when discharged or to go live in an [place]. Patient denies SI/HI but still states he is having auditory hallucinations.  Patient verbally contracts for safety.  A: Staff to monitor Q 15 mins for safety.  Encouragement and support offered.  Scheduled medications administered per orders. R: Patient remains safe on the unit.  Patient attended group tonight.  Patient visible on the unit and interacting with peers.  Patient taking administered medications.

## 2014-12-26 NOTE — [facility] Group Notes (Signed)
[facility] Note   12/26/2014 2:33 PM  Participation Quality:  Engaged  Mood/Affect:  Flat  Depression Rating:  5  Anxiety Rating:  5  Thoughts of Suicide:  No Will you contract for safety?   NA  Current AVH:  No  Plan for Discharge/Comments:  Shared tghat he had two conversations this weekend that meant a lot to him.  First[place] [facility] [place] [place] to [place] him with the [place] needed to help him engage with services there.  Also, his daughter called to say she wants him tos tay with her.  He is struggling with that because he does not feel like he deserves this care, so will let her know later today if he wants to take her up on her kind offer. Looks and sounds much better than last week. Observed playing cards with others in day room.  Transportation Means: daughter  Supports: daughter  Ida Rogueorth, Arisbeth Purrington B

## 2014-12-26 NOTE — Evaluation (Signed)
Physical Therapy Evaluation Patient Details Name: [name] MRN: [id number] DOB: 12/30/1955 Today's Date: 12/26/2014   History of Present Illness  59 yo male admitted with PTSD, auditory hallucinations. hx of DM, HTN, CVA with residual L sided weakness, cocaine induced psychotic d/o, schizophrenia, MI.   Clinical Impression  On eval, pt was supervision level assist for ambulation with RW ~75 feet; Min guard assist for ambulation without device. Pt normally uses cane at baseline and will likely be fine to return to cane use at discharge. Left rehab RW (with green tape) in room for pt to use [facility] which was not very steady for pt to use. Encouraged pt to remain as mobile as possible during stay with continued use of walker since he [facility]. Pt states he plans to d/c to daughter's home. Will need to retrieve cane from his home so he can have it to use at daughter's home.     Follow Up Recommendations No PT follow up;Supervision - Intermittent    Equipment Recommendations  None recommended by PT (pt has cane at home)    Recommendations for Other Services       Precautions / Restrictions Precautions Precautions: Fall Restrictions Weight Bearing Restrictions: No      Mobility  Bed Mobility               General bed mobility comments: sitting EOB after initially observed pt walking back to room while holding onto books in one hand.  Transfers Overall transfer level: Needs assistance   Transfers: Sit to/from Stand Sit to Stand: Supervision         General transfer comment: VCS hand placement. Increased time.   Ambulation/Gait Ambulation/Gait assistance: Min guard;Supervision Ambulation Distance (Feet): 75 Feet (75'x1 with rw, 20' x1 without support) Assistive device: Rolling walker (2 wheeled);None Gait Pattern/deviations: Step-through pattern;Trunk flexed;Decreased stride length     General Gait Details:  Ambulated x1 with walker PT brought over ([facility] very steady)-supervision level assist. Min guard assist for ambulation without device but with pt furniture walking for support.  Stairs            Wheelchair Mobility    Modified Rankin (Stroke Patients Only)       Balance Overall balance assessment: Needs assistance         Standing balance support: Single extremity supported;During functional activity Standing balance-Leahy Scale: Fair Standing balance comment: Pt needs at least 1 point of support for stable ambulation.                              Pertinent Vitals/Pain Pain Assessment: No/denies pain    Home Living Family/patient expects to be discharged to:: Private residence (daughter's home per pt) Living Arrangements: Spouse/significant other (pt states he is not planning to return to home with wife) Available Help at Discharge: Family           Home Equipment: Gilmer Morane - single point      Prior Function Level of Independence: Independent with assistive device(s)         Comments: MOD Ind with cane     Hand Dominance        Extremity/Trunk Assessment   Upper Extremity Assessment: LUE deficits/detail       LUE Deficits / Details: LE strength at least 3/5 throughout. Poor effort from pt when performing MMT  Lower Extremity Assessment: LLE deficits/detail   LLE Deficits / Details: Strength at least 3/5. Poor effort from pt when performing MMT  Cervical / Trunk Assessment: Kyphotic  Communication   Communication: No difficulties  Cognition Arousal/Alertness: Awake/alert Behavior During Therapy: WFL for tasks assessed/performed Overall Cognitive Status: Within Functional Limits for tasks assessed                      General Comments      Exercises        Assessment/Plan    PT Assessment Patent does not need any further PT services  PT Diagnosis Difficulty walking   PT Problem List    PT  Treatment Interventions     PT Goals (Current goals can be found in the Care Plan section) Acute Rehab PT Goals Patient Stated Goal: home soon PT Goal Formulation: All assessment and education complete, DC therapy    Frequency     Barriers to discharge        Co-evaluation               End of Session Equipment Utilized During Treatment: Gait belt Activity Tolerance: Patient tolerated treatment well Patient left: in bed (sitting at EOB)      Functional Assessment Tool Used: clinical judgement Functional Limitation: Mobility: Walking and moving around Mobility: Walking and Moving Around Current Status (W1191): At least 1 percent but less than 20 percent impaired, limited or restricted Mobility: Walking and Moving Around Goal Status [phone]): At least 1 percent but less than 20 percent impaired, limited or restricted    Time: 1411-1427 PT Time Calculation (min) (ACUTE ONLY): 16 min   Charges:   PT Evaluation $Initial PT Evaluation Tier I: 1 Procedure     PT G Codes:   PT G-Codes **NOT FOR INPATIENT CLASS** Functional Assessment Tool Used: clinical judgement Functional Limitation: Mobility: Walking and moving around Mobility: Walking and Moving Around Current Status (F6213): At least 1 percent but less than 20 percent impaired, limited or restricted Mobility: Walking and Moving Around Goal Status [phone]): At least 1 percent but less than 20 percent impaired, limited or restricted    Rebeca Alert, MPT Pager: [phone]

## 2014-12-26 NOTE — Progress Notes (Signed)
Inpatient Diabetes Program Recommendations  AACE/ADA: New Consensus Statement on Inpatient Glycemic Control (2013)  Target Ranges:  Prepandial:   less than 140 mg/dL      Peak postprandial:   less than 180 mg/dL (1-2 hours)      Critically ill patients:  140 - 180 mg/dL   Reason for Assessment: Diabetes Consult  Diabetes history: DM2 Outpatient Diabetes medications: 70/30 40 units bid Current orders for Inpatient glycemic control: 70/30 44 units bid, Novolog moderate tidwc and hs  59 year old [facility] with worsening psychosis, SI/HI. Hx CVA with residual weakness from stroke. In wheelchair. Hx DM2 on 70/30 insulin at home. Blood sugars > 400 mg/dL yesterday afternoon and evening. According to medical record, pt drinking coffee with multiple packs of sugar, eating strawberry ice cream and shortcake cookies despite RN's encouragement to eat healthy low CHO foods. Blood sugar normalized this am.  Results for Coralie KeensSTACEY, Avrey B (MRN [id number]) as of 12/26/2014 10:30  Ref. Range 12/24/2014 17:32 12/24/2014 18:56 12/24/2014 20:05 12/24/2014 21:35 12/25/2014 06:02 12/26/2014 06:21  Glucose-Capillary Latest Range: 70-99 mg/dL 409411 (H) 811429 (H) 914356 (H) 275 (H) 230 (H) 147 (H)   Results for Coralie KeensSTACEY, Alandis B (MRN [id number]) as of 12/26/2014 10:30  Ref. Range 12/23/2014 06:34  Hemoglobin A1C Latest Range: 4.8-5.6 % 13.5 (H)    HgbA1C indicates poor control prior to admission.  Agree with present orders. If CBGs > 180 mg/dL - increase Novolog to resistant tidwc and hs. RN and staff please continue to encourage pt to make healthy choices in cafeteria and snack times. Limit portion sizes and choose low CHO snacks between meals. Will order Living Well With Diabetes book for pt to review. Needs PCP to manage DM when discharged.    Discussed above with RN. Will follow. Thank you. Ailene Ardshonda Anasha Perfecto, RD, LDN, CDE Inpatient Diabetes Coordinator [phone]
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
## 2014-12-26 NOTE — Plan of Care (Signed)
Problem: Food- and Nutrition-Related Knowledge Deficit (NB-1.1) Goal: Nutrition education Formal process to instruct or train a patient/client in a skill or to impart knowledge to help patients/clients voluntarily manage or modify food choices and eating behavior to maintain or improve health.  Outcome: Completed/Met Date Met:  12/26/14  RD consulted for nutrition education regarding diabetes and heart healthy diet.    Lab Results  Component Value Date    HGBA1C 13.5* 12/23/2014    RD provided "Carbohydrate Counting for People with Diabetes" handout from the Academy of Nutrition and Dietetics and "Plate Method" visual handout. Discussed different food groups and their effects on blood sugar and heart health, emphasizing carbohydrate-containing foods. Provided list of carbohydrates and recommended serving sizes of common foods.  Discussed importance of controlled and consistent carbohydrate intake throughout the day.Provided examples on ways to decrease sodium and fat intake in diet. Discouraged intake of processed foods and use of salt shaker.  Provided examples of ways to balance meals/snacks and encouraged intake of high-fiber, whole grain complex carbohydrates. Teach back method used.  Expect good compliance with family support.  Body mass index is 25.84 kg/(m^2). Pt meets criteria for overweight based on current BMI.  Diet Order: Diet Carb Modified Pt is also offered choice of unit snacks mid-morning and mid-afternoon.  Pt is eating as desired.   Labs and medications reviewed. No further nutrition interventions warranted at this time. If additional nutrition issues arise, please re-consult RD.  Clayton Bibles, MS, RD, LDN Pager: [phone] After Hours Pager: [phone]

## 2014-12-26 NOTE — Plan of Care (Signed)
Problem: Alteration in mood & ability to function due to Goal: STG-Patient will comply with prescribed medication regimen (Patient will comply with prescribed medication regimen)  Outcome: Progressing Patient was compliant with prescribed medications on 12/25/2014

## 2014-12-26 NOTE — Progress Notes (Signed)
The focus of this group is to help patients review their daily goal of treatment and discuss progress on daily workbooks. Pt attended the evening group session and responded to all discussion prompts from the Writer. Pt shared that today was a good day on the hall, the highlight of which was learning that upon discharge he had decided to live with his daughter and her family rather than an assisted living facility. Pt shared he was hopeful about this situation working out. Pt's only additional request from Nursing Staff this evening was to shave, which was taken care of following group. Pt's affect was appropriate. 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

## 2014-12-26 NOTE — [facility] Group Notes (Signed)
[facility] LCSW Group Therapy  12/26/2014 1:15 pm  Type of Therapy: Process Group Therapy  Participation Level:  Active  Participation Quality:  Appropriate  Affect:  Flat  Cognitive:  Oriented  Insight:  Improving  Engagement in Group:  Limited  Engagement in Therapy:  Limited  Modes of Intervention:  Activity, Clarification, Education, Problem-solving and Support  Summary of Progress/Problems: Today's group addressed the issue of overcoming obstacles.  Patients were asked to identify their biggest obstacle post d/c that stands in the way of their on-going success, and then problem solve as to how to manage this.  Justin NajjarLarry spent a lot of time explaining how he ended up in prison and what prison life was like.  He sees it as an obstacle he overcame, though he admitted to still struggling.  He told a fascinating story that intrigued other group members.  [name], [name] 12/26/2014   2:42 PM

## 2014-12-26 NOTE — Progress Notes (Addendum)
D: Pt denies SI/HI but pt is still having AH. Pt is pleasant and cooperative. Pt rates depression at a 5, anxiety at a 5, and Helplessness/hopelessness at a 7.  A: Pt was offered support and encouragement. Pt was given scheduled medications. Pt was encourage to attend groups. Q 15 minute checks were done for safety. Pt was also given a PPD.  R:Pt attends groups and interacts well with peers and staff. Pt taking medication. Pt has no complaints.Pt receptive to treatment and safety maintained on unit.

## 2014-12-27 LAB — GLUCOSE, CAPILLARY
GLUCOSE-CAPILLARY: 228 mg/dL — AB (ref 70–99)
GLUCOSE-CAPILLARY: 234 mg/dL — AB (ref 70–99)
Glucose-Capillary: 247 mg/dL — ABNORMAL HIGH (ref 70–99)
Glucose-Capillary: 239 mg/dL — ABNORMAL HIGH (ref 70–99)

## 2014-12-27 MED ORDER — INSULIN ASPART 100 UNIT/ML ~~LOC~~ SOLN
0.0000 [IU] | Freq: Every day | SUBCUTANEOUS | Status: DC
  Administered 2014-12-27: 2 [IU] via SUBCUTANEOUS

## 2014-12-27 MED ORDER — INSULIN ASPART 100 UNIT/ML ~~LOC~~ SOLN
0.0000 [IU] | Freq: Three times a day (TID) | SUBCUTANEOUS | Status: DC
  Administered 2014-12-27 – 2014-12-28 (×2): 7 [IU] via SUBCUTANEOUS
  Administered 2014-12-28: 4 [IU] via SUBCUTANEOUS
  Administered 2014-12-28: 11 [IU] via SUBCUTANEOUS
  Administered 2014-12-29: 3 [IU] via SUBCUTANEOUS

## 2014-12-27 NOTE — Progress Notes (Signed)
Inpatient Diabetes Program Recommendations  AACE/ADA: New Consensus Statement on Inpatient Glycemic Control (2013)  Target Ranges:  Prepandial:   less than 140 mg/dL      Peak postprandial:   less than 180 mg/dL (1-2 hours)      Critically ill patients:  140 - 180 mg/dL    Results for Justin KeensSTACEY, [name] (MRN [id number]) as of 12/27/2014 17:14  Ref. Range 12/26/2014 16:48 12/26/2014 20:40 12/27/2014 05:57 12/27/2014 11:30 12/27/2014 16:48  Glucose-Capillary Latest Range: 70-99 mg/dL 409302 (H) 811317 (H) 914247 (H) 228 (H) 234 (H)    Continues with hyperglycemia.  Increase 70/30 to 46 units bid. Reasonable to add metformin 500 bid - but needs to f/u with PCP to manage DM.  Will continue to follow. Thank you. Ailene Ardshonda Mung Rinker, RD, LDN, CDE Inpatient Diabetes Coordinator [phone]

## 2014-12-27 NOTE — [facility] Group Notes (Signed)
[facility] Group Notes:  (Nursing/MHT/Case Management/Adjunct)  Date:  12/27/2014  Time:  10:45 AM  Type of Therapy:  Nurse Education  Participation Level:  Active  Participation Quality:  Appropriate, Attentive and Sharing  Affect:  Appropriate and Depressed  Cognitive:  Alert, Appropriate and Oriented  Insight:  Appropriate  Engagement in Group:  Engaged  Modes of Intervention:  Discussion, Education and Support  Summary of Progress/Problems: Pt shared a little about his background. He said his goal was to attend groups (and "to live life better).  Maurine SimmeringShugart, Lelania Bia M 12/27/2014, 10:45 AM

## 2014-12-27 NOTE — Tx Team (Signed)
  Interdisciplinary Treatment Plan Update   Date Reviewed:  12/27/2014  Time Reviewed:  8:37 AM  Progress in Treatment:   Attending groups: Yes Participating in groups: Yes Taking medication as prescribed: Yes  Tolerating medication: Yes Family/Significant other contact made: Yes  Patient understands diagnosis: Yes  Discussing patient identified problems/goals with staff: Yes  See initial care plan Medical problems stabilized or resolved: Yes Denies suicidal/homicidal ideation: Yes  In tx team Patient has not harmed self or others: Yes  For review of initial/current patient goals, please see plan of care.  Estimated Length of Stay:  D/C Thursday  Reason for Continuation of Hospitalization:  [name] is responding well to medication.  Denies SI/HI.  Voices are lessening.  Depression 4  Anxiety 4   New Problems/Goals identified:  N/A  Discharge Plan or Barriers:   states he will live with daughter in [place], follow up with mental health  Additional Comments:  Attendees:  Signature: Ivin BootySarama Eappen, MD 12/27/2014 8:37 AM   Signature: Richelle Itood Dionisio Aragones, LCSW 12/27/2014 8:37 AM  Signature: Fransisca KaufmannLaura Davis, NP 12/27/2014 8:37 AM  Signature:  12/27/2014 8:37 AM  Signature: Melissa NoonKaren Shugart, RN  12/27/2014 8:37 AM  Signature:  12/27/2014 8:37 AM  Signature:   12/27/2014 8:37 AM  Signature:    Signature:    Signature:    Signature:    Signature:    Signature:      Scribe for Treatment Team:   Richelle Itood Laterria [place]ota, LCSW  12/27/2014 8:37 AM

## 2014-12-27 NOTE — Progress Notes (Signed)
D: Mr. [name] denies SI and AH, but endorses some auditory hallucinations -- voices are telling he's "no good." He contracts for safety. He shared his desire to go back to prison because he feels uncomfortable on the outside. He is soft-spoken, polite, and thoughtful.   A: Medications given as ordered. Encouragement/support provided. Q15 safety checks maintained.   R: Pt remains safe and continues with treatment. Will continue to monitor for needs/safety.

## 2014-12-27 NOTE — [facility] Suicide Risk Assessment (Signed)
[facility] INPATIENT:  Family/Significant Other Suicide Prevention Education  Suicide Prevention Education:  Education Completed; Marlow Baarsatasha Clark, daughter, [phone]  has been identified by the patient as the family member/significant other with whom the patient will be residing, and identified as the person(s) who will aid the patient in the event of a mental health crisis (suicidal ideations/suicide attempt).  With written consent from the patient, the family member/significant other has been provided the following suicide prevention education, prior to the and/or following the discharge of the patient.  The suicide prevention education provided includes the following:  Suicide risk factors  Suicide prevention and interventions  National Suicide Hotline telephone number  [facility] assessment telephone number  [facility] Emergency Assistance 911  [facility] and/or [facility]idential Mobile Crisis Unit telephone number  Request made of family/significant other to:  Remove weapons (e.g., guns, rifles, knives), all items previously/currently identified as safety concern.    Remove drugs/medications (over-the-counter, prescriptions, illicit drugs), all items previously/currently identified as a safety concern.  The family member/significant other verbalizes understanding of the suicide prevention education information provided.  The family member/significant other agrees to remove the items of safety concern listed above.  Daryel Geraldorth, Hessie Varone B 12/27/2014, 2:46 PM

## 2014-12-27 NOTE — Progress Notes (Signed)
Patient has not slept well tonight due to his roommate snoring.

## 2014-12-27 NOTE — Progress Notes (Signed)
D: Patient in the hallway on approach.  Patient states he had a good day.  Patient states he attended groups and he was able to draw.  Patient states he is supposed to discharge Thursday. Patient states his goal for today was to finalize his place of residence and to stay on his medications. Patient denies SI/HI but states he is still having auditory hallucinations.   A: Staff to monitor Q 15 mins for safety.  Encouragement and support offered.  Scheduled medications administered per orders. R: Patient remains safe on the unit.  Patient attended group tonight.  Patient visible on the unit and interacting with peers.  Patient taking administered medications.

## 2014-12-27 NOTE — [facility] Group Notes (Signed)
[facility] LCSW Group Therapy  12/27/2014 , 3:02 PM   Type of Therapy:  Group Therapy  Participation Level:  Active  Participation Quality:  Attentive  Affect:  Appropriate  Cognitive:  Alert  Insight:  Improving  Engagement in Therapy:  Engaged  Modes of Intervention:  Discussion, Exploration and Socialization  Summary of Progress/Problems: Today's group focused on the term Diagnosis.  Participants were asked to define the term, and then pronounce whether it is a negative, positive or neutral term.  [name] was called out of group to meet with the Dr.  While in group, he talked about the concept of  mutual aid and how experienced that in the community in which he grew up.  While lamenting the fact that it does not happen any more, he was willing to concede that there are small examples of that happening in the hospital, for which he is thankful.  Daryel Geraldorth, Mikah Poss B 12/27/2014 , 3:02 PM

## 2014-12-27 NOTE — Progress Notes (Signed)
The focus of this group is to help patients review their daily goal of treatment and discuss progress on daily workbooks. [name] attended the evening group session and responded to all discussion prompts from the Writer. [name] shared that today was a good day, the highlight of which being able to draw in the dayroom and the comradery of his peers. "I just like seeing people get along like this." [name] shared that he may discharge on Thursday, which he felt good about. [name] had no additional requests from Nursing Staff this evening. [name]'s affect was appropriate.
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
## 2014-12-28 LAB — GLUCOSE, CAPILLARY
GLUCOSE-CAPILLARY: 163 mg/dL — AB (ref 70–99)
GLUCOSE-CAPILLARY: 226 mg/dL — AB (ref 70–99)
Glucose-Capillary: 284 mg/dL — ABNORMAL HIGH (ref 70–99)
Glucose-Capillary: 155 mg/dL — ABNORMAL HIGH (ref 70–99)

## 2014-12-28 MED ORDER — METFORMIN HCL 500 MG PO TABS
500.0000 mg | ORAL_TABLET | Freq: Two times a day (BID) | ORAL | Status: DC
  Administered 2014-12-28 – 2014-12-29 (×2): 500 mg via ORAL
  Filled 2014-12-28 (×2): qty 1
  Filled 2014-12-28 (×3): qty 8
  Filled 2014-12-28 (×2): qty 1
  Filled 2014-12-28: qty 8

## 2014-12-28 MED ORDER — SERTRALINE HCL 25 MG PO TABS
75.0000 mg | ORAL_TABLET | Freq: Every day | ORAL | Status: DC
  Administered 2014-12-29: 75 mg via ORAL
  Filled 2014-12-28: qty 3
  Filled 2014-12-28 (×2): qty 12
  Filled 2014-12-28: qty 3

## 2014-12-28 MED ORDER — INSULIN ASPART PROT & ASPART (70-30 MIX) 100 UNIT/ML ~~LOC~~ SUSP
46.0000 [IU] | Freq: Two times a day (BID) | SUBCUTANEOUS | Status: DC
Start: 2014-12-28 — End: 2014-12-29
  Administered 2014-12-28 – 2014-12-29 (×2): 46 [IU] via SUBCUTANEOUS

## 2014-12-28 NOTE — Progress Notes (Signed)
Patient ID: Justin KeensLarry B Buchinger, male   DOB: 12/03/1955, 59 y.o.   MRN: [id number]   Pt currently presents with a flat affect and pleasant behavior. Per self inventory, pt rates depression, hopelessness and anxiety at a 1. Pt daily goal is to "stay on meds/focus on going home tomorrow." and they intend to do so by "staying with the group and listening to what other have to say and focusing on hearing what applies to me." Pt reports good sleep, concentration and appetite.   Pt provided with medications per providers orders. Pt's labs and vitals were monitored throughout the day. Pt supported emotionally and encouraged to express concerns and questions. Pt educated on positive support and medications.  Pt's safety ensured with 15 minute and environmental checks. Pt currently denies SI/HI and A/V hallucinations. Pt verbally agrees to seek staff if SI/HI or A/VH occurs and to consult with staff before acting on these thoughts. Pt has been present in the milieu throughout the day, he has been drawing a cowgirl scene for another pts daughter. Pt engages with staff and jokes with them appropriately. Pt plan to take his medications when he lives with his daughter post discharge.

## 2014-12-28 NOTE — Progress Notes (Signed)
  [facility] :  Will you be returning to the same living situation after discharge:  No. At discharge, do you have transportation home?: Yes,  daughter Do you have the ability to pay for your medications: Yes,  MCD  Release of information consent forms completed and in the chart;  Patient's signature needed at discharge.  Patient to Follow up at: Follow-up Information    [facility] [facility].   Why:  Tuesday at 10:30 with Dr Salvadore FarberAkhtar   Contact information:   [address]  [address]  [place]  [phone]] [phone]      Patient denies SI/HI: Yes,  yes    Safety Planning and Suicide Prevention discussed: Yes,  yes     Has patient been referred to the Quitline?: N/A patient is not a smoker  [name] 12/28/2014, 11:09 AM

## 2014-12-28 NOTE — [facility] Group Notes (Signed)
[facility]  12/28/2014 , 1:27 PM    Type of Therapy:  [facility] Presentation  Participation Level:  Active  Participation Quality:  Attentive  Affect:  Blunted  Cognitive:  Oriented  Insight:  Limited  Engagement in Therapy:  Engaged  Modes of Intervention:  Discussion, Education and Socialization  Summary of Progress/Problems:  Onalee HuaDavid from [facility] came to present his recovery story and play the guitar.  Sat quietly and listened attentively to the presenter.  Daryel Geraldorth, Masiel Gentzler B 12/28/2014 , 1:27 PM
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
## 2014-12-28 NOTE — Progress Notes (Signed)
D:Patient in the dayroom on approach.  Patient states he had a good day.  Patient states he is supposed to be discharged tomorrow.  Patient states he is going to go live with his daughter when discharged.  Patient states he is going to continue to draw  Patient denies SI/HI and denies AVH. A: Staff to monitor Q 15 mins for safety.  Encouragement and support offered.  Scheduled medications administered per orders. R: Patient remains safe on the unit.  Patient visible on the unit and interacting with peers.  Patient taking administered medications.

## 2014-12-28 NOTE — Plan of Care (Signed)
Problem: Alteration in mood Goal: LTG-Patient reports reduction in suicidal thoughts (Patient reports reduction in suicidal thoughts and is able to verbalize a safety plan for whenever patient is feeling suicidal)  Outcome: Progressing Patient denies SI and verbally contracts for safety.  Patient states he has been drawing as one of his coping skills.

## 2014-12-29 ENCOUNTER — Encounter (HOSPITAL_COMMUNITY): Payer: Self-pay | Admitting: Registered Nurse

## 2014-12-29 LAB — GLUCOSE, CAPILLARY
Glucose-Capillary: 119 mg/dL — ABNORMAL HIGH (ref 70–99)
Glucose-Capillary: 263 mg/dL — ABNORMAL HIGH (ref 70–99)
Glucose-Capillary: 145 mg/dL — ABNORMAL HIGH (ref 70–99)

## 2014-12-29 MED ORDER — FUROSEMIDE 40 MG PO TABS: 40.0000 mg | ORAL_TABLET | Freq: Two times a day (BID) | ORAL | Status: AC

## 2014-12-29 MED ORDER — POLYETHYLENE GLYCOL 3350 17 G PO PACK: 17.0000 g | PACK | Freq: Every day | ORAL | Status: AC | PRN

## 2014-12-29 MED ORDER — ADULT MULTIVITAMIN W/MINERALS CH: 1.0000 | ORAL_TABLET | Freq: Every day | ORAL | Status: AC

## 2014-12-29 MED ORDER — INSULIN NPH ISOPHANE & REGULAR (70-30) 100 UNIT/ML ~~LOC~~ SUSP: 46.0000 [IU] | Freq: Two times a day (BID) | SUBCUTANEOUS | Status: AC

## 2014-12-29 MED ORDER — COLCHICINE 0.6 MG PO TABS: 0.6000 mg | ORAL_TABLET | Freq: Two times a day (BID) | ORAL | Status: AC

## 2014-12-29 MED ORDER — AMLODIPINE BESYLATE 10 MG PO TABS: 10.0000 mg | ORAL_TABLET | Freq: Every day | ORAL | Status: AC

## 2014-12-29 MED ORDER — DSS 100 MG PO CAPS: 100.0000 mg | ORAL_CAPSULE | Freq: Two times a day (BID) | ORAL | Status: AC

## 2014-12-29 MED ORDER — FAMOTIDINE 20 MG PO TABS: 20.0000 mg | ORAL_TABLET | Freq: Two times a day (BID) | ORAL | Status: AC

## 2014-12-29 MED ORDER — INSULIN ASPART PROT & ASPART (70-30 MIX) 100 UNIT/ML ~~LOC~~ SUSP: 46.0000 [IU] | Freq: Two times a day (BID) | SUBCUTANEOUS | Status: DC

## 2014-12-29 MED ORDER — INSULIN ASPART 100 UNIT/ML ~~LOC~~ SOLN: 0.0000 [IU] | Freq: Three times a day (TID) | SUBCUTANEOUS | Status: AC

## 2014-12-29 MED ORDER — SERTRALINE HCL 25 MG PO TABS: 75.0000 mg | ORAL_TABLET | Freq: Every day | ORAL | Status: AC

## 2014-12-29 MED ORDER — TRAZODONE HCL 100 MG PO TABS: 100.0000 mg | ORAL_TABLET | Freq: Every day | ORAL | Status: AC

## 2014-12-29 MED ORDER — METOPROLOL TARTRATE 25 MG PO TABS: 25.0000 mg | ORAL_TABLET | Freq: Every day | ORAL | Status: AC

## 2014-12-29 MED ORDER — METFORMIN HCL 500 MG PO TABS: 500.0000 mg | ORAL_TABLET | Freq: Two times a day (BID) | ORAL | Status: AC

## 2014-12-29 MED ORDER — HYDRALAZINE HCL 50 MG PO TABS: 50.0000 mg | ORAL_TABLET | Freq: Three times a day (TID) | ORAL | Status: AC

## 2014-12-29 MED ORDER — BENZTROPINE MESYLATE 0.5 MG PO TABS: 0.5000 mg | ORAL_TABLET | Freq: Two times a day (BID) | ORAL | Status: AC

## 2014-12-29 MED ORDER — HALOPERIDOL 5 MG PO TABS: ORAL_TABLET | ORAL | Status: AC

## 2014-12-29 NOTE — Progress Notes (Signed)
Patient ID: [name], male   DOB: 04/24/1956, 59 y.o.   MRN: [id number]   Adult Psychoeducational Group Note  Date:  12/29/2014 Time: 09:15  Group Topic/Focus:  Orientation:   The focus of this group is to educate the patient on the purpose and policies of crisis stabilization and provide a format to answer questions about their admission.  The group details unit policies and expectations of patients while admitted.  Participation Level:  Active  Participation Quality:  Appropriate  Affect:  Appropriate  Cognitive:  Appropriate  Insight: Appropriate  Engagement in Group:  Improving  Modes of Intervention:  Discussion, Education and Support  Additional Comments:  Pt able to identify one short and long term goal.   Aurora Maskwyman, Brody Kump E 12/29/2014, 12:32 PM

## 2014-12-29 NOTE — Progress Notes (Signed)
Patient ID: Justin KeensLarry B Delacruz, male   DOB: 03/03/1956, 59 y.o.   MRN: [id number]   Pt discharged home with money for the bus to the depot and for the parts bus. Pt also had cash in an envelope. [place]. [facility] was stable and appreciative. All papers and prescriptions were given and valuables returned. Verbal understanding expressed. Denies SI/HI and A/VH. Pt given opportunity to express concerns and ask questions.

## 2014-12-29 NOTE — Discharge Summary (Signed)
Physician Discharge Summary Note  Patient:  Justin KeensLarry B Delacruz is an 59 y.o., male MRN:  [id number] DOB:  03/14/1956 Patient phone:  [phone] (home)  Patient address:   [address] [place] [address],  Total Time spent with patient: 30 minutes  Date of Admission:  12/22/2014 Date of Discharge: 12/29/2014  Reason for Admission:  Cocaine use  Principal Problem: PTSD (post-traumatic stress disorder) Discharge Diagnoses: Patient Active Problem List   Diagnosis Date Noted  . 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 Cocaine use disorder, severe, dependence [F14.20] 12/22/2014  . PTSD (post-traumatic stress disorder) [F43.10] 12/22/2014  . Protein-calorie malnutrition, severe [E43] 07/28/2014  . Hyponatremia [E87.1] 07/27/2014  . Bacteremia [R78.81] 07/27/2014  . Subarachnoid hemorrhage [I60.9] 06/21/2014  . DM (diabetes mellitus), type 2, uncontrolled [E11.65] 02/13/2013  . Diabetes mellitus [E11.9] 12/08/2011  . Noncompliance [Z91.19] 12/08/2011  . CAD (coronary artery disease) [I25.10] 12/08/2011  . Chest pain [R07.9] 12/08/2011  . Chronic pain [G89.29]   . Gout [M10.9]   . Hypertension [I10]    Musculoskeletal: Strength & Muscle Tone: within normal limits Gait & Station: normal Patient leans: N/A  Psychiatric Specialty Exam:  SEE SRA Physical Exam  Vitals reviewed. Psychiatric: His mood appears anxious.    Review of Systems  Psychiatric/Behavioral: The patient is nervous/anxious.   All other systems reviewed and are negative.   Blood pressure 137/75, pulse 98, temperature 97.8 F (36.6 C), temperature source Oral, resp. rate 20, height 5\' 6"  (1.676 m), weight 72.576 kg (160 lb), SpO2 98 %.Body mass index is 25.84 kg/(m^2).   Past Medical History:  Past Medical History  Diagnosis Date  . Diabetes mellitus   . Hypertension   . Gout   . MI (myocardial infarction)   . Chronic pain    right elbow  . Major depression, chronic   . Bipolar disorder   . Schizophrenia   . Subarachnoid hemorrhage september 2015  . Acute respiratory failure September 2015  . 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 Bacteremia   . Chronic pain   . Stroke     Past Surgical History  Procedure Laterality Date  . Coronary angioplasty with stent placement      2008  . Knee surgery      both  . Tee without cardioversion N/A 07/26/2014    Procedure: TRANSESOPHAGEAL ECHOCARDIOGRAM (TEE);  Surgeon: Lars MassonKatarina H Nelson, MD;  [facility];  Service: Cardiovascular;  Laterality: N/A;   Family History:  Family History  Problem Relation Age of Onset  . Diabetic kidney disease Mother   . Hypertension Mother   . Gout Mother   . Diabetic kidney disease Father   . Heart attack Brother [id number]   Social History:  History  Alcohol Use No    Comment: denies     History  Drug Use  . Yes  . Special: Cocaine    Comment: history of cocaine use, pt states last use was before stroke in September of this year.    History   Social History  . Marital Status: Divorced    Spouse Name: N/A  . Number of Children: N/A  . Years of Education: N/A   Social History Main Topics  . Smoking status: Former Smoker -- .1 years    Types: Cigarettes    Quit date: 01/07/2013  . Smokeless tobacco: Never Used  . Alcohol Use: No  Comment: denies  . Drug Use: Yes    Special: Cocaine     Comment: history of cocaine use, pt states last use was before stroke in September of this year.  . Sexual Activity: No   Other Topics Concern  . None   Social History Narrative  Risk to Self: Is patient at risk for suicide?: No Risk to Others:   Prior Inpatient Therapy:   Prior Outpatient Therapy:    Level of Care:  OP  Hospital Course:   Daymond Cordts, 59 year old wheel chair bound, has a hx of cocaine abuse as well as PTSD and several medical problems including cardiac  history and Diabetes . Patient called police and told then that he was hearing voices and that he will hurt some one or hurt self. Patient was hence brought to [facility]. Patient has had at least 2 previous hospitalizations to [facility] in the past. Patient presents with similar complaints this time.  [name] was admitted for PTSD (post-traumatic stress disorder) and crisis management.  He was treated with Haldol for mood symptoms and Zoloft for depression.  He was  discharged with the medications listed below under Medication List.  Medical problems were identified and treated as needed.  Home medications were restarted as appropriate.  Improvement was monitored by observation and [name] daily report of symptom reduction.  Emotional and mental status was monitored by daily self-inventory reports completed by [name] and clinical staff.         [name] was evaluated by the treatment team for stability and plans for continued recovery upon discharge.  [name] motivation was an integral factor for scheduling further treatment.  Employment, transportation, bed availability, health status, family support, and any pending legal issues were also considered during his hospital stay.  He was offered further treatment options upon discharge including but not limited to Residential, Intensive Outpatient, and Outpatient treatment.  [name] will follow up with the services as listed below under Follow Up Information.     Upon completion of this admission the patient was both mentally and medically stable for discharge denying suicidal/homicidal ideation, auditory/visual/tactile hallucinations, delusional thoughts and paranoia.      Consults:  psychiatry  Significant Diagnostic Studies:  labs: per ED  Discharge Vitals:   Blood pressure 137/75, pulse 98, temperature 97.8 F (36.6 C), temperature source Oral, resp. rate 20, height  (1.676 m), weight 72.576 kg (160 lb), SpO2 98  %. Body mass index is 25.84 kg/(m^2). Lab Results:   Results for orders placed or performed during the hospital encounter of 12/22/14 (from the past 72 hour(s))  Glucose, capillary     Status: Abnormal   Collection Time: 12/26/14  8:40 PM  Result Value Ref Range   Glucose-Capillary 317 (H) 70 - 99 mg/dL   Comment 1 Notify RN   Glucose, capillary     Status: Abnormal   Collection Time: 12/27/14  5:57 AM  Result Value Ref Range   Glucose-Capillary 247 (H) 70 - 99 mg/dL  Glucose, capillary     Status: Abnormal   Collection Time: 12/27/14 11:30 AM  Result Value Ref Range   Glucose-Capillary 228 (H) 70 - 99 mg/dL   Comment 1 Document in Chart    Comment 2 Repeat Test   Glucose, capillary     Status: Abnormal   Collection Time: 12/27/14  4:48 PM  Result Value Ref Range   Glucose-Capillary 234 (H) 70 -  99 mg/dL  Glucose, capillary     Status: Abnormal   Collection Time: 12/27/14  8:45 PM  Result Value Ref Range   Glucose-Capillary 239 (H) 70 - 99 mg/dL   Comment 1 Notify RN   Glucose, capillary     Status: Abnormal   Collection Time: 12/28/14  5:56 AM  Result Value Ref Range   Glucose-Capillary 163 (H) 70 - 99 mg/dL   Comment 1 Notify RN   Glucose, capillary     Status: Abnormal   Collection Time: 12/28/14 12:01 PM  Result Value Ref Range   Glucose-Capillary 284 (H) 70 - 99 mg/dL  Glucose, capillary     Status: Abnormal   Collection Time: 12/28/14  5:13 PM  Result Value Ref Range   Glucose-Capillary 226 (H) 70 - 99 mg/dL   Comment 1 Document in Chart    Comment 2 Repeat Test   Glucose, capillary     Status: Abnormal   Collection Time: 12/28/14  8:52 PM  Result Value Ref Range   Glucose-Capillary 155 (H) 70 - 99 mg/dL  Glucose, capillary     Status: Abnormal   Collection Time: 12/29/14  6:02 AM  Result Value Ref Range   Glucose-Capillary 119 (H) 70 - 99 mg/dL  Glucose, capillary     Status: Abnormal   Collection Time: 12/29/14  9:58 AM  Result Value Ref Range    Glucose-Capillary 263 (H) 70 - 99 mg/dL  Glucose, capillary     Status: Abnormal   Collection Time: 12/29/14 11:45 AM  Result Value Ref Range   Glucose-Capillary 145 (H) 70 - 99 mg/dL    Physical Findings: AIMS: Facial and Oral Movements Muscles of Facial Expression: None, normal Lips and Perioral Area: None, normal Jaw: None, normal Tongue: None, normal,Extremity Movements Upper (arms, wrists, hands, fingers): None, normal Lower (legs, knees, ankles, toes): None, normal, Trunk Movements Neck, shoulders, hips: None, normal, Overall Severity Severity of abnormal movements (highest score from questions above): None, normal Incapacitation due to abnormal movements: None, normal Patient's awareness of abnormal movements (rate only patient's report): No Awareness, Dental Status Current problems with teeth and/or dentures?: No (Missing some teeth) Does patient usually wear dentures?: No  CIWA:  CIWA-Ar Total: 0 COWS:  COWS Total Score: 2   See Psychiatric Specialty Exam and Suicide Risk Assessment completed by Attending Physician prior to discharge.  Discharge destination:  Home  Is patient on multiple antipsychotic therapies at discharge:  No   Has Patient had three or more failed trials of antipsychotic monotherapy by history:  No  Recommended Plan for Multiple Antipsychotic Therapies: NA      Discharge Instructions    Activity as tolerated - No restrictions    Complete by:  As directed      Diet - low sodium heart healthy    Complete by:  As directed      Diet Carb Modified    Complete by:  As directed      Discharge instructions    Complete by:  As directed   Take all of you medications as prescribed by your mental healthcare provider.  Report any adverse effects and reactions from your medications to your outpatient provider promptly. Do not engage in alcohol and or illegal drug use while on prescription medicines. In the event of worsening symptoms call the crisis  hotline, 911, and or go to the nearest emergency department for appropriate evaluation and treatment of symptoms. Follow-up with your primary care provider for your medical  issues, concerns and or health care needs.   Keep all scheduled appointments.  If you are unable to keep an appointment call to reschedule.  Let the nurse know if you will need medications before next scheduled appointment.            Medication List    STOP taking these medications        HYDROcodone-acetaminophen 5-325 MG per tablet  Commonly known as:  NORCO/VICODIN     oxyCODONE-acetaminophen 5-325 MG per tablet  Commonly known as:  PERCOCET     QUEtiapine 25 MG tablet  Commonly known as:  SEROQUEL     QUEtiapine 50 MG tablet  Commonly known as:  SEROQUEL      TAKE these medications      Indication   amLODipine 10 MG tablet  Commonly known as:  NORVASC  Take 1 tablet (10 mg total) by mouth daily. For high blood pressure   Indication:  High Blood Pressure     benztropine 0.5 MG tablet  Commonly known as:  COGENTIN  Take 1 tablet (0.5 mg total) by mouth 2 (two) times daily. For drug induced extrapyramidal reaction   Indication:  Extrapyramidal Reaction caused by Medications     colchicine 0.6 MG tablet  Take 1 tablet (0.6 mg total) by mouth 2 (two) times daily. For gout   Indication:  Gout     DSS 100 MG Caps  Take 100 mg by mouth 2 (two) times daily. For constipation   Indication:  Constipation     famotidine 20 MG tablet  Commonly known as:  PEPCID  Take 1 tablet (20 mg total) by mouth 2 (two) times daily. For reflux   Indication:  Gastroesophageal Reflux Disease     furosemide 40 MG tablet  Commonly known as:  LASIX  Take 1 tablet (40 mg total) by mouth 2 (two) times daily.   Indication:  Edema, High Blood Pressure     haloperidol 5 MG tablet  Commonly known as:  HALDOL  Take 5 mg (one tablet) every morning and take 10 mg (two tablets) every evening for psychosis   Indication:   Psychosis     hydrALAZINE 50 MG tablet  Commonly known as:  APRESOLINE  Take 1 tablet (50 mg total) by mouth 3 (three) times daily. For high blood pressure   Indication:  High Blood Pressure     insulin aspart 100 UNIT/ML injection  Commonly known as:  novoLOG  Inject 0-9 Units into the skin 3 (three) times daily with meals. Sliding scale CBG 70 - 120: 0 units CBG 121 - 150: 1 unit,  CBG 151 - 200: 2 units,  CBG 201 - 250: 3 units,  CBG 251 - 300: 5 units,  CBG 301 - 350: 7 units,  CBG 351 - 400: 9 units   CBG > 400: 9 units and notify your MD.  For Type 2 diabetes   Indication:  Type 2 Diabetes     insulin NPH-regular Human (70-30) 100 UNIT/ML injection  Commonly known as:  NOVOLIN 70/30  Inject 46 Units into the skin 2 (two) times daily with a meal. For type 2 diabetes   Indication:  Type 2 Diabetes     metFORMIN 500 MG tablet  Commonly known as:  GLUCOPHAGE  Take 1 tablet (500 mg total) by mouth 2 (two) times daily with a meal.   Indication:  Type 2 Diabetes     metoprolol tartrate 25 MG tablet  Commonly known  as:  LOPRESSOR  Take 1 tablet (25 mg total) by mouth daily. For high blood pressure   Indication:  High Blood Pressure     multivitamin with minerals Tabs tablet  Take 1 tablet by mouth daily.   Indication:  nutritional supplementation     polyethylene glycol packet  Commonly known as:  MIRALAX / GLYCOLAX  Take 17 g by mouth daily as needed for moderate constipation.   Indication:  Constipation     sertraline 25 MG tablet  Commonly known as:  ZOLOFT  Take 3 tablets (75 mg total) by mouth daily. For depression   Indication:  Major Depressive Disorder     traZODone 100 MG tablet  Commonly known as:  DESYREL  Take 1 tablet (100 mg total) by mouth at bedtime. For Sleep   Indication:  Trouble Sleeping       Follow-up Information    Follow up with [facility] [facility] On 01/10/2015.   Why:  Tuesday at 10:30 with Dr Salvadore Farber information:    [address]  [address]  [place]  [phone]      Follow up with Primary Care Doctor.   Why:  please follow up with a Family Dr about your history of diabetes.      Follow-up recommendations:  Activity:  as tol, diet as tol  Comments:  1.  Take all your medications as prescribed.              2.  Report any adverse side effects to outpatient provider.                       3.  Patient instructed to not use alcohol or illegal drugs while on prescription medicines.            4.  In the event of worsening symptoms, instructed patient to call 911, the crisis hotline or go to nearest emergency room for evaluation of symptoms.  Total Discharge Time:  30 min  Signed: Adonis Brook MAY, AGNP-BC 12/29/2014, 6:28 PM

## 2014-12-29 NOTE — Progress Notes (Signed)
Patient ID: Coralie KeensLarry B Chipman, male   DOB: 11/22/1955, 59 y.o.   MRN: [id number]  Pt currently presents with a flat affect and cooperative behavior. Per self inventory, pt rates depression at a 2, hopelessness 2 and anxiety 3. Pt's daily goal is to "to be better than I was yesterday" and they intend to do so by "exercise material given, go to groups." Pt reports good sleep, concentration and appetite.  Pt provided with medications per providers orders. Pt's labs and vitals were monitored throughout the day. Pt supported emotionally and encouraged to express concerns and questions. Pt educated on medications. Pt's safety ensured with 15 minute and environmental checks. Pt currently denies SI/HI and A/V hallucinations. Pt verbally agrees to seek staff if SI/HI or A/VH occurs and to consult with staff before acting on these thoughts. Pt to be discharged today.
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
## 2014-12-29 NOTE — [facility] Suicide Risk Assessment (Signed)
[facility] Discharge Suicide Risk Assessment   Demographic Factors:  Male  Total Time spent with patient: 30 minutes  Musculoskeletal: Strength & Muscle Tone: within normal limits Gait & Station: needs a cane to walk Patient leans: N/A  Psychiatric Specialty Exam: Physical Exam  Review of Systems  Psychiatric/Behavioral: Positive for substance abuse (stable). Negative for depression and suicidal ideas. The patient is not nervous/anxious.     Blood pressure 137/75, pulse 98, temperature 97.8 F (36.6 C), temperature source Oral, resp. rate 20, height  (1.676 m), weight 72.576 kg (160 lb), SpO2 98 %.Body mass index is 25.84 kg/(m^2).  General Appearance: Casual  Eye Contact::  Fair  Speech:  Clear and Coherent409  Volume:  Normal  Mood:  Euthymic  Affect:  Congruent  Thought Process:  Coherent  Orientation:  Full (Time, Place, and Person)  Thought Content:  WDL  Suicidal Thoughts:  No  Homicidal Thoughts:  No  Memory:  Immediate;   Fair Recent;   Fair Remote;   Fair  Judgement:  Fair  Insight:  Fair  Psychomotor Activity:  Normal  Concentration:  Fair  Recall:  [employer] of Knowledge:Fair  Language: Fair  Akathisia:  No  Handed:  Right  AIMS (if indicated):     Assets:  Communication Skills Desire for Improvement  Sleep:  Number of Hours: 1.5  Cognition: WNL  ADL's:  Intact      Has this patient used any form of tobacco in the last 30 days? (Cigarettes, Smokeless Tobacco, Cigars, and/or Pipes) No  Mental Status Per Nursing Assessment::   On Admission:     Current Mental Status by Physician: patient denies SI/HI/AH/VH  Loss Factors: Decline in physical health, Legal issues and Financial problems/change in socioeconomic status  Historical Factors: Impulsivity  Risk Reduction Factors:   Living with another person, especially a relative and Positive social support  Continued Clinical Symptoms:  Alcohol/Substance Abuse/Dependencies Previous Psychiatric  Diagnoses and Treatments  Cognitive Features That Contribute To Risk:  None    Suicide Risk:  Minimal: No identifiable suicidal ideation.  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 Cocaine use disorder, severe, dependence [F14.20] 12/22/2014  . PTSD (post-traumatic stress disorder) [F43.10] 12/22/2014  . Protein-calorie malnutrition, severe [E43] 07/28/2014  . Hyponatremia [E87.1] 07/27/2014  . Bacteremia [R78.81] 07/27/2014  . Subarachnoid hemorrhage [I60.9] 06/21/2014  . DM (diabetes mellitus), type 2, uncontrolled [E11.65] 02/13/2013  . Diabetes mellitus [E11.9] 12/08/2011  . Noncompliance [Z91.19] 12/08/2011  . CAD (coronary artery disease) [I25.10] 12/08/2011  . Chest pain [R07.9] 12/08/2011  . Chronic pain [G89.29]   . Gout [M10.9]   . Hypertension [I10]     Follow-up Information    Follow up with [facility]/[place] Office On 01/10/2015.   Why:  Tuesday at 10:30 with Dr Salvadore Farber information:   [address]  [address]  [place]  [phone]      Plan Of Care/Follow-up recommendations:  Activity:  No  restrictions Diet:  carb modified Tests:  as needed Other:  follow up with after care/ Follow up with PMD for DM.  Is patient on multiple antipsychotic therapies at discharge:  No   Has Patient had three or more failed trials of  antipsychotic monotherapy by history:  No  Recommended Plan for Multiple Antipsychotic Therapies: NA    Juhi Lagrange md 12/29/2014, 9:42 AM

## 2015-01-02 NOTE — Progress Notes (Signed)
Patient Discharge Instructions:  Next Level Care Provider Has Access to the EMR, 01/02/15  Records [facility] via [facility]/Epic access.  Jerelene ReddenSheena E Delacruz, 01/02/2015, 2:39 PM

## 2015-01-10 ENCOUNTER — Ambulatory Visit (HOSPITAL_COMMUNITY): Payer: Self-pay | Admitting: Psychiatry

## 2015-01-21 ENCOUNTER — Emergency Department (HOSPITAL_COMMUNITY)
Admission: EM | Admit: 2015-01-21 | Discharge: 2015-01-22 | Payer: Medicaid Other | Attending: Emergency Medicine | Admitting: Emergency Medicine

## 2015-01-21 ENCOUNTER — Encounter (HOSPITAL_COMMUNITY): Payer: Self-pay | Admitting: Emergency Medicine

## 2015-01-21 DIAGNOSIS — E119 Type 2 diabetes mellitus without complications: Secondary | ICD-10-CM | POA: Diagnosis not present

## 2015-01-21 DIAGNOSIS — F4310 Post-traumatic stress disorder, unspecified: Secondary | ICD-10-CM | POA: Diagnosis present

## 2015-01-21 DIAGNOSIS — I252 Old myocardial infarction: Secondary | ICD-10-CM | POA: Insufficient documentation

## 2015-01-21 DIAGNOSIS — Z794 Long term (current) use of insulin: Secondary | ICD-10-CM | POA: Insufficient documentation

## 2015-01-21 DIAGNOSIS — G8929 Other chronic pain: Secondary | ICD-10-CM | POA: Diagnosis not present

## 2015-01-21 DIAGNOSIS — Z8709 Personal history of other diseases of the respiratory system: Secondary | ICD-10-CM | POA: Diagnosis not present

## 2015-01-21 DIAGNOSIS — M109 Gout, unspecified: Secondary | ICD-10-CM | POA: Insufficient documentation

## 2015-01-21 DIAGNOSIS — F319 Bipolar disorder, unspecified: Secondary | ICD-10-CM | POA: Insufficient documentation

## 2015-01-21 DIAGNOSIS — Z79899 Other long term (current) drug therapy: Secondary | ICD-10-CM | POA: Insufficient documentation

## 2015-01-21 DIAGNOSIS — Z8673 Personal history of transient ischemic attack (TIA), and cerebral infarction without residual deficits: Secondary | ICD-10-CM | POA: Insufficient documentation

## 2015-01-21 DIAGNOSIS — F1410 Cocaine abuse, uncomplicated: Secondary | ICD-10-CM | POA: Insufficient documentation

## 2015-01-21 DIAGNOSIS — F14251 Cocaine dependence with cocaine-induced psychotic disorder with hallucinations: Secondary | ICD-10-CM

## 2015-01-21 DIAGNOSIS — I10 Essential (primary) hypertension: Secondary | ICD-10-CM | POA: Diagnosis not present

## 2015-01-21 DIAGNOSIS — R440 Auditory hallucinations: Secondary | ICD-10-CM | POA: Diagnosis present

## 2015-01-21 DIAGNOSIS — F251 Schizoaffective disorder, depressive type: Secondary | ICD-10-CM | POA: Diagnosis present

## 2015-01-21 DIAGNOSIS — F209 Schizophrenia, unspecified: Secondary | ICD-10-CM | POA: Insufficient documentation

## 2015-01-21 DIAGNOSIS — F1420 Cocaine dependence, uncomplicated: Secondary | ICD-10-CM | POA: Diagnosis present

## 2015-01-21 DIAGNOSIS — R45851 Suicidal ideations: Secondary | ICD-10-CM | POA: Diagnosis not present

## 2015-01-21 DIAGNOSIS — Z87891 Personal history of nicotine dependence: Secondary | ICD-10-CM | POA: Insufficient documentation

## 2015-01-21 LAB — RAPID URINE DRUG SCREEN, HOSP PERFORMED
Amphetamines: NOT DETECTED
BARBITURATES: NOT DETECTED
BENZODIAZEPINES: NOT DETECTED
Cocaine: POSITIVE — AB
OPIATES: NOT DETECTED
Tetrahydrocannabinol: NOT DETECTED

## 2015-01-21 LAB — CBG MONITORING, ED
GLUCOSE-CAPILLARY: 305 mg/dL — AB (ref 70–99)
GLUCOSE-CAPILLARY: 308 mg/dL — AB (ref 70–99)
GLUCOSE-CAPILLARY: 198 mg/dL — AB (ref 70–99)
Glucose-Capillary: 422 mg/dL — ABNORMAL HIGH (ref 70–99)
Glucose-Capillary: 309 mg/dL — ABNORMAL HIGH (ref 70–99)
Glucose-Capillary: 265 mg/dL — ABNORMAL HIGH (ref 70–99)

## 2015-01-21 LAB — COMPREHENSIVE METABOLIC PANEL
ALBUMIN: 4.1 g/dL (ref 3.5–5.2)
ALT: 32 U/L (ref 0–53)
ANION GAP: 15 (ref 5–15)
AST: 41 U/L — ABNORMAL HIGH (ref 0–37)
Alkaline Phosphatase: 90 U/L (ref 39–117)
BUN: 39 mg/dL — AB (ref 6–23)
CALCIUM: 8.1 mg/dL — AB (ref 8.4–10.5)
CO2: 23 mmol/L (ref 19–32)
CREATININE: 1.46 mg/dL — AB (ref 0.50–1.35)
Chloride: 93 mmol/L — ABNORMAL LOW (ref 96–112)
GFR calc non Af Amer: 51 mL/min — ABNORMAL LOW (ref >90)
GFR, EST AFRICAN AMERICAN: 59 mL/min — AB (ref >90)
GLUCOSE: 597 mg/dL — AB (ref 70–99)
POTASSIUM: 3.8 mmol/L (ref 3.5–5.1)
SODIUM: 131 mmol/L — AB (ref 135–145)
TOTAL PROTEIN: 8.1 g/dL (ref 6.0–8.3)
Total Bilirubin: 0.7 mg/dL (ref 0.3–1.2)

## 2015-01-21 LAB — CBC
HCT: 37.4 % — ABNORMAL LOW (ref 39.0–52.0)
HEMOGLOBIN: 12.9 g/dL — AB (ref 13.0–17.0)
MCH: 29.7 pg (ref 26.0–34.0)
MCHC: 34.5 g/dL (ref 30.0–36.0)
MCV: 86 fL (ref 78.0–100.0)
Platelets: 283 10*3/uL (ref 150–400)
RBC: 4.35 MIL/uL (ref 4.22–5.81)
RDW: 13.9 % (ref 11.5–15.5)
WBC: 6.9 10*3/uL (ref 4.0–10.5)

## 2015-01-21 LAB — SALICYLATE LEVEL: Salicylate Lvl: <4 mg/dL (ref 2.8–20.0)

## 2015-01-21 LAB — ETHANOL: Alcohol, Ethyl (B): <5 mg/dL (ref 0–9)

## 2015-01-21 LAB — ACETAMINOPHEN LEVEL

## 2015-01-21 MED ORDER — FUROSEMIDE 40 MG PO TABS
40.0000 mg | ORAL_TABLET | Freq: Two times a day (BID) | ORAL | Status: DC
  Administered 2015-01-21 – 2015-01-22 (×3): 40 mg via ORAL
  Filled 2015-01-21 (×5): qty 1

## 2015-01-21 MED ORDER — INSULIN ASPART 100 UNIT/ML ~~LOC~~ SOLN
0.0000 [IU] | Freq: Three times a day (TID) | SUBCUTANEOUS | Status: DC
  Administered 2015-01-21 (×2): 11 [IU] via SUBCUTANEOUS
  Administered 2015-01-22: 8 [IU] via SUBCUTANEOUS
  Administered 2015-01-22: 5 [IU] via SUBCUTANEOUS
  Filled 2015-01-21 (×3): qty 1

## 2015-01-21 MED ORDER — METFORMIN HCL 500 MG PO TABS
500.0000 mg | ORAL_TABLET | Freq: Two times a day (BID) | ORAL | Status: DC
  Administered 2015-01-21 – 2015-01-22 (×3): 500 mg via ORAL
  Filled 2015-01-21 (×5): qty 1

## 2015-01-21 MED ORDER — AMLODIPINE BESYLATE 10 MG PO TABS
10.0000 mg | ORAL_TABLET | Freq: Every day | ORAL | Status: DC
  Administered 2015-01-21 – 2015-01-22 (×2): 10 mg via ORAL
  Filled 2015-01-21 (×2): qty 1

## 2015-01-21 MED ORDER — INSULIN ASPART 100 UNIT/ML ~~LOC~~ SOLN: 0.0000 [IU] | Freq: Three times a day (TID) | SUBCUTANEOUS | Status: DC

## 2015-01-21 MED ORDER — TRAZODONE HCL 100 MG PO TABS
100.0000 mg | ORAL_TABLET | Freq: Every day | ORAL | Status: DC
  Administered 2015-01-21: 100 mg via ORAL
  Filled 2015-01-21: qty 1

## 2015-01-21 MED ORDER — ADULT MULTIVITAMIN W/MINERALS CH
1.0000 | ORAL_TABLET | Freq: Every day | ORAL | Status: DC
  Administered 2015-01-21 – 2015-01-22 (×2): 1 via ORAL
  Filled 2015-01-21 (×2): qty 1

## 2015-01-21 MED ORDER — INSULIN ASPART 100 UNIT/ML ~~LOC~~ SOLN
10.0000 [IU] | Freq: Once | SUBCUTANEOUS | Status: AC
  Administered 2015-01-21: 10 [IU] via SUBCUTANEOUS
  Filled 2015-01-21: qty 1

## 2015-01-21 MED ORDER — ACETAMINOPHEN 325 MG PO TABS: 650.0000 mg | ORAL_TABLET | Freq: Four times a day (QID) | ORAL | Status: DC | PRN

## 2015-01-21 MED ORDER — SODIUM CHLORIDE 0.9 % IV BOLUS (SEPSIS)
1000.0000 mL | Freq: Once | INTRAVENOUS | Status: AC
  Administered 2015-01-21: 1000 mL via INTRAVENOUS

## 2015-01-21 MED ORDER — HYDRALAZINE HCL 50 MG PO TABS
50.0000 mg | ORAL_TABLET | Freq: Three times a day (TID) | ORAL | Status: DC
  Administered 2015-01-21 – 2015-01-22 (×4): 50 mg via ORAL
  Filled 2015-01-21 (×8): qty 1

## 2015-01-21 MED ORDER — BENZTROPINE MESYLATE 1 MG PO TABS
0.5000 mg | ORAL_TABLET | Freq: Two times a day (BID) | ORAL | Status: DC
  Administered 2015-01-21 – 2015-01-22 (×3): 0.5 mg via ORAL
  Filled 2015-01-21 (×3): qty 1

## 2015-01-21 MED ORDER — FAMOTIDINE 20 MG PO TABS
20.0000 mg | ORAL_TABLET | Freq: Two times a day (BID) | ORAL | Status: DC
  Administered 2015-01-21 – 2015-01-22 (×3): 20 mg via ORAL
  Filled 2015-01-21 (×3): qty 1

## 2015-01-21 MED ORDER — INSULIN ASPART 100 UNIT/ML ~~LOC~~ SOLN
0.0000 [IU] | Freq: Every day | SUBCUTANEOUS | Status: DC
  Administered 2015-01-21: 3 [IU] via SUBCUTANEOUS
  Filled 2015-01-21: qty 1

## 2015-01-21 MED ORDER — METOPROLOL TARTRATE 25 MG PO TABS
25.0000 mg | ORAL_TABLET | Freq: Every day | ORAL | Status: DC
  Administered 2015-01-21 – 2015-01-22 (×2): 25 mg via ORAL
  Filled 2015-01-21 (×2): qty 1

## 2015-01-21 MED ORDER — SERTRALINE HCL 50 MG PO TABS
75.0000 mg | ORAL_TABLET | Freq: Every day | ORAL | Status: DC
  Administered 2015-01-21 – 2015-01-22 (×2): 75 mg via ORAL
  Filled 2015-01-21 (×2): qty 2

## 2015-01-21 NOTE — ED Notes (Addendum)
Awake. Verbally responsive. Resp even and unlabored. ABC's intact. No behavior problems noted. Pt reported audible and visual hallucinations. Reported thoughts of SI but has no plan. NAD noted.

## 2015-01-21 NOTE — ED Notes (Signed)
Bed: [id number] Expected date:  Expected time:  Means of arrival:  Comments: Hold for [employer] Partners[employer]ry Renfro

## 2015-01-21 NOTE — ED Notes (Signed)
Awaiting medication administration until meal tray arrives.
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
## 2015-01-21 NOTE — ED Notes (Signed)
Bed: WA12 Expected date:  Expected time:  Means of arrival:  Comments: 

## 2015-01-21 NOTE — BH Assessment (Signed)
Assessment completed. Consulted Birdena JubileeIjeoma Nwaze, NP who agrees that pt meets inpatient criteria.Fayrene HelperBowie Tran, PA-C has been informed of the recommendation. [facility].

## 2015-01-21 NOTE — ED Notes (Signed)
Bed: WA27 Expected date:  Expected time:  Means of arrival:  Comments: Rm 25
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
## 2015-01-21 NOTE — ED Provider Notes (Signed)
Patient presents with auditory hallucination SI or HI. TTS is evaluate patient he meets criteria for admission. He does have history of diabetes, his CBG is 597 without an anion gap acidosis. will provide IV fluid, give insulin, and will correct his glucose level.  10:00 AM Nurse notified that pt appears lethargic.  Pt is hyperglycemic, but no anion gap acidosis.  Will check UDS, continue with current treatment and monitor closely    2:38 PM Patient is sleepy, but easily arousable. He admits that he has not been sleeping much due to hearing voices in his head and also using cocaine, last use was yesterday. He denies having active chest pain or shortness of breath. He endorsed low back pain that is chronic in nature but denies lightheadedness or dizziness.  3:14 PM CBG improves to 198.  He is medically cleared.  Pt will be admitted for psychiatric management for hallucination and SI.    BP 113/53 mmHg  Pulse 81  Temp(Src) 98.8 F (37.1 C) (Oral)  Resp 18  SpO2 97%  I have reviewed nursing notes and vital signs. I personally reviewed the imaging tests through PACS system  I reviewed available ER/hospitalization records thought the EMR  Results for orders placed or performed during the hospital encounter of 01/21/15  Acetaminophen level  Result Value Ref Range   Acetaminophen (Tylenol), Serum <10.0 (L) 10 - 30 ug/mL  CBC  Result Value Ref Range   WBC 6.9 4.0 - 10.5 K/uL   RBC 4.35 4.22 - 5.81 MIL/uL   Hemoglobin 12.9 (L) 13.0 - 17.0 g/dL   HCT 91.437.4 (L) 78.239.0 - 95.652.0 %   MCV 86.0 78.0 - 100.0 fL   MCH 29.7 26.0 - 34.0 pg   MCHC 34.5 30.0 - 36.0 g/dL   RDW 21.313.9 08.611.5 - 57.815.5 %   Platelets 283 150 - 400 K/uL  Comprehensive metabolic panel  Result Value Ref Range   Sodium 131 (L) 135 - 145 mmol/L   Potassium 3.8 3.5 - 5.1 mmol/L   Chloride 93 (L) 96 - 112 mmol/L   CO2 23 19 - 32 mmol/L   Glucose, Bld 597 (HH) 70 - 99 mg/dL   BUN 39 (H) 6 - 23 mg/dL   Creatinine, Ser 4.691.46 (H) 0.50 -  1.35 mg/dL   Calcium 8.1 (L) 8.4 - 10.5 mg/dL   Total Protein 8.1 6.0 - 8.3 g/dL   Albumin 4.1 3.5 - 5.2 g/dL   AST 41 (H) 0 - 37 U/L   ALT 32 0 - 53 U/L   Alkaline Phosphatase 90 39 - 117 U/L   Total Bilirubin 0.7 0.3 - 1.2 mg/dL   GFR calc non Af Amer 51 (L) >90 mL/min   GFR calc Af Amer 59 (L) >90 mL/min   Anion gap 15 5 - 15  Ethanol (ETOH)  Result Value Ref Range   Alcohol, Ethyl (B) <5 0 - 9 mg/dL  Salicylate level  Result Value Ref Range   Salicylate Lvl <4.0 2.8 - 20.0 mg/dL  Urine Drug Screen  Result Value Ref Range   Opiates NONE DETECTED NONE DETECTED   Cocaine POSITIVE (A) NONE DETECTED   Benzodiazepines NONE DETECTED NONE DETECTED   Amphetamines NONE DETECTED NONE DETECTED   Tetrahydrocannabinol NONE DETECTED NONE DETECTED   Barbiturates NONE DETECTED NONE DETECTED  CBG monitoring, ED  Result Value Ref Range   Glucose-Capillary 422 (H) 70 - 99 mg/dL  CBG monitoring, ED  Result Value Ref Range   Glucose-Capillary 305 (H)  70 - 99 mg/dL  CBG monitoring, ED  Result Value Ref Range   Glucose-Capillary 308 (H) 70 - 99 mg/dL  CBG monitoring, ED  Result Value Ref Range   Glucose-Capillary 198 (H) 70 - 99 mg/dL   No results found.   Date: 01/21/2015  Rate: 81  Rhythm: normal sinus rhythm  QRS Axis: normal  Intervals: normal  ST/T Wave abnormalities: nonspecific ST waves changes  Conduction Disutrbances: QT prolongation  Narrative Interpretation:   Old EKG Reviewed: No significant changes noted     Fayrene Helper, PA-C 01/21/15 1515  Fayrene Helper, PA-C 01/21/15 1541  Azalia Bilis, MD 01/21/15 2302

## 2015-01-21 NOTE — ED Notes (Signed)
Pt belongings in locker 29. 1 pt belonging bag with 1 pair of black shoes

## 2015-01-21 NOTE — ED Notes (Addendum)
Given report to Comstockanika, RCharity fundraiser

## 2015-01-21 NOTE — ED Notes (Signed)
PA in TCU to check on patient.  Ordered another CBG.  Patient remains sleepy, waking to eat lunch then going back to sleep.

## 2015-01-21 NOTE — BHH Counselor (Signed)
Thurman CoyerEric Kaplan, [facility] at [facility], confirmed adult acute unit is currently at capacity. Contacted the following facilities for placement:  BED AVAILABLE, FAXED CLINICAL INFORMATION:  [facility], per [facility], per [employer], per [facility], per [facility]  AT CAPACITY: [facility], per [facility], per [facility], per [employer] [facility] [employer], per [facility], per [facility], per [facility], per [facility], per [facility], per [facility], per [facility], per [facility], per [facility] [place], per [facility], per [facility], per [facility], per [facility], per [facility], per Erasmo LeventhalStephanie   Arcadio Cope Ellis Orlando Thalmann Jr, [facility], [facility] Triage Specialist [phone]

## 2015-01-21 NOTE — ED Notes (Signed)
Patient very sleepy, lethargic.  Wants to eat but cannot stay awake.  MD notified.

## 2015-01-21 NOTE — ED Notes (Signed)
Pt reports he is hearing voices telling him to harm himself and other people. Pt has hx of schizophrenia and biopolar and reports he has not been taking his medication. Pt does not have a plan as to how he would harm himself.

## 2015-01-21 NOTE — ED Provider Notes (Signed)
CSN: [id number]     Arrival date & time 01/21/15  0455 History   First MD Initiated Contact with Patient 01/21/15 [phone]     Chief Complaint  Patient presents with  . Hallucinations    auditory  . Suicidal  . Homicidal     (Consider location/radiation/quality/duration/timing/severity/associated sxs/prior Treatment) HPI Comments: Patient has auditory hallucinations.  He says he's had them for years.  He has been out of his medications for approximately one week.  He has all his other medical medications and has been taking them on a regular basis.  He states the voices are telling him to hurt himself.  He has not acted on this hallucination in quite a while  The history is provided by the patient.    Past Medical History  Diagnosis Date  . Diabetes mellitus   . Hypertension   . Gout   . MI (myocardial infarction)   . Chronic pain     right elbow  . Major depression, chronic   . Bipolar disorder   . Schizophrenia   . Subarachnoid hemorrhage september 2015  . Acute respiratory failure September 2015  . 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 Bacteremia   . Chronic pain   . Stroke    Past Surgical History  Procedure Laterality Date  . Coronary angioplasty with stent placement      2008  . Knee surgery      both  . Tee without cardioversion N/A 07/26/2014    Procedure: TRANSESOPHAGEAL ECHOCARDIOGRAM (TEE);  Surgeon: Lars MassonKatarina H Nelson, MD;  [facility];  Service: Cardiovascular;  Laterality: N/A;   Family History  Problem Relation Age of Onset  . Diabetic kidney disease Mother   . Hypertension Mother   . Gout Mother   . Diabetic kidney disease Father   . Heart attack Brother 37   History  Substance Use Topics  . Smoking status: Former Smoker -- .1 years    Types: Cigarettes    Quit date: 01/07/2013  . Smokeless tobacco: Never Used  . Alcohol Use: No     Comment: denies    Review of Systems  Respiratory:  Negative for cough.   Cardiovascular: Negative for chest pain.  Genitourinary: Negative for dysuria.  Neurological: Negative for dizziness and headaches.  Psychiatric/Behavioral: Positive for hallucinations.  All other systems reviewed and are negative.     Allergies  Fish allergy; Oatmeal; Tomato; and Lactose intolerance (gi)  Home Medications   Prior to Admission medications   Medication Sig Start Date End Date Taking? Authorizing Provider  amLODipine (NORVASC) 10 MG tablet Take 1 tablet (10 mg total) by mouth daily. For high blood pressure 12/29/14   Shuvon B Rankin, NP  benztropine (COGENTIN) 0.5 MG tablet Take 1 tablet (0.5 mg total) by mouth 2 (two) times daily. For drug induced extrapyramidal reaction 12/29/14   Shuvon B Rankin, NP  colchicine 0.6 MG tablet Take 1 tablet (0.6 mg total) by mouth 2 (two) times daily. For gout 12/29/14   Shuvon B Rankin, NP  Docusate Sodium (DSS) 100 MG CAPS Take 100 mg by mouth 2 (two) times daily. For constipation 12/29/14   Shuvon B Rankin, NP  famotidine (PEPCID) 20 MG tablet Take 1 tablet (20 mg total) by mouth 2 (two) times daily. For reflux 12/29/14   Shuvon B Rankin, NP  furosemide (LASIX) 40 MG tablet Take 1 tablet (40 mg total) by mouth 2 (two) times daily.  12/29/14   Adonis Brook, NP  haloperidol (HALDOL) 5 MG tablet Take 5 mg (one tablet) every morning and take 10 mg (two tablets) every evening for psychosis 12/29/14   Shuvon B Rankin, NP  hydrALAZINE (APRESOLINE) 50 MG tablet Take 1 tablet (50 mg total) by mouth 3 (three) times daily. For high blood pressure 12/29/14   Shuvon B Rankin, NP  insulin aspart (NOVOLOG) 100 UNIT/ML injection Inject 0-9 Units into the skin 3 (three) times daily with meals. Sliding scale CBG 70 - 120: 0 units CBG 121 - 150: 1 unit,  CBG 151 - 200: 2 units,  CBG 201 - 250: 3 units,  CBG 251 - 300: 5 units,  CBG 301 - 350: 7 units,  CBG 351 - 400: 9 units   CBG > 400: 9 units and notify your MD.  For Type 2 diabetes 12/29/14    Shuvon B Rankin, NP  insulin NPH-regular Human (NOVOLIN 70/30) (70-30) 100 UNIT/ML injection Inject 46 Units into the skin 2 (two) times daily with a meal. For type 2 diabetes 12/29/14   Shuvon B Rankin, NP  metFORMIN (GLUCOPHAGE) 500 MG tablet Take 1 tablet (500 mg total) by mouth 2 (two) times daily with a meal. 12/29/14   Shuvon B Rankin, NP  metoprolol tartrate (LOPRESSOR) 25 MG tablet Take 1 tablet (25 mg total) by mouth daily. For high blood pressure 12/29/14   Shuvon B Rankin, NP  Multiple Vitamin (MULTIVITAMIN WITH MINERALS) TABS tablet Take 1 tablet by mouth daily. 12/29/14   Adonis Brook, NP  polyethylene glycol [facility] / GLYCOLAX) packet Take 17 g by mouth daily as needed for moderate constipation. 12/29/14   Adonis Brook, NP  sertraline (ZOLOFT) 25 MG tablet Take 3 tablets (75 mg total) by mouth daily. For depression 12/29/14   Shuvon B Rankin, NP  traZODone (DESYREL) 100 MG tablet Take 1 tablet (100 mg total) by mouth at bedtime. For Sleep 12/29/14   Shuvon B Rankin, NP   There were no vitals taken for this visit. Physical Exam  Constitutional: He appears well-developed and well-nourished.  HENT:  Head: Normocephalic.  Right Ear: External ear normal.  Left Ear: External ear normal.  Eyes: Pupils are equal, round, and reactive to light.  Neck: Normal range of motion.  Cardiovascular: Normal rate.   Pulmonary/Chest: Effort normal and breath sounds normal.  Abdominal: Soft. He exhibits no distension. There is no tenderness.  Musculoskeletal: Normal range of motion.  Complains of bilateral knee joints, stiffness  Neurological: He is alert.  Skin: Skin is warm.  Psychiatric: His speech is normal. He is actively hallucinating. Cognition and memory are normal. He expresses inappropriate judgment. He exhibits a depressed mood. He expresses suicidal ideation.  Nursing note and vitals reviewed.   ED Course  Procedures (including critical care time) Labs Review Labs Reviewed   ACETAMINOPHEN LEVEL  CBC  COMPREHENSIVE METABOLIC PANEL  ETHANOL  SALICYLATE LEVEL  URINE RAPID DRUG SCREEN (HOSP PERFORMED)    Imaging Review No results found.   EKG Interpretation None     Patient will be moved to the psychiatric area and at TTS evaluation MDM   Final diagnoses:  None         Earley Favor, NP 01/21/15 0555  Earley Favor, NP 01/21/15 0559  Azalia Bilis, MD 01/21/15 [phone]

## 2015-01-22 DIAGNOSIS — R45851 Suicidal ideations: Secondary | ICD-10-CM | POA: Diagnosis not present

## 2015-01-22 DIAGNOSIS — F251 Schizoaffective disorder, depressive type: Secondary | ICD-10-CM | POA: Diagnosis not present

## 2015-01-22 LAB — CBG MONITORING, ED
Glucose-Capillary: 296 mg/dL — ABNORMAL HIGH (ref 70–99)
Glucose-Capillary: 248 mg/dL — ABNORMAL HIGH (ref 70–99)

## 2015-01-22 MED ORDER — HALOPERIDOL 5 MG PO TABS
5.0000 mg | ORAL_TABLET | Freq: Two times a day (BID) | ORAL | Status: DC
  Filled 2015-01-22: qty 1

## 2015-01-22 NOTE — ED Notes (Signed)
[facility] called at [phone] for pick up. Left a message for pt to be picked up. Vwilliams,rn.

## 2015-01-22 NOTE — Progress Notes (Addendum)
1:36pm. CSW faxed IVC paperwork, signed by MD Juleen ChinaKohut, to [employer]. Magistrate Ethelene Brownsnthony received, approved, and stated law enforcement has been dispatched.  3:14pm. CSW [place]ed [place] [facility]. CSW spoke with Tammy, intake Financial controllerworker. Tammy confirms that they received IVC paperwork.   Mariann LasterAlexandra Bryanah Sidell LCSWA,     ED CSW  phone: [phone]

## 2015-01-22 NOTE — ED Notes (Signed)
[facility] Left unit in wheelchair pushed by nurse tech. Left in good condition transported by sheriff department. Report called and given to [facility]on Brassick [facility]. All questions answered to DIRECTVick's satisfaction. All of patient's paperwork to include IVC documents sent by officer. Vwilliams,rn.
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
## 2015-01-22 NOTE — BH Assessment (Signed)
[facility] Assessment Progress Note   Update:  [facility]45 [facility]arker. [facility] and nurse to nurse report is to be called at [phone]. Deedre can be reached at [phone]. Please fax IVC paperwork to [phone] prior to pt being transported.  Informed TTS staff.  Casimer LaniusKristen Nyko Gell, MS, [facility] Therapeutic Triage Specialist [facility]

## 2015-02-09 ENCOUNTER — Ambulatory Visit (HOSPITAL_COMMUNITY): Payer: Self-pay | Admitting: Psychiatry

## 2016-06-02 IMAGING — CT CT CERVICAL SPINE W/O CM
4 of 5 series · 16 of 33 positions shown, 18 images · non-contrast
Comparison: 07/16/2012, 12/08/2011

CLINICAL DATA: Patient fell after having a stroke

EXAM:
CT HEAD WITHOUT CONTRAST
CT CERVICAL SPINE WITHOUT CONTRAST
TECHNIQUE: Multidetector CT imaging of the head and cervical spine was
performed following the standard protocol without intravenous
contrast. Multiplanar CT image reconstructions of the cervical spine
were also generated.

[Series 6: c_spine 2.0 i40s 3 · axial · 0.28mm/px · z∈[-199,-81]mm · 4 of 99 slices shown, 5 images]
[im 20/99  soft-tissue]
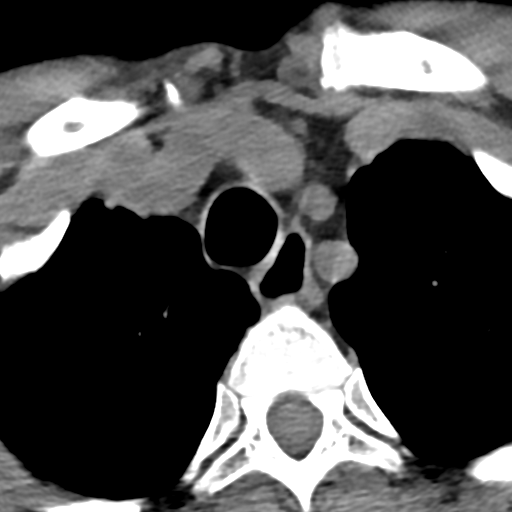
[im 20/99  bone]
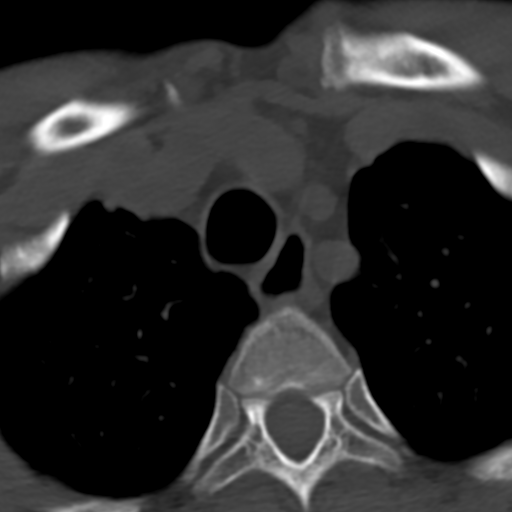
[im 40/99  bone]
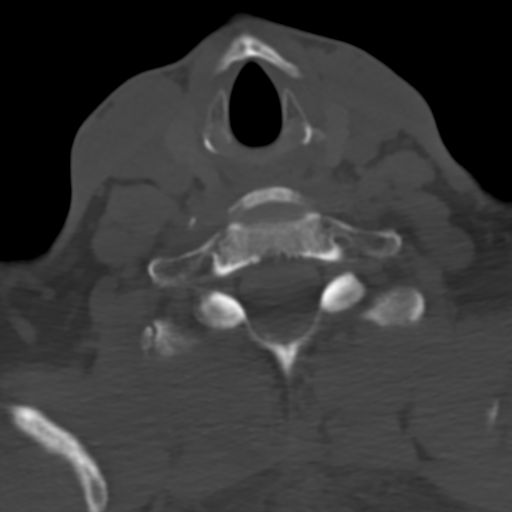
[im 59/99  bone]
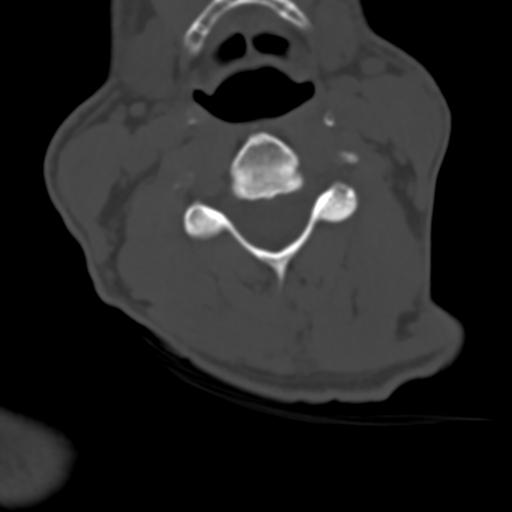
[im 79/99  bone]
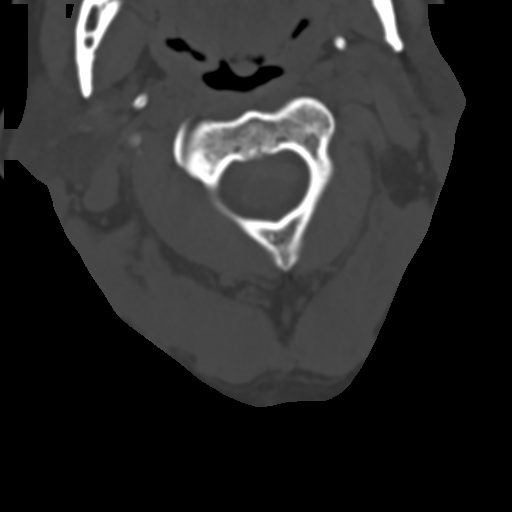

[Series 8: coronals · coronal · 0.38mm/px · 3 of 83 slices shown]
[im 17/83  bone]
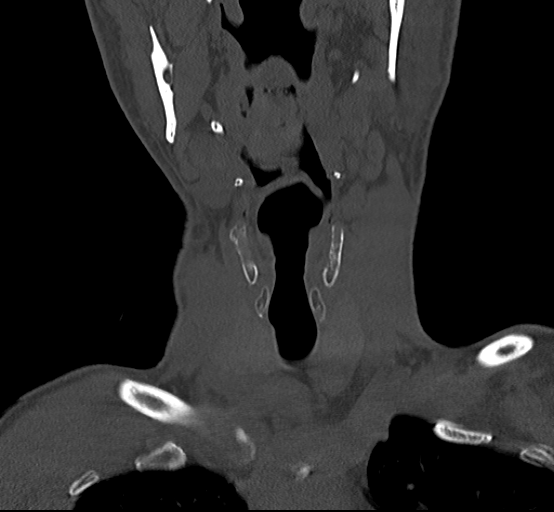
[im 33/83  bone]
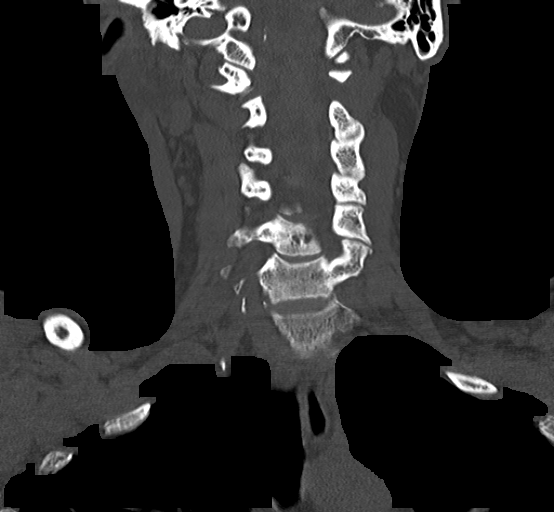
[im 50/83  bone]
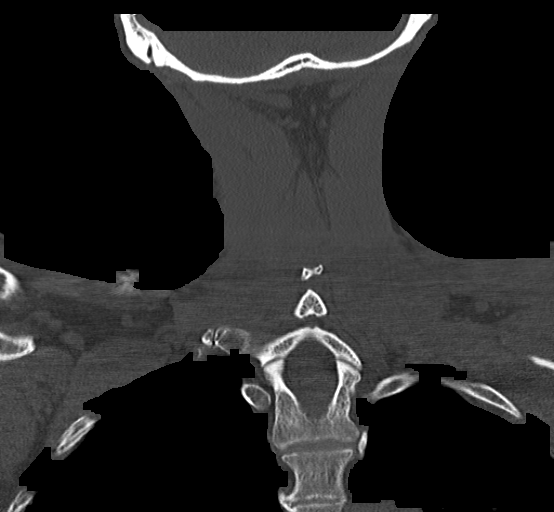

[Series 9: sagittals · sagittal · 0.38mm/px · 5 of 83 slices shown, 6 images]
[im 28/83  bone]
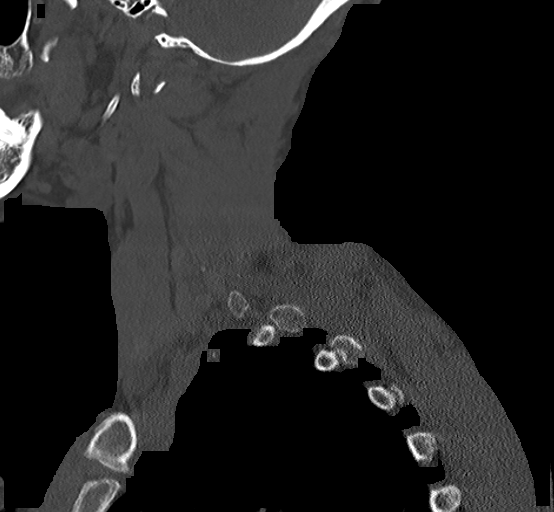
[im 35/83  bone]
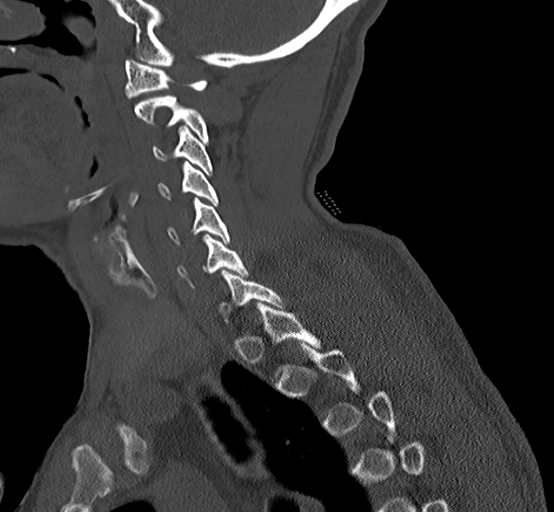
[im 42/83  soft-tissue]
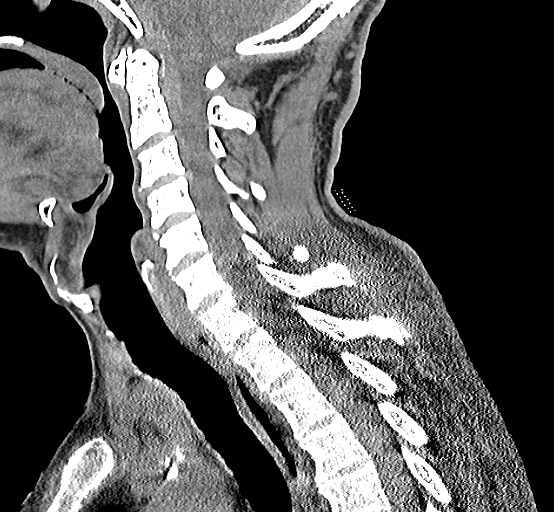
[im 42/83  bone]
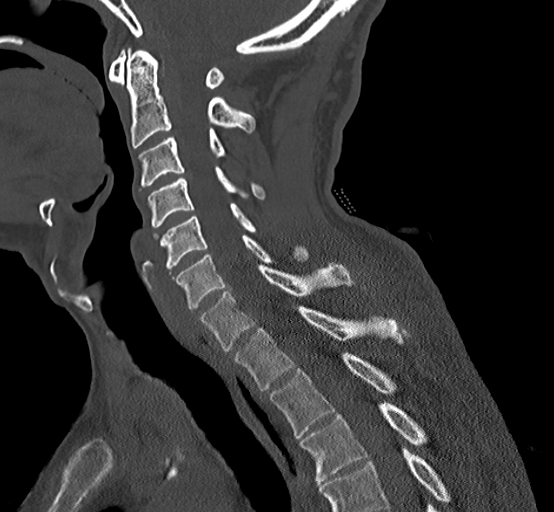
[im 48/83  bone]
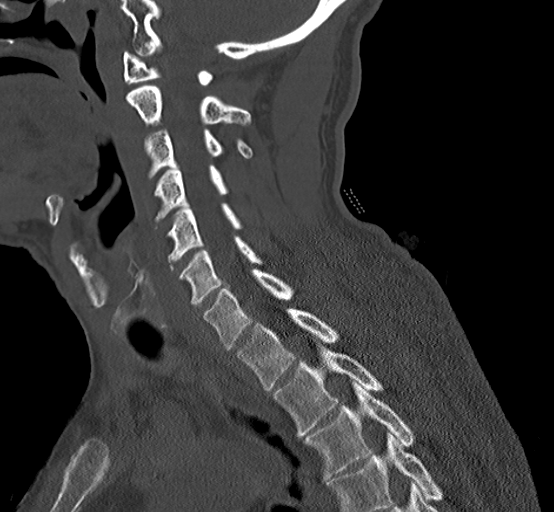
[im 55/83  bone]
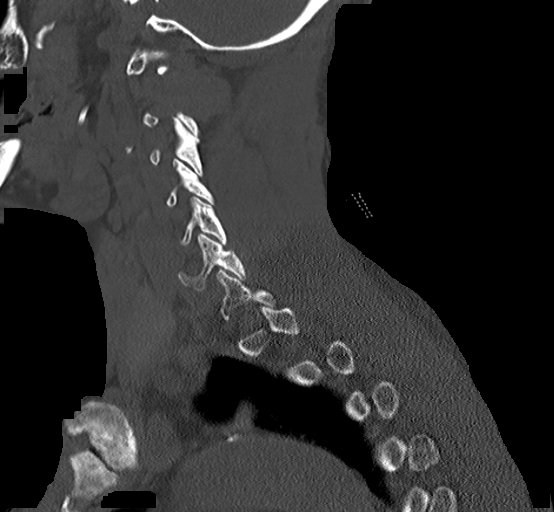

[Series 10: orthogonals · axial · 0.32mm/px · z∈[-220,-97]mm · 4 of 108 slices shown]
[im 22/108  bone]
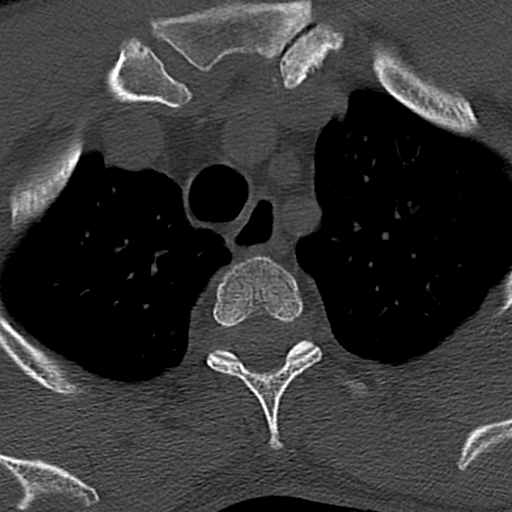
[im 43/108  bone]
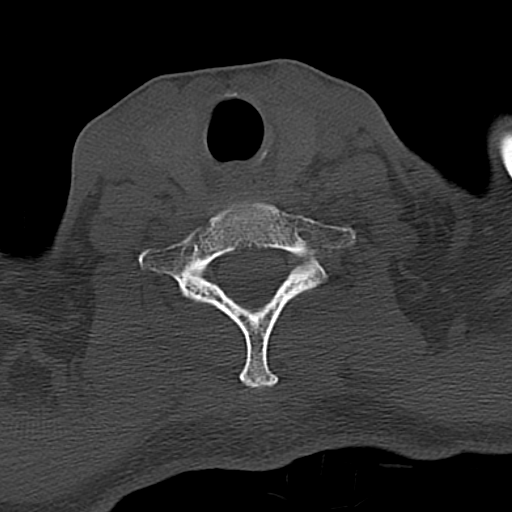
[im 65/108  bone]
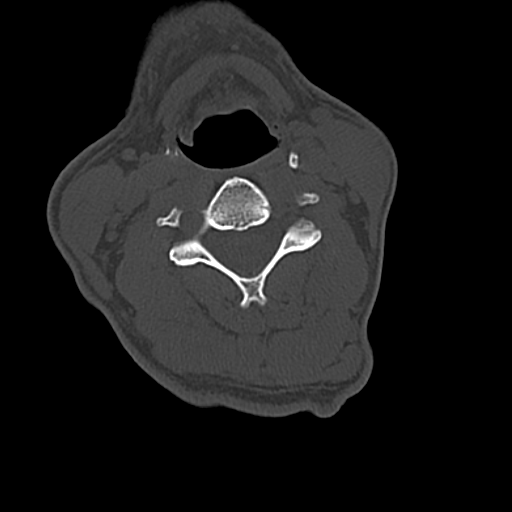
[im 86/108  bone]
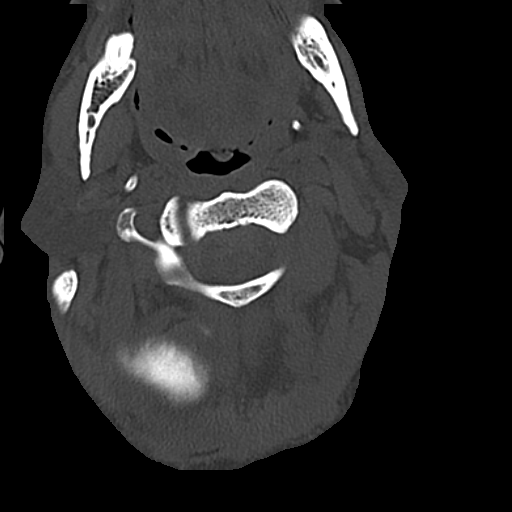

[16 of 33 positions shown; findings below may reference images not displayed]

FINDINGS: CT HEAD FINDINGS

There is no evidence of mass effect, midline shift, or extra-axial
fluid collections. There is a small amount of hemorrhage within the
foramen magnum which is decreased compared with the prior exam.
There is no evidence of a space-occupying lesion . There is no
evidence of a cortical-based area of acute infarction.

The ventricles and sulci are appropriate for the patient's age. The
basal cisterns are patent.

Visualized portions of the orbits are unremarkable. The visualized
portions of the paranasal sinuses and mastoid air cells are
unremarkable. Cerebrovascular atherosclerotic calcifications are
noted.

The osseous structures are unremarkable.

CT CERVICAL SPINE FINDINGS

The alignment is anatomic. The vertebral body heights are
maintained. There is no acute fracture. There is no static
listhesis. The prevertebral soft tissues are normal. The intraspinal
soft tissues are not fully imaged on this examination due to poor
soft tissue contrast, but there is no gross soft tissue abnormality.

Broad central disc protrusion at C3-4. Mild broad-based disc bulge
at C4-5, C5-6 and C6-7. No foraminal stenosis. The disc heights are
relatively well maintained.

The visualized portions of the lung apices demonstrate no focal
abnormality.
IMPRESSION: 1. Small amount of hemorrhage persists within the foramen magnum. No
new areas of hemorrhage.
2. No acute osseous injury of the cervical spine.
3. Mild cervical spondylosis as described above.

## 2016-06-07 IMAGING — CR DG CHEST 1V PORT
1 series · 1 of 1 positions shown · non-contrast
Comparison: July 21, 2014.

CLINICAL DATA: Shortness of breath, cough.

EXAM:
PORTABLE CHEST - 1 VIEW

[AP]
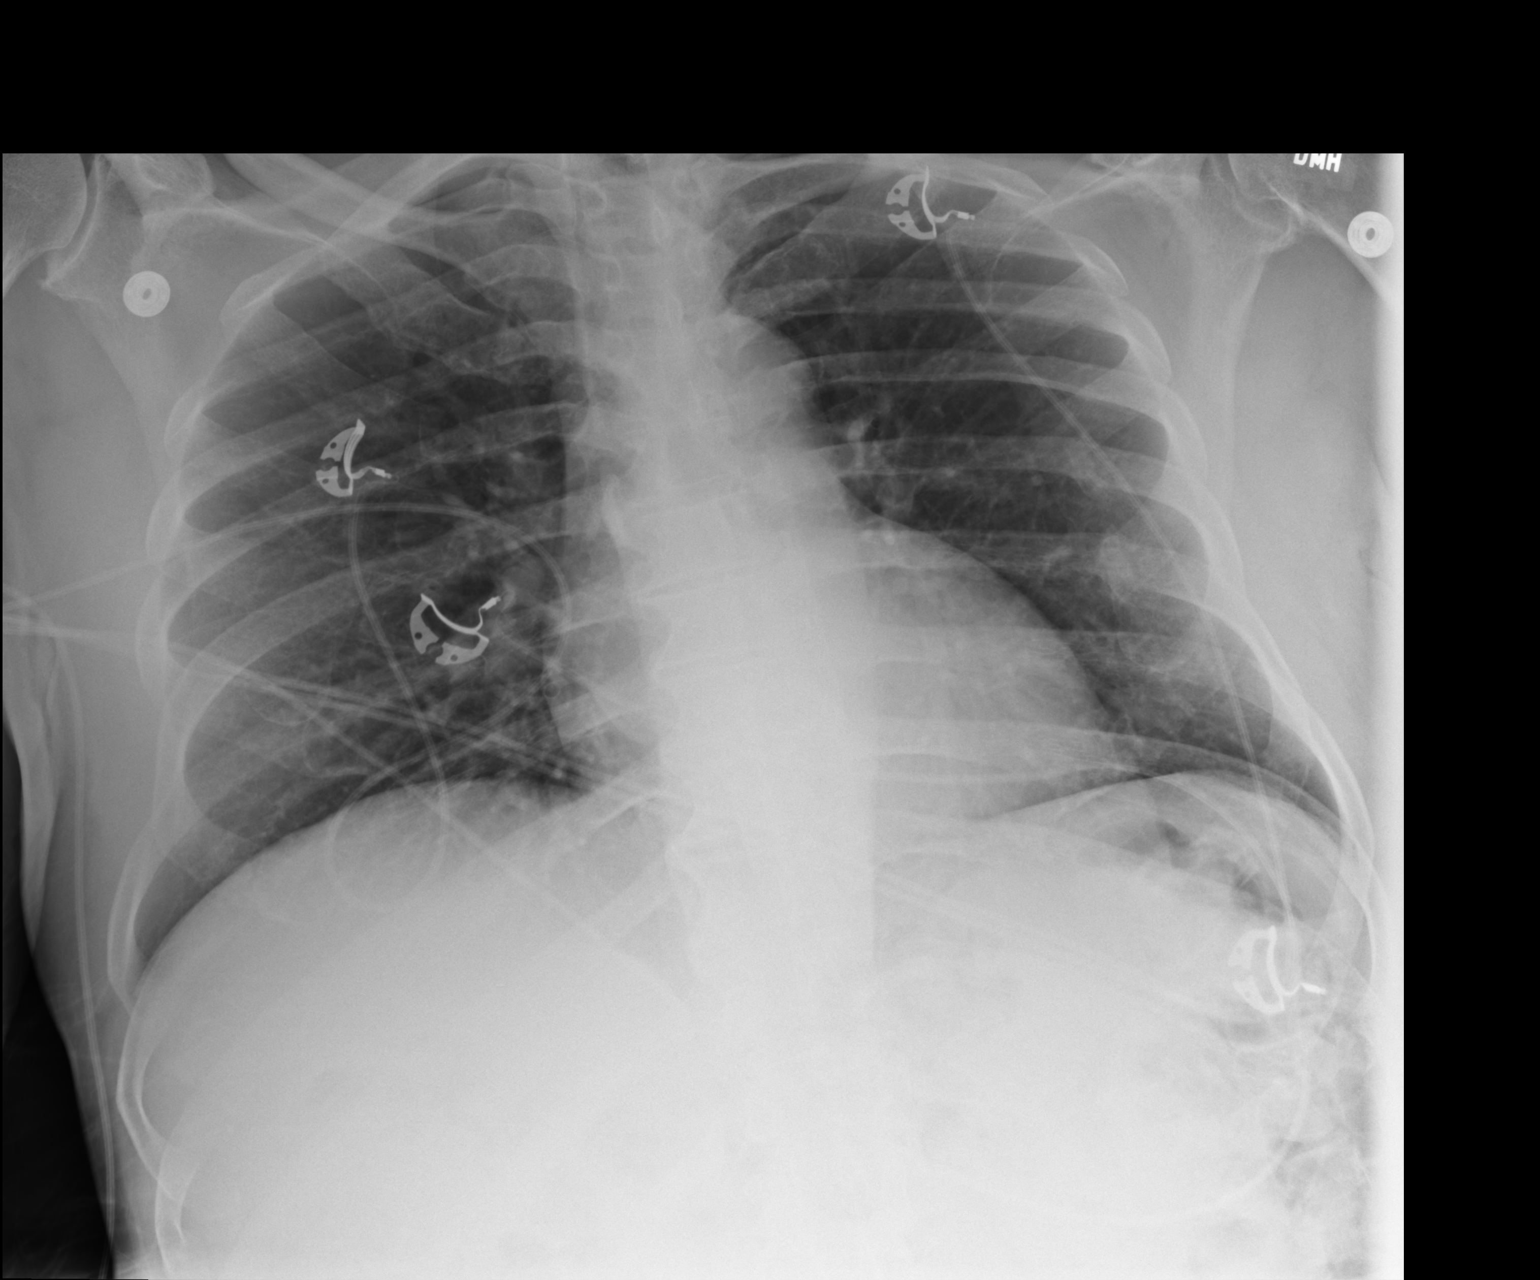

[1 of 1 positions shown; findings below may reference images not displayed]

FINDINGS: The heart size and mediastinal contours are within normal limits.
Both lungs are clear. No pneumothorax or pleural effusion is noted.
Old left rib fractures are noted.
IMPRESSION: No acute cardiopulmonary abnormality seen.

## 2016-11-03 IMAGING — CR DG CHEST 2V
2 series · 2 of 2 positions shown · non-contrast
Comparison: 09/16/2014.

CLINICAL DATA: Medical clearance.  Schizophrenic disorder.

EXAM:
CHEST  2 VIEW

[w chest pa]
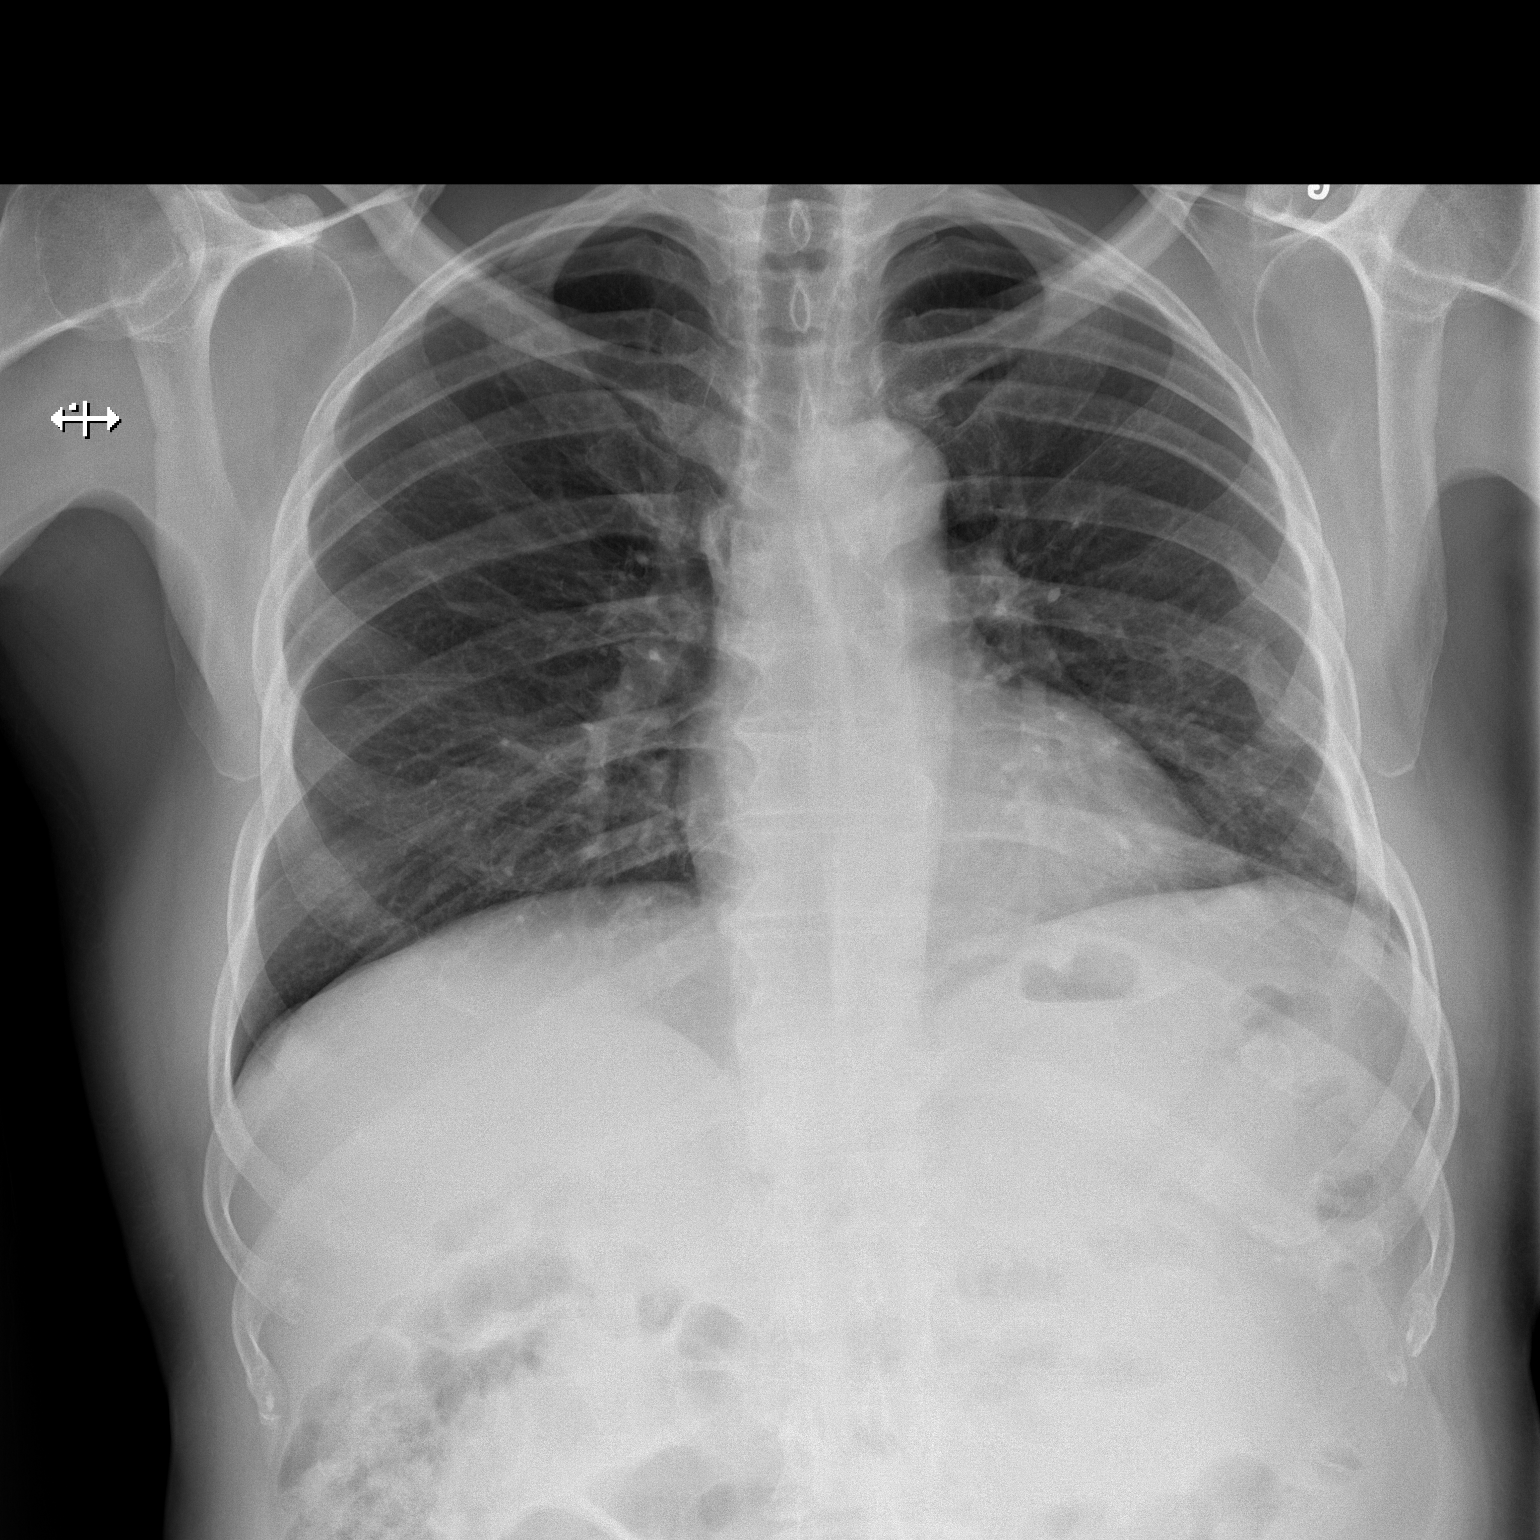

[w chest lat]
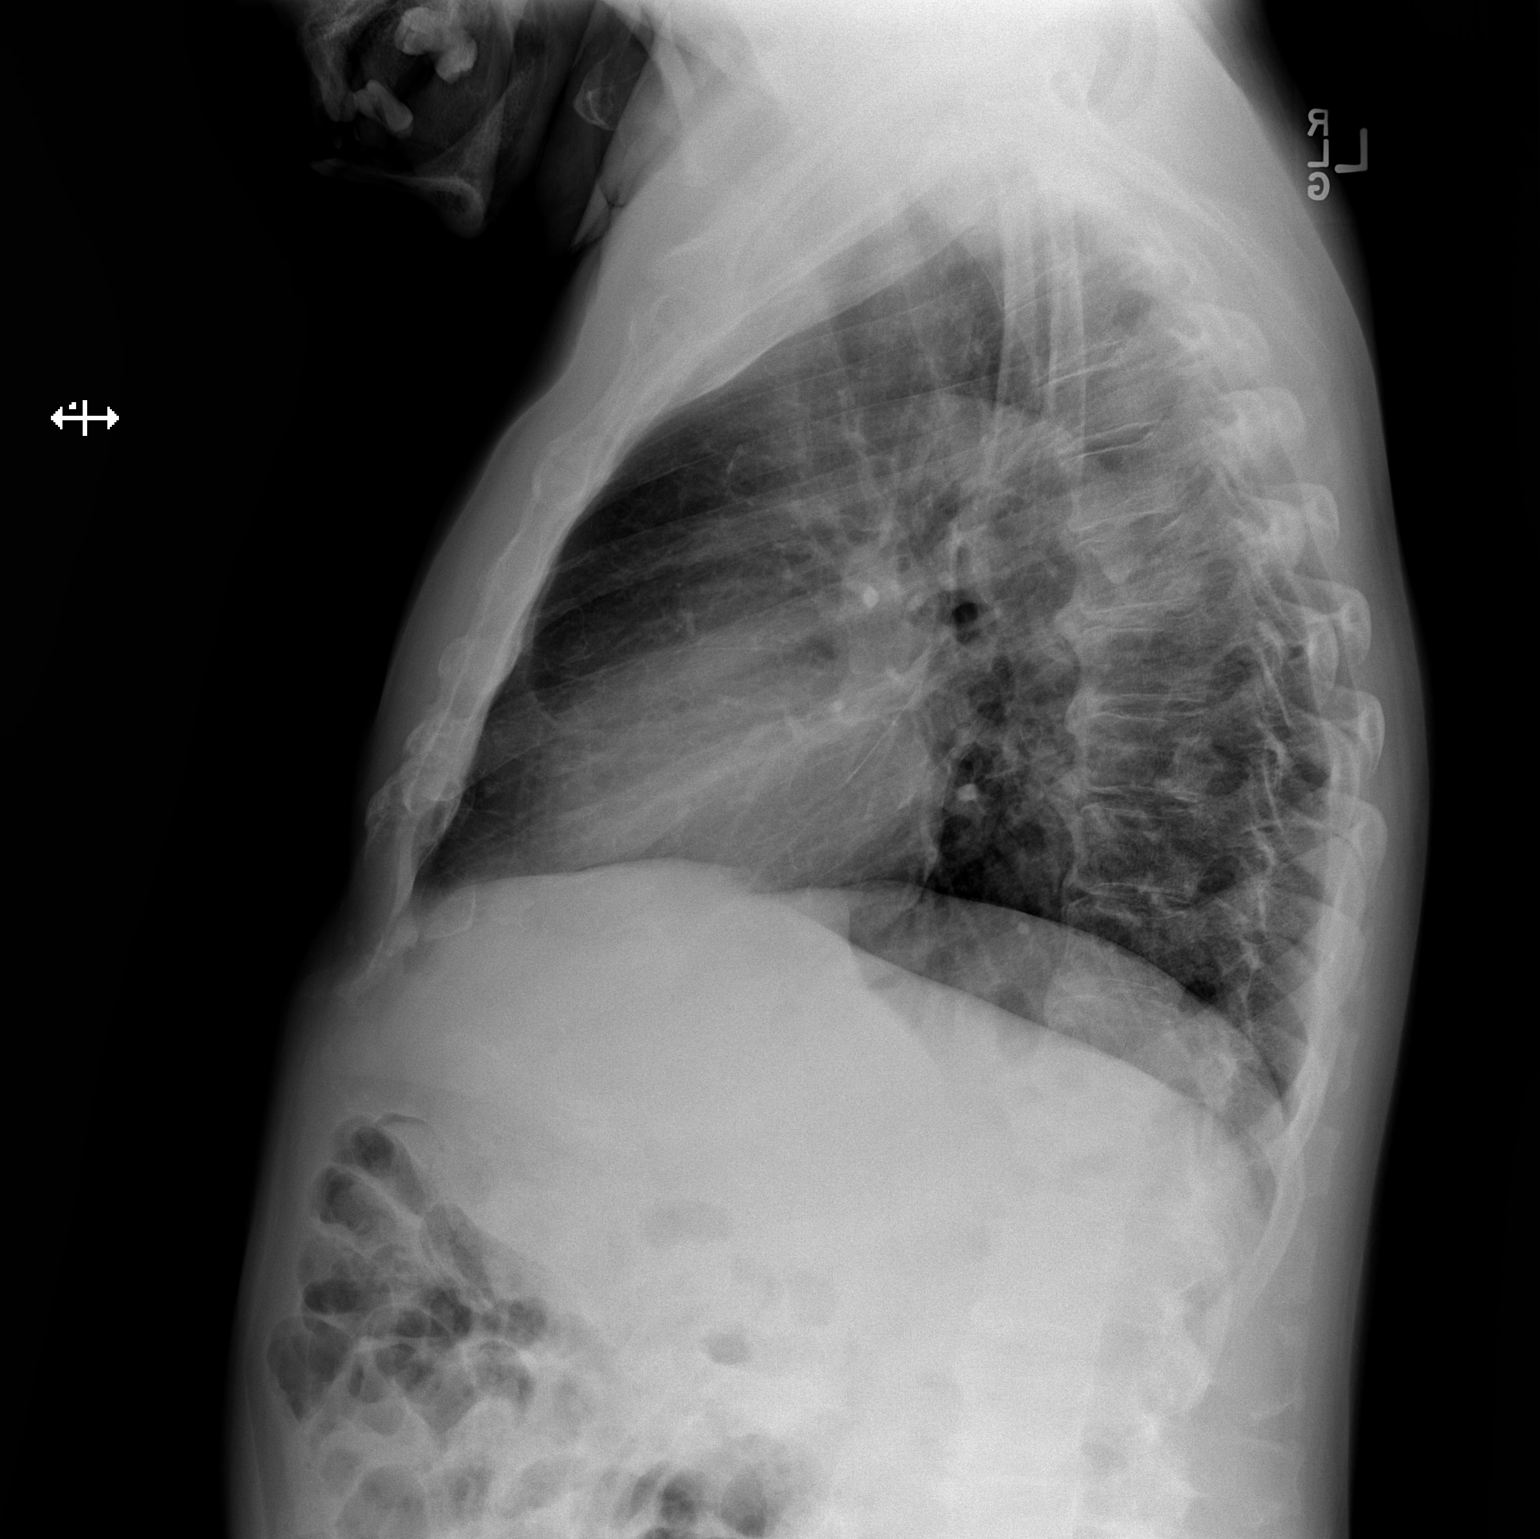

[2 of 2 positions shown; findings below may reference images not displayed]

FINDINGS: The heart size and mediastinal contours are within normal limits.
Both lungs are clear. The visualized skeletal structures are
unremarkable. Stable appearance from priors.
IMPRESSION: No active cardiopulmonary disease.

## 2019-08-22 DEATH — deceased
